# Patient Record
Sex: Female | Born: 1960 | Hispanic: No | Smoking: Current every day smoker
Health system: Southern US, Community
[De-identification: ages and names within clinical notes are randomized; demographics above are authoritative.]

## PROBLEM LIST (undated history)

## (undated) DIAGNOSIS — I10 Essential (primary) hypertension: Secondary | ICD-10-CM

## (undated) DIAGNOSIS — R1314 Dysphagia, pharyngoesophageal phase: Secondary | ICD-10-CM

## (undated) DIAGNOSIS — Z720 Tobacco use: Secondary | ICD-10-CM

## (undated) DIAGNOSIS — G459 Transient cerebral ischemic attack, unspecified: Secondary | ICD-10-CM

## (undated) DIAGNOSIS — E7800 Pure hypercholesterolemia, unspecified: Secondary | ICD-10-CM

## (undated) DIAGNOSIS — U071 COVID-19: Secondary | ICD-10-CM

## (undated) DIAGNOSIS — J449 Chronic obstructive pulmonary disease, unspecified: Secondary | ICD-10-CM

## (undated) DIAGNOSIS — H538 Other visual disturbances: Secondary | ICD-10-CM

## (undated) HISTORY — DX: COVID-19: U07.1

## (undated) HISTORY — PX: OTHER SURGICAL HISTORY: SHX169

## (undated) HISTORY — PX: ECTOPIC PREGNANCY SURGERY: SHX613

## (undated) HISTORY — DX: Chronic obstructive pulmonary disease, unspecified: J44.9

## (undated) HISTORY — DX: Dysphagia, pharyngoesophageal phase: R13.14

## (undated) HISTORY — DX: Other visual disturbances: H53.8

## (undated) HISTORY — PX: CHOLECYSTECTOMY: SHX55

## (undated) HISTORY — DX: Pure hypercholesterolemia, unspecified: E78.00

## (undated) HISTORY — PX: TUBAL LIGATION: SHX77

---

## 1997-07-20 ENCOUNTER — Emergency Department (HOSPITAL_COMMUNITY): Admission: EM | Admit: 1997-07-20 | Discharge: 1997-07-20 | Payer: Self-pay | Admitting: Emergency Medicine

## 1997-09-30 ENCOUNTER — Encounter: Admission: RE | Admit: 1997-09-30 | Discharge: 1997-09-30 | Payer: Self-pay | Admitting: Family Medicine

## 1997-10-09 ENCOUNTER — Encounter: Admission: RE | Admit: 1997-10-09 | Discharge: 1997-10-09 | Payer: Self-pay | Admitting: Family Medicine

## 1997-10-15 ENCOUNTER — Encounter: Admission: RE | Admit: 1997-10-15 | Discharge: 1997-10-15 | Payer: Self-pay | Admitting: Family Medicine

## 1998-06-16 ENCOUNTER — Encounter: Admission: RE | Admit: 1998-06-16 | Discharge: 1998-06-16 | Payer: Self-pay | Admitting: Family Medicine

## 1999-01-23 ENCOUNTER — Encounter: Admission: RE | Admit: 1999-01-23 | Discharge: 1999-01-23 | Payer: Self-pay | Admitting: Family Medicine

## 1999-02-02 ENCOUNTER — Encounter: Admission: RE | Admit: 1999-02-02 | Discharge: 1999-02-02 | Payer: Self-pay | Admitting: Family Medicine

## 1999-02-13 ENCOUNTER — Encounter: Admission: RE | Admit: 1999-02-13 | Discharge: 1999-02-13 | Payer: Self-pay | Admitting: Family Medicine

## 1999-10-16 ENCOUNTER — Encounter: Admission: RE | Admit: 1999-10-16 | Discharge: 1999-10-16 | Payer: Self-pay | Admitting: Family Medicine

## 2000-06-09 ENCOUNTER — Encounter: Admission: RE | Admit: 2000-06-09 | Discharge: 2000-06-09 | Payer: Self-pay | Admitting: Family Medicine

## 2000-06-29 ENCOUNTER — Encounter: Admission: RE | Admit: 2000-06-29 | Discharge: 2000-06-29 | Payer: Self-pay | Admitting: Family Medicine

## 2000-07-20 ENCOUNTER — Encounter: Admission: RE | Admit: 2000-07-20 | Discharge: 2000-07-20 | Payer: Self-pay | Admitting: Family Medicine

## 2000-07-26 ENCOUNTER — Encounter: Admission: RE | Admit: 2000-07-26 | Discharge: 2000-07-26 | Payer: Self-pay | Admitting: Sports Medicine

## 2000-08-10 ENCOUNTER — Encounter: Admission: RE | Admit: 2000-08-10 | Discharge: 2000-08-10 | Payer: Self-pay | Admitting: Family Medicine

## 2000-09-01 ENCOUNTER — Encounter: Admission: RE | Admit: 2000-09-01 | Discharge: 2000-09-01 | Payer: Self-pay | Admitting: Family Medicine

## 2001-01-25 ENCOUNTER — Encounter: Admission: RE | Admit: 2001-01-25 | Discharge: 2001-01-25 | Payer: Self-pay | Admitting: Family Medicine

## 2001-02-04 ENCOUNTER — Encounter (INDEPENDENT_AMBULATORY_CARE_PROVIDER_SITE_OTHER): Payer: Self-pay | Admitting: *Deleted

## 2001-02-04 LAB — CONVERTED CEMR LAB

## 2001-02-06 ENCOUNTER — Encounter: Admission: RE | Admit: 2001-02-06 | Discharge: 2001-02-06 | Payer: Self-pay | Admitting: Family Medicine

## 2001-02-24 ENCOUNTER — Encounter: Admission: RE | Admit: 2001-02-24 | Discharge: 2001-02-24 | Payer: Self-pay | Admitting: Family Medicine

## 2001-04-12 ENCOUNTER — Encounter: Admission: RE | Admit: 2001-04-12 | Discharge: 2001-04-12 | Payer: Self-pay | Admitting: Family Medicine

## 2001-08-29 ENCOUNTER — Encounter: Payer: Self-pay | Admitting: Sports Medicine

## 2001-08-29 ENCOUNTER — Encounter: Admission: RE | Admit: 2001-08-29 | Discharge: 2001-08-29 | Payer: Self-pay | Admitting: Sports Medicine

## 2001-10-29 ENCOUNTER — Emergency Department (HOSPITAL_COMMUNITY): Admission: EM | Admit: 2001-10-29 | Discharge: 2001-10-29 | Payer: Self-pay | Admitting: Emergency Medicine

## 2003-05-15 ENCOUNTER — Encounter: Admission: RE | Admit: 2003-05-15 | Discharge: 2003-05-15 | Payer: Self-pay | Admitting: Sports Medicine

## 2003-05-15 ENCOUNTER — Encounter: Admission: RE | Admit: 2003-05-15 | Discharge: 2003-05-15 | Payer: Self-pay | Admitting: Family Medicine

## 2003-05-22 ENCOUNTER — Encounter: Admission: RE | Admit: 2003-05-22 | Discharge: 2003-05-22 | Payer: Self-pay | Admitting: Sports Medicine

## 2004-04-03 ENCOUNTER — Emergency Department (HOSPITAL_COMMUNITY): Admission: EM | Admit: 2004-04-03 | Discharge: 2004-04-03 | Payer: Self-pay | Admitting: Emergency Medicine

## 2004-04-21 ENCOUNTER — Ambulatory Visit: Payer: Self-pay | Admitting: Internal Medicine

## 2006-03-04 ENCOUNTER — Encounter (INDEPENDENT_AMBULATORY_CARE_PROVIDER_SITE_OTHER): Payer: Self-pay | Admitting: *Deleted

## 2006-11-11 ENCOUNTER — Ambulatory Visit: Payer: Self-pay | Admitting: Family Medicine

## 2006-11-11 DIAGNOSIS — J209 Acute bronchitis, unspecified: Secondary | ICD-10-CM | POA: Insufficient documentation

## 2006-11-17 ENCOUNTER — Encounter (INDEPENDENT_AMBULATORY_CARE_PROVIDER_SITE_OTHER): Payer: Self-pay | Admitting: Family Medicine

## 2008-01-30 ENCOUNTER — Encounter: Payer: Self-pay | Admitting: Family Medicine

## 2008-01-30 ENCOUNTER — Ambulatory Visit: Payer: Self-pay | Admitting: Family Medicine

## 2008-01-30 DIAGNOSIS — N912 Amenorrhea, unspecified: Secondary | ICD-10-CM | POA: Insufficient documentation

## 2008-01-30 DIAGNOSIS — I10 Essential (primary) hypertension: Secondary | ICD-10-CM | POA: Insufficient documentation

## 2008-01-30 LAB — CONVERTED CEMR LAB: Beta hcg, urine, semiquantitative: NEGATIVE

## 2008-01-31 ENCOUNTER — Encounter: Payer: Self-pay | Admitting: Family Medicine

## 2008-01-31 LAB — CONVERTED CEMR LAB
BUN: 9 mg/dL (ref 6–23)
CO2: 22 meq/L (ref 19–32)
Calcium: 9.4 mg/dL (ref 8.4–10.5)
Chloride: 111 meq/L (ref 96–112)
Creatinine, Ser: 0.61 mg/dL (ref 0.40–1.20)
Glucose, Bld: 91 mg/dL (ref 70–99)
Potassium: 4.3 meq/L (ref 3.5–5.3)
Sodium: 144 meq/L (ref 135–145)

## 2008-02-05 ENCOUNTER — Encounter: Admission: RE | Admit: 2008-02-05 | Discharge: 2008-02-05 | Payer: Self-pay | Admitting: Family Medicine

## 2008-02-05 ENCOUNTER — Encounter: Payer: Self-pay | Admitting: Family Medicine

## 2008-02-06 ENCOUNTER — Telehealth: Payer: Self-pay | Admitting: Family Medicine

## 2008-02-08 ENCOUNTER — Encounter: Payer: Self-pay | Admitting: Family Medicine

## 2008-03-01 ENCOUNTER — Ambulatory Visit: Payer: Self-pay | Admitting: Family Medicine

## 2008-03-01 DIAGNOSIS — F17200 Nicotine dependence, unspecified, uncomplicated: Secondary | ICD-10-CM | POA: Insufficient documentation

## 2008-05-31 ENCOUNTER — Encounter (INDEPENDENT_AMBULATORY_CARE_PROVIDER_SITE_OTHER): Payer: Self-pay | Admitting: Neurology

## 2008-05-31 ENCOUNTER — Emergency Department (HOSPITAL_COMMUNITY): Admission: EM | Admit: 2008-05-31 | Discharge: 2008-05-31 | Payer: Self-pay | Admitting: Emergency Medicine

## 2008-05-31 ENCOUNTER — Ambulatory Visit: Payer: Self-pay | Admitting: Family Medicine

## 2008-05-31 ENCOUNTER — Telehealth: Payer: Self-pay | Admitting: Family Medicine

## 2008-05-31 ENCOUNTER — Ambulatory Visit: Payer: Self-pay | Admitting: Cardiology

## 2008-06-04 ENCOUNTER — Encounter: Payer: Self-pay | Admitting: *Deleted

## 2008-06-04 ENCOUNTER — Encounter: Payer: Self-pay | Admitting: Family Medicine

## 2008-06-04 ENCOUNTER — Ambulatory Visit: Payer: Self-pay | Admitting: Family Medicine

## 2008-06-04 ENCOUNTER — Telehealth: Payer: Self-pay | Admitting: Family Medicine

## 2008-06-04 DIAGNOSIS — G459 Transient cerebral ischemic attack, unspecified: Secondary | ICD-10-CM | POA: Insufficient documentation

## 2008-06-04 DIAGNOSIS — E669 Obesity, unspecified: Secondary | ICD-10-CM | POA: Insufficient documentation

## 2008-06-04 LAB — CONVERTED CEMR LAB
Cholesterol: 181 mg/dL (ref 0–200)
HDL: 43 mg/dL (ref >39)
Hgb A1c MFr Bld: 5.8 %
LDL Cholesterol: 110 mg/dL — ABNORMAL HIGH (ref 0–99)
Total CHOL/HDL Ratio: 4.2
Triglycerides: 142 mg/dL (ref <150)
VLDL: 28 mg/dL (ref 0–40)

## 2008-06-10 ENCOUNTER — Encounter: Payer: Self-pay | Admitting: Family Medicine

## 2008-07-16 ENCOUNTER — Ambulatory Visit: Payer: Self-pay | Admitting: Family Medicine

## 2008-07-16 DIAGNOSIS — M25519 Pain in unspecified shoulder: Secondary | ICD-10-CM | POA: Insufficient documentation

## 2008-12-30 ENCOUNTER — Ambulatory Visit: Payer: Self-pay | Admitting: Family Medicine

## 2008-12-30 ENCOUNTER — Encounter: Admission: RE | Admit: 2008-12-30 | Discharge: 2008-12-30 | Payer: Self-pay | Admitting: Family Medicine

## 2008-12-30 DIAGNOSIS — R059 Cough, unspecified: Secondary | ICD-10-CM | POA: Insufficient documentation

## 2008-12-30 DIAGNOSIS — R05 Cough: Secondary | ICD-10-CM

## 2009-01-13 ENCOUNTER — Encounter: Payer: Self-pay | Admitting: Family Medicine

## 2009-01-13 ENCOUNTER — Ambulatory Visit: Payer: Self-pay | Admitting: Family Medicine

## 2009-01-13 LAB — CONVERTED CEMR LAB: Direct LDL: 131 mg/dL — ABNORMAL HIGH

## 2009-01-19 ENCOUNTER — Encounter: Payer: Self-pay | Admitting: Family Medicine

## 2009-05-27 ENCOUNTER — Ambulatory Visit: Payer: Self-pay | Admitting: Family Medicine

## 2009-05-27 ENCOUNTER — Encounter: Payer: Self-pay | Admitting: Sports Medicine

## 2009-05-28 LAB — CONVERTED CEMR LAB
HCT: 43.6 % (ref 36.0–46.0)
Hemoglobin: 14.1 g/dL (ref 12.0–15.0)
INR: 1.02 (ref <1.50)
MCHC: 32.3 g/dL (ref 30.0–36.0)
MCV: 94 fL (ref 78.0–100.0)
Platelets: 319 10*3/uL (ref 150–400)
Prothrombin Time: 13.3 s (ref 11.6–15.2)
RBC: 4.64 M/uL (ref 3.87–5.11)
RDW: 14.9 % (ref 11.5–15.5)
TSH: 1.468 microintl units/mL (ref 0.350–4.500)
WBC: 7.4 10*3/uL (ref 4.0–10.5)
aPTT: 31 s

## 2009-06-03 ENCOUNTER — Encounter: Payer: Self-pay | Admitting: Family Medicine

## 2009-06-06 ENCOUNTER — Telehealth: Payer: Self-pay | Admitting: Family Medicine

## 2009-06-09 ENCOUNTER — Ambulatory Visit: Payer: Self-pay | Admitting: Family Medicine

## 2009-06-16 ENCOUNTER — Encounter: Payer: Self-pay | Admitting: Family Medicine

## 2009-07-02 ENCOUNTER — Ambulatory Visit: Payer: Self-pay | Admitting: Family Medicine

## 2009-07-02 DIAGNOSIS — N926 Irregular menstruation, unspecified: Secondary | ICD-10-CM | POA: Insufficient documentation

## 2010-01-25 ENCOUNTER — Encounter: Payer: Self-pay | Admitting: Family Medicine

## 2010-01-28 ENCOUNTER — Encounter: Payer: Self-pay | Admitting: Family Medicine

## 2010-01-28 ENCOUNTER — Ambulatory Visit: Admission: RE | Admit: 2010-01-28 | Discharge: 2010-01-28 | Payer: Self-pay | Source: Home / Self Care

## 2010-01-28 ENCOUNTER — Other Ambulatory Visit: Payer: Self-pay | Admitting: Family Medicine

## 2010-01-28 ENCOUNTER — Telehealth: Payer: Self-pay | Admitting: Family Medicine

## 2010-01-28 DIAGNOSIS — R5381 Other malaise: Secondary | ICD-10-CM | POA: Insufficient documentation

## 2010-01-28 DIAGNOSIS — R5383 Other fatigue: Secondary | ICD-10-CM

## 2010-01-28 DIAGNOSIS — G4730 Sleep apnea, unspecified: Secondary | ICD-10-CM | POA: Insufficient documentation

## 2010-01-28 LAB — CONVERTED CEMR LAB
HCT: 41.9 % (ref 36.0–46.0)
Hemoglobin: 14.4 g/dL (ref 12.0–15.0)
MCHC: 34.4 g/dL (ref 30.0–36.0)
MCV: 92.9 fL (ref 78.0–100.0)
Platelets: 319 10*3/uL (ref 150–400)
RBC: 4.51 M/uL (ref 3.87–5.11)
RDW: 14.5 % (ref 11.5–15.5)
WBC: 6.8 10*3/uL (ref 4.0–10.5)

## 2010-01-29 ENCOUNTER — Telehealth: Payer: Self-pay | Admitting: *Deleted

## 2010-01-30 ENCOUNTER — Telehealth: Payer: Self-pay | Admitting: *Deleted

## 2010-02-02 ENCOUNTER — Telehealth: Payer: Self-pay | Admitting: *Deleted

## 2010-02-03 NOTE — Assessment & Plan Note (Signed)
Summary: f/u last visit/eo   Vital Signs:  Patient profile:   50 year old female Height:      60.0 inches Weight:      162.6 pounds BMI:     31.87 Temp:     98.1 degrees F Pulse rate:   83 / minute BP sitting:   123 / 82  (left arm)  Vitals Entered By: Starleen Blue RN (July 16, 2008 2:26 PM) CC: htn, shoulder pain Is Patient Diabetic? No Pain Assessment Patient in pain? no        Primary Care Provider:  Jamie Brookes  CC:  htn and shoulder pain.  History of Present Illness: hypertension: Pt has had some elevated BP's and has recently had a TIA. She came in to discuss her blood pressure and cholesterol. She has been taking daily BP's and has ranged between SBP of 112-166. Most of her BP's are in the 120's. SHe says that she is not eating any red meat, takes a multi-vitamin, Vit D, Vit E, and fish oil. She uses apedometer occasionally and knows that she gets more than 10,000 steps a day when she works (which is anywhere from 3-7 days a week). Pt is very serious about not wanting to be on medications. She would like to try a natural way to reduce her cholesterol. I mentioned Red Yeast Rice to her and she will consider taking it.    Should pain: Pt mentions that she has been sleeping on her shoulder and it has been hurting a lot. Explained to patient that sleeping on her back is the best way to keep from putting too much pressure on any one body part. Encouraged pt to use ibuprofen when aching.   Smoking: Counseled pt to cut down smoking and eventualy stop completely. Pt has decreased her smoking some but is not interested in stopping at this time. Offered support.   Habits & Providers  Alcohol-Tobacco-Diet     Tobacco Status: current     Cigarette Packs/Day: 0.25  Current Medications (verified): 1)  Multivitamins  Caps (Multiple Vitamin) .[name].. 1 Daily 2)  Vitamin D 1000 Unit Tabs (Cholecalciferol) .... Take Once Daily 3)  Calcium 500 Mg Chew (Calcium Carbonate) .... Take  800mg  Daily  Allergies (verified): No Known Drug Allergies  Past History:  Past Medical History: Last updated: 06/04/2008 copd exacerbations, [id number], multiple clinic visits for bronchitis,  TIA 05-31-08, ECHO wnl, MRI wnl, carotid dopples ?  Social History: Last updated: 01/30/2008 works as a Financial risk analyst; 1-3 beer/wk; joint/day; 47 pack year; 1/2 ppd  Risk Factors: Smoking Status: current (07/16/2008) Packs/Day: 0.25 (07/16/2008)  Social History: Packs/Day:  0.25  Review of Systems       neg except as noted in HPI  Physical Exam  General:  Well-developed,well-nourished,in no acute distress; alert,appropriate and cooperative throughout examination Lungs:  Normal respiratory effort, chest expands symmetrically. Lungs are clear to auscultation, no crackles or wheezes. Heart:  Normal rate and regular rhythm. S1 and S2 normal without gallop, murmur, click, rub or other extra sounds. Psych:  Cognition and judgment appear intact. Alert and cooperative with normal attention span and concentration. No apparent delusions, illusions, hallucinations   Impression & Recommendations:  Problem # 1:  ELEVATED BP READING WITHOUT DX HYPERTENSION (ICD-796.2) Assessment Improved PT has had better blood pressures since our last visit. She wants to further decrease her risk of CVD by getting her cholesterol down to 70. SHe would like to do this naturally. Suggested REd Yeast Rice. Will plan  to recheck cholesterol in 2 months. If not better plan to start pt on statin. Cont taking BP's.  Orders: [facility]- Est  Level 4 ([address]109)  Problem # 2:  SHOULDER PAIN, RIGHT (ICD-719.41) Assessment: New Pt is having new shoulder pain. She has been sleeping on her arm. Suggested sleeping on back and taking motrin. PT still has range of motion but has pain when she lifts her arms above her head.   Orders: [facility]- Est  Level 4 ([address]454)  Complete Medication List: 1)  Multivitamins Caps (Multiple vitamin) .[name].. 1 daily 2)   Vitamin D 1000 Unit Tabs (Cholecalciferol) .... Take once daily 3)  Calcium 500 Mg Chew (Calcium carbonate) .... Take 800mg  daily  Patient Instructions: 1)  I will put in an order for a fasting lipid panal in 2 months. 2)  Please make a lab appointment for 2 months. 3)  Try Red Yeast Rice to lower cholesterol natrually since you are not interested in meds at this time. 4)  I will call with results.

## 2010-02-03 NOTE — Letter (Signed)
Summary: Generic Letter  [facility]  [address]   [place], [place] [address]   Phone: [phone]  Fax: [phone]    02/05/2008  LIND AUSLEY [address] [place], [place]  [address]  Dear Ms. Ohanian,  It was nice to meet you last week. All your lab results came back normal. Your pap smear showed no signs of cancer cells. I hope you have a wonderful week.    Sincerely,   AMBER STROTHER MD [facility]

## 2010-02-03 NOTE — Progress Notes (Signed)
Summary: triage  Phone Note Call from Patient Call back at Home Phone [phone] Call back at [phone] ext [phone]   Caller: Patient Summary of Call: Pt was given medication to stop bleeding it did work but now she is bleeding again. Initial call taken by: Clydell Hakim,  June 06, 2009 3:39 PM  Follow-up for Phone Call        the provera stopped it but she just started bleeding again 45 min ago. not heavy flow. does not want to go to UC as co pay is too high. will hold off until Monday at 8:30. has to be at work at 10. advised lots of fluids and red meat and rest. she is off this weekend. go [facility] if the bleeding is enough to saturate a pad every 45 minutes or she developes severe pain. she agreed with plan Follow-up by: Golden Circle RN,  June 06, 2009 3:52 PM  Additional Follow-up for Phone Call Additional follow up Details #1::        thanks for getting her seen today.  Additional Follow-up by: Jamie Brookes MD,  June 09, 2009 4:37 PM

## 2010-02-03 NOTE — Miscellaneous (Signed)
Summary: [facility] ultrasound  Clinical Lists Changes Diane from [facility] called to report that pt dnka her appt for an ultrasound...Marland KitchenMarland KitchenGolden Circle RN  Jun 03, 2009 4:37 PM  Spoke with patient. she completely forgot about the appointment. She has quit bleeding. I recommended that she still have the ultrasound. She gets her new schedule tomorrow. She will be able to know when she can go in after 10 am tomorrow. I will have our office call her in the am. Jamie Brookes MD  Jun 03, 2009 7:07 PM    Appended Document: [facility] ultrasound left vm for pt to call back

## 2010-02-03 NOTE — Assessment & Plan Note (Signed)
Summary: bronchitis wp   Vital Signs:  Patient Profile:   50 Years Old Female Height:     60 inches Weight:      164.2 pounds BMI:     32.18 Temp:     98 degrees F Pulse rate:   101 / minute BP sitting:   155 / 88  Pt. in pain?   no  Vitals Entered By: Ishmael Holter                  Chief Complaint:  cough x 2 weeks.  History of Present Illness: Bronchitis for 2 weeks, still smokiing 1 PPD with no plans on quiting or slowing down.  Wheezing, cannot afford meds, no health insurance.  Can go to [facility].  Deneis fever, + sputum production.        Risk Factors:  Tobacco use:  current    Counseled to quit/cut down tobacco use:  yes    Physical Exam  General:     Coughing non stop, hoarse quality to voice, pale Ears:     R ear normal and L ear normal.   Mouth:     poor dentition.   Lungs:     R wheezes and L wheezes.   Heart:     normal rate, regular rhythm, and no murmur.      Impression & Recommendations:  Problem # 1:  ACUTE BRONCHITIS (ICD-466.0) Likely chronic problem with tobacco use and viral infection causing acute flair.  Gave albuterol neb,  wrote for [facility] but she doubted she could afford to fill.  Prednisone 40 mg daily for 5 days, doxycycline 100 two times a day for 10 days. Orders: [facility]- Est Level  3 ([address]109)    Patient Instructions: 1)  Tobacco is very bad for your health and your loved ones! You Should stop smoking!. 2)  Stop Smoking Tips: Choose a Quit date. Cut down before the Quit date. decide what you will do as a substitute when you feel the urge to smoke(gum,toothpick,exercise).    ]

## 2010-02-03 NOTE — Assessment & Plan Note (Signed)
Summary: cough x 1 month   Vital Signs:  Patient profile:   50 year old female Weight:      160.5 pounds Temp:     97.6 degrees F oral Pulse rate:   79 / minute BP sitting:   125 / 79  (right arm) Cuff size:   regular  Vitals Entered By: Loralee Pacas CMA (December 30, 2008 1:31 PM) CC: coughing, stopped up, fatigued ears and throat scratchy x 2 months   Primary Care Provider:  Jamie Brookes  CC:  coughing, stopped up, and fatigued ears and throat scratchy x 2 months.  History of Present Illness: Pt has been having a cough, congestion and fatigue since just before Thanksgiving. Her husband was sick and she caught his cold and has not gotten rid of it since. She continues to smoke about 7-10 cigarettes per day and says she has had bronchitis inections before. She has also been on prednisone and inhalers because of COPD in the past. She says she has been having some chills and sweating but has not docmented a fever. She says her coughing is so forceful that it makes her urinate. She is sore and achy and has missed 3 days of work because of her fatigue. She says her mucus has been clear from her nose and from her cough.     Habits & Providers  Alcohol-Tobacco-Diet     Tobacco Status: current     Tobacco Counseling: to quit use of tobacco products     Cigarette Packs/Day: 7-10 cigs per day  Current Medications (verified): 1)  Multivitamins  Caps (Multiple Vitamin) .[name].. 1 Daily 2)  Vitamin D 1000 Unit Tabs (Cholecalciferol) .... Take Once Daily 3)  Calcium 500 Mg Chew (Calcium Carbonate) .... Take 800mg  Daily  Allergies (verified): No Known Drug Allergies  Social History: Packs/Day:  7-10 cigs per day  Review of Systems        vitals reviewed and pertinent negatives and positives seen in HPI   Physical Exam  General:  Well-developed,well-nourished,in no acute distress; alert,appropriate and cooperative throughout examination Head:  pt reports no tenderness or tooth  pain.  Mouth:  Oral mucosa and oropharynx without lesions or exudates.  Teeth in fair repair.  Neck:  No deformities, masses, or tenderness noted. Lungs:  diffuse exp wheezing- worse in post lung fields, no crackles, normal resp effort Heart:  Normal rate and regular rhythm. S1 and S2 normal without gallop, murmur, click, rub or other extra sounds.   Impression & Recommendations:  Problem # 1:  COUGH (ICD-786.2) Assessment New Pt has had a cough for a little over a month. Her CXR did not show any lesions. Discussed with patient and she would prefer to wait a few more days to see if she improves. If she starts to get worse she will call the office.   Orders: CXR- 2view (CXR) [facility]- Est Level  3 (16109)  Problem # 2:  TOBACCO ABUSE (ICD-305.1) Assessment: Improved Pt is now only smoking 7-10 cigarettes a day. She has cut back but continues to have difficulty quitting because she says her husband smokes around her. Knows she needs to someday but is not at the action stage yet.   Orders: [facility]- Est Level  3 (60454)  Complete Medication List: 1)  Multivitamins Caps (Multiple vitamin) .[name].. 1 daily 2)  Vitamin D 1000 Unit Tabs (Cholecalciferol) .... Take once daily 3)  Calcium 500 Mg Chew (Calcium carbonate) .... Take 800mg  daily  Patient Instructions: 1)  I could be that this is all a virus causing your problems in which case lots of water and rest will be the only things to impove it.  2)  I could also be an atypical pneumonia or COPD. Let's get an x-ray to find out.  3)  I will call you with results.

## 2010-02-03 NOTE — Assessment & Plan Note (Signed)
Summary: continued bleeding/[place]/strother   Vital Signs:  Patient profile:   50 year old female Weight:      165.6 pounds Pulse rate:   65 / minute BP sitting:   126 / 81  (right arm)  Vitals Entered By: Arlyss Repress CMA, (June 09, 2009 8:29 AM) CC: f/up irregular menses. Is Patient Diabetic? No Pain Assessment Patient in pain? no        Primary Care Provider:  Jamie Brookes  CC:  f/up irregular menses..  History of Present Illness: Patient was seen 5/24 for prolonged mensese (14 days).  Given 7 day course of provera, which she strted that day.  On day 3 her bleeding stopped.  Approx 3-4 days after finishing the course, she started to bleed again (on Friday).  Light flow.  Called Fri for appt but couldn't be seen then so made appt for today (Monday).  Bleeding stopped by Saturday morning and has had no further bleeding.  No complaints today.  States appt was too early in the morning for her to cancel or she wouuld have as she has no problems or complaints now.  Labs checked at the 5/24 visit were reviewed with the patient - all normal.   Habits & Providers  Alcohol-Tobacco-Diet     Tobacco Status: current     Tobacco Counseling: to quit use of tobacco products     Cigarette Packs/Day: 0.5  Allergies: No Known Drug Allergies  Physical Exam  General:  overweight, alert, NAD vitals reviewed Psych:  Oriented X3, memory intact for recent and remote, and normally interactive.     Impression & Recommendations:  Problem # 1:  DYSFUNCTIONAL UTERINE BLEEDING (ICD-626.8) Assessment Improved  Resolved. Slight bleeding likely from the cessation of the provera.  Discussed bleeding often becomes irregular around menopause.  Due for CPE and pap.  Will schedule at her earliest convenience with her PCP.   Orders: [facility]- Est Level  3 ([address])  Complete Medication List: 1)  Multivitamins Caps (Multiple vitamin) .[name].. 1 daily 2)  Vitamin D 1000 Unit Tabs (Cholecalciferol) .... Take  once daily 3)  Calcium 500 Mg Chew (Calcium carbonate) .... Take 800mg  daily 4)  Provera 10 Mg Tabs (Medroxyprogesterone acetate) .... One tab by mouth daily x 7 days

## 2010-02-03 NOTE — Assessment & Plan Note (Signed)
Summary: prolonged mentral,tcb   Vital Signs:  Patient profile:   50 year old female Weight:      160.4 pounds Temp:     98.1 degrees F oral Pulse rate:   83 / minute Pulse rhythm:   regular BP sitting:   117 / 82  (left arm) Cuff size:   regular  Vitals Entered By: Loralee Pacas CMA (May 27, 2009 1:32 PM) CC: still bleeding Is Patient Diabetic? No Comments pt states that she has been bleeding for 14 days   Primary Care Provider:  Jamie Brookes  CC:  still bleeding.  History of Present Illness: Bleeding for 14 days, usual is 7, on and off but some blood/clots passed at least daily, no pain, no dizziness/SOB/presyncope, no fevers/chills, no dysuria, s/p BTL.  1-2 pads a day.  No cramping.  Starting to get hot flashes, mood changes.  No aspirin/motrin use.  No hx bleeding or clotting problems.  Habits & Providers  Alcohol-Tobacco-Diet     Tobacco Status: current     Tobacco Counseling: to quit use of tobacco products     Cigarette Packs/Day: 0.5  Current Medications (verified): 1)  Multivitamins  Caps (Multiple Vitamin) .[name].. 1 Daily 2)  Vitamin D 1000 Unit Tabs (Cholecalciferol) .... Take Once Daily 3)  Calcium 500 Mg Chew (Calcium Carbonate) .... Take 800mg  Daily 4)  Provera 10 Mg Tabs (Medroxyprogesterone Acetate) .... One Tab By Mouth Daily X 7 Days  Allergies (verified): No Known Drug Allergies  Past History:  Past Medical History: Last updated: 06/04/2008 copd exacerbations, [id number], multiple clinic visits for bronchitis,  TIA 05-31-08, ECHO wnl, MRI wnl, carotid dopples ?  Social History: Packs/Day:  0.5  Review of Systems       See HPI  Physical Exam  General:  Well-developed,well-nourished,in no acute distress; alert,appropriate and cooperative throughout examination Lungs:  Normal respiratory effort, chest expands symmetrically. Lungs are clear to auscultation, no crackles or wheezes. Heart:  Normal rate and regular rhythm. S1 and S2 normal without  gallop, murmur, click, rub or other extra sounds. Abdomen:  Bowel sounds positive,abdomen soft and non-tender without masses, organomegaly or hernias noted. Genitalia:  Normal introitus for age, no external lesions, no vaginal discharge, mucosa pink and moist, no vaginal or cervical lesions, no vaginal atrophy, no friaility or hemorrhage, 20 WEEK UTERUS that is very firm, no adnexal masses or tenderness  Some blood in vault/clots, but very little. Extremities:  No clubbing, cyanosis, edema, or deformity noted with normal full range of motion of all joints.   Skin:  Intact without suspicious lesions or rashes Psych:  Cognition and judgment appear intact. Alert and cooperative with normal attention span and concentration. No apparent delusions, illusions, hallucinations   Impression & Recommendations:  Problem # 1:  DYSFUNCTIONAL UTERINE BLEEDING (ICD-626.8) Assessment New Checking the below labs, very large/firm uterus without hx fibroids.  Checking [place].  Provera 10mg  qd x 7d to stop bleeding.  Orders: CBC-[facility] ([address]147) TSH-[facility] ([address]2956-21308) PTT-[facility] ([id number]) INR/PT-[facility] (52841) [facility]- Est  Level 4 (32440) Ultrasound (Ultrasound)  Complete Medication List: 1)  Multivitamins Caps (Multiple vitamin) .[name].. 1 daily 2)  Vitamin D 1000 Unit Tabs (Cholecalciferol) .... Take once daily 3)  Calcium 500 Mg Chew (Calcium carbonate) .... Take 800mg  daily 4)  Provera 10 Mg Tabs (Medroxyprogesterone acetate) .... One tab by mouth daily x 7 days  Patient Instructions: 1)  Labs 2)  Ultrasound 3)  Provera daily for 7 days 4)  Come back if no improvement. 5)  -  Dr. Karie Schwalbe. Prescriptions: PROVERA 10 MG TABS (MEDROXYPROGESTERONE ACETATE) One tab by mouth daily x 7 days  #7 x 0   Entered and Authorized by:   Rodney Langton MD   Signed by:   Rodney Langton MD on 05/27/2009   Method used:   Electronically to        [facility] [phone]* (retail)       [address]       [place],  [place]  [address]       Ph: [phone]       Fax: [phone]   RxID:   [id number]

## 2010-02-03 NOTE — Letter (Signed)
Summary: Results Follow-up Letter  [facility]  [address]   [place], [place] [address]   Phone: [phone]  Fax: [phone]    06/10/2008  [address] [place], [place]  [address]  Dear Ms. Karel,   The following are the results of your recent test(s):  Test            Result      Cholesterol LDL(Bad cholesterol):    110     Your goal is less than: less than 70       HDL (Good cholesterol):   43     Your goal is more than: more than 40   Your cholesterol goal is less than most people because you have had that TIA (or mini-stroke). Eating a diet low is saturated fat (butter, meat, dairy, etc...) and high in vegetables combined with stopping to smoke will help you to decrease your cholesterol and overall vascular health. You don't have to start a medicine at this point but I would recommend starting a Fish Oil supplement for lowering your LDL cholesterol and decreasing inflamation. Call our office your make an appointment if you have any questions.    Sincerely,  Jamie Brookes MD [facility]           Appended Document: Results Follow-up Letter Letter sent via mail to pt ..................Marland KitchenDelores Pate-Gaddy, CMA (AAMA)

## 2010-02-03 NOTE — Letter (Signed)
Summary: Results Follow Up Letter  [facility]  [address]   [place], [place] [address]   Phone: [phone]  Fax: [phone]    02/08/2008 MRN: [id number]  [address] [place], [place]  [address]  Dear Ms. Alonzo,   The following are the results of your recent test(s):  Your recent mammogram was normal. The official reading was as follows:   IMPRESSION:     No specific mammographic evidence of malignancy.  Next screening      mammogram is recommended in one      year.  You may contact my office at [phone] if you have any questions.     _________________________________________________________ _________________________________________________________ _________________________________________________________  Sincerely,  Jamie Brookes MD [facility]

## 2010-02-03 NOTE — Assessment & Plan Note (Signed)
Summary: F/U BP/EO   Vital Signs:  Patient Profile:   50 Years Old Female Height:     60 inches Weight:      168.1 pounds BMI:     32.95 Temp:     98.0 degrees F oral Pulse rate:   75 / minute BP sitting:   133 / 76  (left arm)  Vitals Entered By: Alphia Kava (March 01, 2008 8:25 AM)             Is Patient Diabetic? No     PCP:  Magazine features editor, Retail banker Complaint:  f/u BP, Right hip pain, obesity, and tobacco dependence.  History of Present Illness: Blood Pressure: Pt has a BP of 133/76 today. She is feeling well. She does not want to be on any medications and says that she is exercising some at work.   Obesity: Pt is overweight with BMI of 32. She says that she exercises at work. I suggested to her to get a pedometer and try to get 10,000 steps a day. She likes the idea and says she will try. Pt advised on where to wear the pedometer and also given an exercise prescription with a goal of 30 min, 5 days a week. Pt has lost about 5 lbs since our last visit.   Right Hip Pain: Pt was having right hip pain on our prior visit but she says that she has not had any further pain since she started using her [employer] shoes.   Tobacco Dependence: Pt is still smoking. Pt couseled to cut down. Pt smelled heavily of smoke. She has cut down some but was encouraged to continue to cut down.       Risk Factors:     Counseled to quit/cut down tobacco use:  yes  Flu Vaccine Next Due:  Refused   Review of Systems       See HPI   Physical Exam  General:     overweight, in no acute distress; alert,appropriate and cooperative throughout examination Msk:     pt has normal gait. Does not appear to be in pain.     Impression & Recommendations:  Problem # 1:  OVERWEIGHT (ICD-278.02) Assessment: Improved Pt doing well. She has lost about 5 lbs. Pt given exercise prescription and couseled to add fiber to her diet.   Orders: [facility]- Est Level  3 ([address]213)   Problem # 2:  ELEVATED  BP READING WITHOUT DX HYPERTENSION (ICD-796.2) Assessment: Improved Pt had a good BP today (133/76). She has been exercising. Pt encouraged to keep up the good work. Will not need to start med at this time.  Orders: [facility]- Est Level  3 ([address]109)   Problem # 3:  TOBACCO ABUSE (ICD-305.1) Assessment: Unchanged Pt continues to smoke. Pt encouraged to cut down on the smoking. Pt has smoked since she was 50 y/o.   Orders: [facility]- Est Level  3 ([address])    Patient Instructions: 1)  Your BP is doing well today. IT is 133/76. 2)  Keep up the good work with the eating vegetables and decreasing the cheese in your diet. Please eat as much fiber as you can. Look for items with 5 grams of fiber per serving.  3)  Foods like beans have plenty of fiber in them.  4)  It was good to see you today.

## 2010-02-03 NOTE — Assessment & Plan Note (Signed)
Summary: ED consult- TIA  NAME:  Betty Chapman, STORR           ACCOUNT NO.:  [id number]      MEDICAL RECORD NO.:  [id number]          PATIENT TYPE:  EMS      LOCATION:  [facility]                         FACILITY:  [facility]      PHYSICIAN:  Leighton Roach McDiarmid, M.D.DATE OF BIRTH:  [birth year]      DATE OF CONSULTATION:  05/31/2008   DATE OF DISCHARGE:  05/31/2008                                    CONSULTATION      CHIEF COMPLAINT:  Right-sided numbness.      PRIMARY CARE PHYSICIAN:  Jamie Brookes, MD of [facility]      HISTORY OF PRESENT ILLNESS:  The patient is an otherwise  healthy 50   year old female with history of tobacco abuse who presents after   awakening on the morning of evaluation with right-sided numbness.  No   other neurologic problems.  No prior history of similar symptoms.  The   patient denies headache, chest pain, shortness of breath, palpitations.   The patient outside of window for tPA treatment.  The patient was   evaluated by Dr. Vickey Huger of Neurology and had workup completed stat in   anticipation of 3 day weekend including MRI, MRA, carotid Dopplers and   2D echocardiogram.      PAST MEDICAL HISTORY:  Borderline high blood pressure, ectopic   pregnancy.      ALLERGIES:  No known drug allergies.      MEDICATIONS:  Echinacea.      PAST SURGICAL HISTORY:  BTL and cholecystectomy.      SOCIAL HISTORY:  The patient is married.  Positive tobacco use.  The   patient denies alcohol and drugs.      REVIEW OF SYSTEMS:  As per HPI, otherwise  negative.      PHYSICAL EXAMINATION:  VITAL SIGNS:  Temperature 98, blood pressure   147/87, heart rate 78, respiratory rate 20, O2 saturation 99% on room   air.   GENERAL:  In no apparently distress, awake, alert and tearful.   HEENT:  Mucous membranes are moist.  Extraocular muscles intact.  Pupils   equal, round, reactive to light and accommodation.  No bruits.   RESPIRATORY:  Clear to auscultation  bilaterally.   CARDIOVASCULAR:  Regular rate and rhythm.  No murmurs, rubs or gallops.   ABDOMEN:  Obese, nontender, positive bowel sounds.   EXTREMITIES:  No edema, 2+ distal pulses.   NEUROLOGIC:  The patient with cranial nerves II through XII grossly   intact.  No facial droop noted.  Paresthesia of right upper and lower   extremities with intact strength 5 out of 5 in bilateral upper and lower   extremities.      LABORATORY DATA:  Magnesium 1.9, sodium 140, potassium 4.1, chloride   109, bicarb 23, glucose 80, BUN 9, creatinine 0.45, calcium 9, white   blood cell count 7.2, hemoglobin 14.5, hematocrit 42.8, platelets 272.   Preliminary read on carotid Dopplers without  ICA stenosis bilaterally.   MRI of the brain normal.  Head CT  with questionable hypodensity in the   medial left thalamus, otherwise  normal.      ASSESSMENT AND PLAN:   1. Transient ischemic attack:  Discussed case with Dr.  Vickey Huger,  who       given MRI findings, feels like the patient's symptoms consistent       with transient ischemic attack.  The patient completed majority of       workup already including MRI, carotid Dopplers, 2D echocardiogram.       MRI was negative for  any sign of infarct.  Preliminary Dopplers       did not show stenosis.  Therefore Dr. Vickey Huger was okay with the       patient going home being that she does not have any weakness, was       able to ambulate, able to swallow and the patient expressed       hesitancy to stay overnight for observation given some tension       between she and her husband.  She was  agreeable to discharge home       with close followup.  She is to followup at [facility] on Tuesday, June 1 at 8:30 for fasting lipid panel and to       discuss test results.  The patient is to take a daily aspirin in       the interim and was counseled to stop smoking.  This was discussed       with Dr.  McDiarmid, who was agreeable to this as well.  The        patient was given extensive counseling on warning signs to look for       repeat TIA or stroke including slurred speech, weakness, paralysis,       increased paresthesias, etc.   2. Tobacco abuse.  The patient has been counseled to stop smoking.   3. Borderline hypertension.  Will defer to the patient's primary care       physician for management.  Would not decrease acutely given TIA.      DISPOSITION:  The patient was discharged home.      [facility].m[facility]   for labs and appointment.      DISCHARGE CONDITION:  Stable.      DISCHARGE MEDICATIONS:  Aspirin 81 mg p.o. daily.               Lauro Franklin, MD   Electronically Signed               Leighton Roach McDiarmid, M.D.            TCB/MEDQ  D:  06/01/2008  T:  06/01/2008  Job:  [id number]

## 2010-02-03 NOTE — Progress Notes (Signed)
  Pt was called on her cell phone at number [phone]. I left a message letting her know that her mammogram results were normal. If she has any questions she will call back.  Visual merchandiser

## 2010-02-03 NOTE — Letter (Signed)
Summary: Handout Printed  Printed Handout:  - Uterine Bleeding, Dysfunctional

## 2010-02-03 NOTE — Assessment & Plan Note (Signed)
Summary: irregular menses, Rt. shoulder pain, tobacco abuse   Vital Signs:  Patient profile:   50 year old female Weight:      162.6 pounds Temp:     98.2 degrees F oral Pulse rate:   84 / minute Pulse rhythm:   regular BP sitting:   112 / 72  (left arm) Cuff size:   large  Vitals Entered By: Loralee Pacas CMA (July 02, 2009 9:29 AM) CC: irregular menses, Rt shoulder pain, Smoking Comments pt did not have ultrasound done. states thats she is having right shoulder pain   Primary Care Provider:  Jamie Brookes  CC:  irregular menses, Rt shoulder pain, and Smoking.  History of Present Illness: Irregular menses: Pt has been doing well except she is having irregular menstration. She was suppose to go get an US done after her last visit but she forgot about the appointment and never went. She says the bleding cleared after taking Provera for 7 days. She stopped bleeding after day 3 of the Provera and then started bleeding about 3 days after stopping the Provera.   Rt shoulder pain: Pt has been having some Rt shoulder pain especially when she is chopping veggies at work. She says that it ususally calms down while she is off for the weekend but it has not calmed down yet.    Smoking: Pt continues to smoke. She is smoking about 1/2 ppd. She says sometimes it is less when she is at work and can't smoke as much. She has a lot of reasons she needs to quit but says for now she enjoys it and does not want to quit. Pt occasionally needs inhalers.   Habits & Providers  Alcohol-Tobacco-Diet     Tobacco Status: current     Tobacco Counseling: to quit use of tobacco products     Cigarette Packs/Day: 0.5  Current Medications (verified): 1)  Multivitamins  Caps (Multiple Vitamin) .[name].. 1 Daily 2)  Vitamin D 1000 Unit Tabs (Cholecalciferol) .... Take Once Daily 3)  Calcium 500 Mg Chew (Calcium Carbonate) .... Take 800mg  Daily 4)  Echinacea 400 Mg Caps (Echinacea) .... Take 800 Mg A Day 5)   Ventolin Hfa 108 (90 Base) Mcg/act Aers (Albuterol Sulfate) .... Take 2 Puffs Inhaled Up To Every 4-6 Hours.  Allergies (verified): No Known Drug Allergies  Review of Systems        vitals reviewed and pertinent negatives and positives seen in HPI   Physical Exam  General:  Well-developed,well-nourished,in no acute distress; alert,appropriate and cooperative throughout examination Lungs:  Normal respiratory effort, chest expands symmetrically. Lungs are clear to auscultation, no crackles or wheezes. Heart:  Normal rate and regular rhythm. S1 and S2 normal without gallop, murmur, click, rub or other extra sounds. Msk:  left shoulder tenderness. Pt has excellent strength. ROM only slightly decreased because of pain.    Impression & Recommendations:  Problem # 1:  IRREGULAR MENSES (ICD-626.4) Assessment Deteriorated Pt is still having irregular menses. Suspect that she is going through menopause. She has finished the provera. She will let us know if she has problems with bleeding again. Would reconsider doing [place] referral if irregular bleeding starts up again.   The following medications were removed from the medication list:    Provera 10 Mg Tabs (Medroxyprogesterone acetate) ..... One tab by mouth daily x 7 days  Orders: [facility]- Est Level  3 ([address])  Problem # 2:  SHOULDER PAIN, RIGHT (ICD-719.41) Assessment: New Pt having some Rt shoulder  pain with repeated movement. Advised ice, motrin, tumeric (for antiiflammation) as pt does not like to take meds. RTC in 2 weeks if still having pain.   Orders: [facility]- Est Level  3 (16109)  Problem # 3:  TOBACCO ABUSE (ICD-305.1) Assessment: Unchanged Pt continues to smoke 0.5 ppd. Not interested in stopping at this time. Given handout for smoking cessation class.   Orders: [facility]- Est Level  3 (60454)  Complete Medication List: 1)  Multivitamins Caps (Multiple vitamin) .[name].. 1 daily 2)  Vitamin D 1000 Unit Tabs (Cholecalciferol) .... Take once  daily 3)  Calcium 500 Mg Chew (Calcium carbonate) .... Take 800mg  daily 4)  Echinacea 400 Mg Caps (Echinacea) .... Take 800 mg a day 5)  Ventolin Hfa 108 (90 Base) Mcg/act Aers (Albuterol sulfate) .... Take 2 puffs inhaled up to every 4-6 hours.  Patient Instructions: 1)  consider the smoking class  2)  Pick up the inhaler in about 1 hour 3)  Take Tumeric daily for anti-inflammation.  4)  For the next 7 days: take 600 mg of Motrin every 6 hours.  5)  Come back in 2 weeks if you are still having shoulder pain.  Prescriptions: VENTOLIN HFA 108 (90 BASE) MCG/ACT AERS (ALBUTEROL SULFATE) take 2 puffs inhaled up to every 4-6 hours.  #1 x 5   Entered and Authorized by:   Jamie Brookes MD   Signed by:   Jamie Brookes MD on 07/02/2009   Method used:   Electronically to        [facility] [address] [phone]* (retail)       [address]       [place], [place]  [address]       Ph: [phone]       Fax: [phone]   RxID:   [id number]

## 2010-02-03 NOTE — Assessment & Plan Note (Signed)
Summary: ED f/u/AC   Vital Signs:  Patient profile:   50 year old female Height:      60 inches Weight:      162.1 pounds BMI:     31.77 Temp:     98.6 degrees F Pulse rate:   69 / minute BP sitting:   139 / 85  (right arm)  Vitals Entered By: Jacki Cones RN (June 04, 2008 9:09 AM) CC: f/u ED for TIA, smoking cessation, nutrition Pain Assessment Patient in pain? no        Primary Care Provider:  Jamie Brookes  CC:  f/u ED for TIA, smoking cessation, and nutrition.  History of Present Illness: Pt was at the ED 4 days ago with a TIA. She insisted on going home even though she was told she had a TIA and needed to stay for observation. Please see ED D/C summary for futher details. Pt comes in today to have a FLP and HgA1c drawn as these tests were not done in the ED. She did have a CBC and BMET which were normal, CT scan which showed medial left thalamus hypodensity possibly indicating a small vessel infarct and a MRI which was normal. She was seen by Neurologist Dr. Vickey Huger who was able to expidite an ECHO and carotid dopplers. The ECHO was negative and the carotid dopplers are yet to be read but [facility] is to fax the final results when it is read.  Pt is still having some minor sensation differences in on the right side of her body. She says that she has felt off balance once but otherwise is walking well. I spent > 30 minutes talking with the patient about stroke, cholesterol and smoking cessation. Pt understands that she needs to stop smoking but is worried because she thinks she can't stop unless her husband does too. Pt also concerned about weight gain. Discussed healthy eating options with patient and mentioned need to exercise. Would like to see pt come to both Smoking Cessation clinic with Dr. Raymondo Band and nutrition clinic with Dr. Gerilyn Pilgrim. She says that she does not like to take medications and prefers to try to quit cold [place]. She says that after this pack of ciagarettes are  done she is not going to smoke anymore.   Habits & Providers  Alcohol-Tobacco-Diet     Tobacco Status: current     Tobacco Counseling: to quit use of tobacco products     Cigarette Packs/Day: 0.5  Past History:  Past Medical History: copd exacerbations, [id number], multiple clinic visits for bronchitis,  TIA 05-31-08, ECHO wnl, MRI wnl, carotid dopples ?  Family History: Reviewed history from 01/30/2008 and no changes required. family hx of CAD; 3 brothers, one with MI at age 67, oldest brother with colon caner;, father deceased - gsw;, mother deceased - brain aneurysm  Social History: Reviewed history from 01/30/2008 and no changes required. 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 Lungs are clear to auscultation, no crackles or wheezes. Heart:  Normal rate and regular rhythm. S1 and S2 normal without gallop, murmur, click, rub or other extra sounds. Neurologic:  PT has decreased sensation on Rt side of body. Strength is equal bilaterally. Pt has normal gait.  alert & oriented X3 and DTRs symmetrical and normal.      Impression & Recommendations:  Problem # 1:  TRANSIENT ISCHEMIC ATTACK (ICD-435.9) Assessment New Pt was recenty seen at ED and dx with a TIA. Discussed with pt need for FLP and her likely need to decrease cholesterol in her diet. Also established correlation between diabetes and stroke so will check A1c today. Pt understands smoking and obesity put her at higher risk. She is motivated to stop smoking. Pt to follow up in 2-3 weeks.   Orders: [facility] ([address]) [facility]- Est Level  3 (60454)  Problem # 2:  TOBACCO ABUSE (ICD-305.1) Assessment: Unchanged Pt strongly advised to stop smoking. Pt says she  does not want to take pills. She wants to quit cold [place]. She also is willing to visit smoking cessation clinic so pt advised to make appointment to see Dr. Raymondo Band. Pt identifies barriers as her husband who also smokes around her and doesn't want to quit and fear of further weight gain with smoking cessation. Discussed healthy food alternatives for hand-to-mouth habit associated with smoking.   Orders: [facility]- Est Level  3 (09811)  Problem # 3:  OVERWEIGHT (ICD-278.02) Assessment: Unchanged Pt counseled about healthy choices like unbuttered popcorn and carrots to much on once she stoppes smoking. She is afraid of gaining weight but wants to quit smoking due to her recent TIA scare. She was given information about calling Dr. Gerilyn Pilgrim for an appointment.   Orders: [facility]- Est Level  3 (91478)  Complete Medication List: 1)  Multivitamins Caps (Multiple vitamin) .[name].. 1 daily 2)  Vitamin D 1000 Unit Tabs (Cholecalciferol) .... Take once daily 3)  Calcium 500 Mg Chew (Calcium carbonate) .... Take 800mg  daily  Patient Instructions: 1)  Please call our office for an appointment for nutrition (Dr. Gerilyn Pilgrim) 2)  Please make appoiment today to see Dr. Raymondo Band (smoking cessation) 3)  You are going to keep record of your BP's for the next 2-4 weeks--write it down and bring it when you see me next. 4)  I want to see you in 2-3 weeks. Make appointment today. 5)  Take a daily multivitamin 6)  Consider taking Vit D at least 1000 International Units daily. 7)  Calcium 800 mg daily.  8)  NO MORE SMOKING! Prescriptions: CALCIUM 500 MG CHEW (CALCIUM CARBONATE) take 800mg  daily  #45 x 0   Entered and Authorized by:   Jamie Brookes MD   Signed by:   Bonnie [place] on 06/04/2008   Method used:   Historical   RxID:   [id number] VITAMIN D 1000 UNIT TABS (CHOLECALCIFEROL) take once daily  #30 x 0   Entered and Authorized by:   Jamie Brookes MD   Signed by:   Bonnie [place] on 06/04/2008   Method used:    Historical   RxID:   [id number] MULTIVITAMINS  CAPS (MULTIPLE VITAMIN) 1 daily  #30 x 0   Entered and Authorized by:   Jamie Brookes MD   Signed by:   Bonnie [place] on 06/04/2008   Method used:   Historical   RxID:   [id number]   Laboratory Results   Blood Tests   Date/Time Received: June 04, 2008 10:49 AM  Date/Time Reported: June 04, 2008 3:28 PM   HGBA1C: 5.8%   (Normal Range: Non-Diabetic - 3-6%   Control Diabetic - 6-8%)  Comments: ...............test performed by......Marland KitchenBonnie A. [place], [place] (ASCP)

## 2010-02-03 NOTE — Assessment & Plan Note (Signed)
Summary: CPE/pap,df   Vital Signs:  Patient Profile:   50 Years Old Female Height:     60 inches Weight:      172.7 pounds Temp:     97.8 degrees F oral Pulse rate:   76 / minute BP sitting:   136 / 86  (left arm)  Vitals Entered By: Alphia Kava (January 30, 2008 1:38 PM)             Is Patient Diabetic? No     Chief Complaint:  cpe.  History of Present Illness: cc: complete PE, pap, Rt hip pain.  Complete PE:  Pt has not had insurance for several years and needs a complete Physical Exam as well as pap smear.   Pregnancy?: Pt was concerned that she was pregnant but she has tested negative on the Upreg. She is likely experiencing the cessation of her menstration due to menopause.   Smoking Cessation: She has a history of smoking since she was 50 years old (37 years) and has recently cut down from 1 ppd to 1/2 ppd. She does not like to use medications so she has done it herself. She does not want to use any medications to help her quite. She says that when she determines to she will just quit. Pt encouraged to decrease usage even more. She smells heavily of smoke.   Obesity:  Pt is obese at 172.7 lbs. She says that she eats mostly vegetables and does not eat much meat. She does, however, eat a lot of cheese.   Rt hip pain: Pt has been having some Right back/hip pain for the last 3 months. 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 She often has  swelling that comes a goes. This may represent some arthritis in the hip.   Prevention: Pt does need a fasting lipid panal, basic metabolic panal and mammogram as well as vaccines. pt has not hd sign or symptoms of diabetes. She has had SOB but says this is a chronic issue because she has chronic bronchitis.     Past Medical History:    Reviewed history from 03/03/2006 and no changes required:       copd exacerbations, [id number], multiple clinic  visits for bronchitis  Past Surgical History:    Reviewed history and no changes required:       gallbladder       left tubal ligation (s/p tubal ectopic pregnacy)       Rt tube "burt, clipped and tied" ligation   Family History:    Reviewed history from 03/03/2006 and no changes required:       family hx of CAD; 3 brothers, one with MI at age 49, oldest brother with colon caner;, father deceased - gsw;, mother deceased - brain aneurysm  Social History:    Reviewed history from 03/03/2006 and no changes required:       works as a Financial risk analyst; 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    Physical Exam  General:     Well-developed,well-nourished,in no acute distress; alert,appropriate and cooperative throughout examination Head:     Normocephalic and atraumatic without obvious abnormalities. No apparent alopecia or balding. Eyes:     No corneal or conjunctival inflammation noted. EOMI. Perrla. Funduscopic exam benign, without hemorrhages, exudates or papilledema. Vision grossly normal. Ears:     External ear exam shows no significant lesions or deformities.  Otoscopic examination reveals clear canals, tympanic membranes are intact bilaterally without bulging, retraction, inflammation or discharge. Hearing is grossly normal bilaterally. Nose:     External nasal examination shows no deformity or inflammation. Nasal mucosa are pink and moist without lesions or exudates. Mouth:     Oral mucosa and oropharynx without lesions or exudates.  Teeth in poor repair. Breasts:     No mass, nodules, thickening, tenderness, bulging, retraction, inflamation, nipple  discharge or skin changes noted.   Lungs:     Normal respiratory effort, chest expands symmetrically. Lungs are clear to auscultation, no crackles or wheezes. mildly distant lung sounds due to body habitus and likely some COPD Heart:     Normal rate and regular rhythm. S1 and S2 normal without gallop, murmur, click, rub or other extra sounds. Abdomen:     Bowel sounds positive,abdomen soft and non-tender without masses, organomegaly or hernias noted.large RUQ scar from cholecystectomy. multiple stria on lower abdomen. Genitalia:     Normal introitus for age, no external lesions, no vaginal discharge, mucosa pink and moist, no vaginal or cervical lesions, no vaginal atrophy, no friaility or hemorrhage, normal uterus size and position, no adnexal masses or tenderness Msk:     Pt has tenderness along the iliac crest but not along the SI joint on the Rt side of her pelvis.  Pulses:     R and L radial, dorsalis pedis and posterior tibial pulses are full and equal bilaterally Extremities:     No clubbing, cyanosis, edema, or deformity noted with normal full range of motion of all joints.   Skin:     Intact without suspicious lesions or rashes Psych:     Cognition and judgment appear intact. Alert and cooperative with normal attention span and concentration. No apparent delusions, illusions, hallucinations    Impression & Recommendations:  Problem # 1:  WELL WOMAN (ICD-V70.0) Assessment: New Pt came in for a full PE. Has been several years since she has had a PE. Plan to get some baseline labs. BMP and FLP on this pateint due to obesity and increasing age. Want to check kidney function and basic CV dz risk.    Orders: Mammogram (Screening) (Mammo)   Problem # 2:  ELEVATED BP READING WITHOUT DX HYPERTENSION (ICD-796.2) Assessment: New  Pt has elevated BP today. Plan to reassess on next visit. May need to start BP med at that time.   Orders: Basic Met-[facility] (25366-44034) [facility]- Est  Level  4 (74259)   Problem # 3:  ABSENCE OF MENSTRUATION (ICD-626.0) Assessment: New Pt not pregnant. Lack of menstration likely demonstrates menopause.   Orders: U Preg-[facility] (81025) [facility]- Est  Level 4 (99214)   Problem # 4:  SCREENING FOR MALIGNANT NEOPLASM OF THE CERVIX (ICD-V76.2) Assessment: New Pt had pap smear today. No abnormalities noted. Will advise pt if abnormalities seen  Orders: Pap Smear-[facility] (56387-56433) [facility]- Est  Level 4 (29518)   Problem # 5:  ? of OSTEOARTHROS UNSPEC  GEN/LOC PELV REGION&THIGH (ICD-715.95) Assessment: New  Pt advised to use NSAID's for the hip pain. This may be due to some osteoarthritis. Will reassess at next visit.  Orders: [facility]- Est  Level 4 ([address])    Patient Instructions: 1)  Thank you for coming in today.  2)  We are going to draw some blood to look at your kidney function and also look at your random glucose level (blood sugar).  3)  We will set up a mammogram for you.  4)  Consider cutting back even further on the cigarettes. You are doing a good job with it so far. Don't give up! 5)  I will call you or send you a letter if there are any abnormal labs.  6)  I suggest taking ibuprofen 600 mg ever 6 hours for the pain in your hip for 2 weeks and see if it improves. Also keep in mind your stance at work and try to stand with your weight evenly distributed.  7)  Plan to come back in 1 month to get some vaccine updates and recheck your blood pressure. It was mildy high today and needs to be rechecked.  8)  Have a great day!    Laboratory Results   Urine Tests  Date/Time Received: January 30, 2008 2:35 PM  Date/Time Reported: January 30, 2008 2:42 PM     Urine HCG: negative Comments: ..........test performed by.....Marland KitchenMarland KitchenDeseree Blount (SMA)                 confirmed by...Marland KitchenMarland KitchenMarland Kitchen Bonnie A. [place], [place] (ASCP)

## 2010-02-03 NOTE — Miscellaneous (Signed)
Summary: PAP Update  Clinical Lists Changes  Observations: Added new observation of PAP DUE: 02/2004 (11/17/2006 17:28)       Preventive Care Screening  Pap Smear:    Next Due:  02/2004

## 2010-02-03 NOTE — Miscellaneous (Signed)
Summary: dnka [place]  Clinical Lists Changes she did not keep appt for her ultrasound...Marland KitchenMarland KitchenGolden Circle RN  June 16, 2009 3:59 PM     I call the patient to see if she is interested in having this rescheduled. I also wanted to know if she is still bleeding. I think it should be rescheduled but only if the patient is going to go. Will have White Team try to contact the patient as i only got a generic VM and don't want to leave a message for the wrong person.  Jamie Brookes MD  July 01, 2009 1:19 PM   Left message to return call.Gladstone Pih  July 01, 2009 1:47 PM   Apt with you tomorrow, does not want to do [place], stopped bleeding then restarted for 10 deays and stopped, asumed it was her menes, will discuss with you at apt tomorrow, to PCP.Marland KitchenGladstone Pih  July 01, 2009 2:00 PM

## 2010-02-03 NOTE — Letter (Signed)
Summary: Results Follow-up Letter  [facility]  [address]   [place], [place] [address]   Phone: [phone]  Fax: [phone]    01/19/2009  [address] [place], [place]  [address]  Dear Ms. Monterroso,   The following are the results of your recent test(s):  Test     Result      _________________________________________________________ Cholesterol LDL(Bad cholesterol):   131       Your goal is less than: 70        It is time to start a medication to decrease your cholesterol or you are at risk of having another TIA or a stroke/heart attack. Please call my office and leave a message with the name and phone number of your pharmacy so we can send in a prescription.  _________________________________________________________ _________________________________________________________  Sincerely,  Jamie Brookes MD [facility]

## 2010-02-03 NOTE — Miscellaneous (Signed)
Summary: ED Report  Clinical Lists Changes   Complaint: Numbness       Primary Diagnosis: TIA   Arrival Time: 05/31/2008 11:01      Discharge Time: 05/31/2008 17:07   All  Providers:   Dr. Kennon Rounds - MD; Dr. Lequita Asal -   Res   --------------------------------------------------------------------------------------         PROVIDER: Dr. Kennon Rounds - MD        HPI:       The patient is a 50 year old female who presents with a chief complaint of numb-       ness.  The history  was provided by the patient. Patient awoke this morning with       Right facial, RUE, RLE paresthesia's and numbness. She denies weakness,  incoor-       dination, visual changes, headache, auditory changes.  She went to work, told the       nurse on site of her symptoms and was sent to the ER for evaluation.  The patient       denies  any  past medical history. The numbness started 5  hour(s) ago. The onset       was acute. The Pattern is persistent. The Course is unchanged.  The  numbness  is       located  in  the  right  face,  right foot, right hand, right lower extremity and       right upper extremity. The symptoms are described as mild to moderate. There  has       been  no  associated ataxia, back pain, difficulty closing eye, difficulty speak-       ing, difficulty swallowing, difficulty walking, dizziness, headache, nausea, neck       pain,  urinary  incontinence, urinary retention, vertigo or vomiting. The patient       has a significant history of cigarette smoking.     12:01 05/31/2008 by Kennon Rounds - MD, Dr.           Lin[facility] Orn:       Statement: all systems negative except as marked or noted in the HPI     15:51 05/31/2008 by Kennon Rounds - MD, Dr.           [facility]:       Documentation: physician reviewed/amended       Historian: patient          +--------------------------------------------------------------------------------+       +         Patient's Current Physicians         +       +--------------------------------------------------------------------------------+       +   Patient's Current Physicians (please list       +       +   PCP first)           +       +--------------------------------------------------------------------------------+       +   [facility],          +       +--------------------------------------------------------------------------------+          Past medical history: bronchitis       Surgical History: none       Social History: current smoker w/i last 12 mos.          +--------------------------------------------------------------------------------+       +     Allergies         +       +---------------------+----------------------------+-----------------------------+       +  Drug    +     Reaction        +   Allergy Note      +       +---------------------+----------------------------+-----------------------------+       +    NKDA    +          +        +       +---------------------+----------------------------+-----------------------------+     15:51 05/31/2008 by Kennon Rounds - MD, Dr.           Joseph Pierini Medications:       Documentation: physician reviewed/amended          +--------------------------------------------------------------------------------+       +           Medications         +       +------------------+----------------------+-------------------+------------------+       + Medication     +     [Medication]     +     Frequency   + Last Dose    +       +         +     Dosage       +     +       +       +------------------+----------------------+-------------------+------------------+       + None        +        +     +       +       +------------------+----------------------+-------------------+------------------+     15:51 05/31/2008 by Kennon Rounds - MD, Dr.           Physical examination:       Vital signs and O2 SAT: reviewed, hypertensive       Constitutional: well developed, well  nourished, well hydrated, in no  acute  dis-       tress       Head and Face: normocephalic, atraumatic       Eyes: normal appearance, EOMI, PERRL       ENMT: ears, nose and throat normal       Cardiovascular: regular rate and rhythm, no murmur, rub, or gallop       Respiratory:  normal, breath sounds clear  T  equal bilaterally, no rales, rhonchi,       wheezes, or rub       Chest: nontender, no deformity       Abdomen: soft, nontender, nondistended, no masses, normal bowel sounds, no guard-       ing, no rebound tenderness       Extremities: normal, no deformity, neurovascularly intact, pulses normal, no ten-       derness       Neuro: AA T Ox3, Cranial Nerves II-XII intact, motor intact in  all  extremities,       normal reflexes, normal coordination, normal speech, no facial droop, no pronator       drift, right-sided numbness       Skin: color normal, no petechiae       Psychiatric: AA T Ox4, no abnormalities of mood or affect     15:52 05/31/2008 by Kennon Rounds - MD, Dr.           Reviewed result:       Result Type: Cleda Daub: [id number]  Step Type: LAB       Procedure Name: CBC WITH DIFF        Procedure: CBC WITH DIFF        Result:        WBC COUNT    7.2       K/uL  [4.0-10.5]        RBC COUNT    4.58       MIL/uL  [3.87-5.11]        HEMOGLOBIN    14.5       g/dL  [57.8-46.9]        HEMATOCRIT    42.8       %   [36.0-46.0]        MCV    93.3       fL  [78.0-100.0]        MCHC    34.0       g/dL  [62.9-52.8]        RDW    14.2       %   [11.5-15.5]        PLATELET COUNT   272       K/uL  [150-400]        NEUTROPHIL    58       %   [43-77]        ABS GRANULOCYTE   4.2       K/uL  [1.7-7.7]        LYMPHOCYTE    32       %   [12-46]        ABS LYMPH    2.3       K/uL  [0.7-4.0]        MONOCYTE    7       %   [3-12]        ABS MONOCYTE   0.5       K/uL  [0.1-1.0]        EOSINOPHIL    1       %   [0-5]        ABS EOS    0.1       K/uL  [0.0-0.7]          BASOPHIL    1       %   [0-1]        ABS BASO    0.1       K/uL  [0.0-0.1]                Note/Interpretation: no significant anemia or leukocytosis     12:59 05/31/2008 by Kennon Rounds - MD, Dr.        Reviewed result:       Result Type: Cleda Daub: [id number]       Step Type: XRAY       Procedure Name: CT HEAD W/O CM        Procedure: CT HEAD W/O CM        Result:              Clinical Data: 50 year old female who awoke with right arm        numbness. Possible stroke.           CT HEAD WITHOUT CONTRAST           Technique:  Contiguous axial images were obtained from the base of        the  skull through the vertex without contrast.           Comparison: None.           Findings: Visualized orbits and scalp soft tissues are within        normal limits.  Visualized paranasal sinuses and mastoids are        clear.  No acute osseous abnormality identified.           There is asymmetric hypode nsity suggested in the medial inferior        left thalamus (series 2 image 8). Otherwise, Gray-white matter        differentiation is within normal limits throughout the brain.  No        acute intracranial hemorrhage identified. No midline shift, mass        effect, or evidence of mass lesion.  Ventricular size and        configuration are within normal limits.  Cerebral volume is within        normal limits for age.  No suspicious intracranial vascular        hyperdensity.           IMPRESSION:           1.  Questionable hypodensity in the medial left thalamus; acute        small vessel infarct cannot be excluded. Clinical correlation        recommended, and brain MRI would be confirmatory as clinically        indicated.        2.  Otherwise, normal noncontrast appearance of the brain for age.                Note/Interpretation: this finding is concerning for possible acute/subacute  cap-       sular infarct producing noted symptoms.     13:00  05/31/2008 by Kennon Rounds - MD, Dr.        Reviewed result:       Result Type: Cleda Daub: [id number]       Step Type: LAB       Procedure Name: BASIC METABOLIC PANEL        Procedure: BASIC METABOLIC PANEL        Procedure Notes: GFR, Est Afr Am - The eGFR has been calculated using the MDRD       equation. This calculation has not been validated  in  all clinical  situations.       eGFR's persistently <60 mL/min signify possible Chronic Kidney Disease.        Result:        SODIUM    140       mEq/L  [135-145]        POTASSIUM    4.1       mEq/L  [3.5-5.1]        CHLORIDE    109       mEq/L  [96-112]        CARBON DIOXIDE   23       mEq/L  [19-32]        GLUCOSE    80       mg/dL  [08-65]        BUN    9       mg/dL  [7-84]        CREATININE    0.45       mg/dL  [6.9-6.2]  CALCIUM    9.0       mg/dL  [9.1-47.8]        GFR, Est Non Af Am   >60       mL/min  [>60]        GFR, Est Afr Am   >60       mL/min  [>60]           13:00 05/31/2008 by Kennon Rounds - MD, Dr.        Reviewed result:       Result Type: Cleda Daub: [id number]       Step Type: LAB       Procedure Name: MAGNESIUM        Procedure: MAGNESIUM        Result:        MAGNESIUM    1.9       mg/dL  [8.6-5.7]           84:69 05/31/2008 by Kennon Rounds - MD, Dr.        Reviewed result:       Result Type: Cleda Daub: [id number]       Step Type: XRAY       Procedure Name: MR AIN W/O CM        Procedure: MR AIN W/O CM        Result:              Clinical Data: Right-sided facial numbness.           MRI HEAD WITHOUT CONTRAST           Technique:  Multiplanar, multiecho pulse sequences of the brain and        surrounding structures were obtained according to standard protocol        without intravenous contrast.           Comparison: Head CT same day           Findings: Diffusion imaging is negative for acute or subacute        infarction.  The  brainstem and cerebellum are normal.  The cerebral        hemispheres are normal.  The finding at CT related to partial        volume artifact.  No sign of mass lesion, hemorrhage, hydrocephalus        or extra-axial collection.  Sinuses, middle ears and mastoids are        clear.  No skull or skull base lesion.           IMPRESSION:        Normal examination.                Note/Interpretation: I have reviewed the study.  I have contacted family medicine       for admission pending Neurologist evaluation of patient and images to decide dis-       position.     15:16 05/31/2008 by Kennon Rounds - MD, Dr.           Patient disposition:       Patient disposition: Admit -  Inpatient Bed       Primary Diagnosis: stroke     13:29 05/31/2008 by Kennon Rounds - MD, Dr.           MDM:          +--------------------------------------------------------------------------------+       +  MDM          +       +-----------------------------------+------------------------+-------------------+       +   Differential Diagno-  +  Support  +   Tests      +       +   sis    +    +       +       +-----------------------------------+------------------------+-------------------+       +   brain tumor, hypo-  +    +       +       +   glycemia, neuropathy,  +    +       +       +   TIA    +    +       +       +-----------------------------------+------------------------+-------------------+     15:56 05/31/2008 by Kennon Rounds - MD, Dr.           Cardiac:          +--------------------------------------------------------------------------------+       +        EKG          +       +------------+-----------+-----------------+-----------+--------------+----------+       + EKG Time  +   Rate    +    Rhythm      +   Axis    +  PQRS  T    +  ST  T  T  +       +   +      +         +    +  Intervals   +      +        +------------+-----------+-----------------+-----------+--------------+----------+       +   +  71 BPM   + normal sinus   +  normal   +  normal   +  normal  +       +------------+-----------+-----------------+-----------+--------------+----------+     15:57 05/31/2008 by Kennon Rounds - MD, Dr.           Imaging:          +--------------------------------------------------------------------------------+       +     Radiology         +       +----------------+----------------------+------------------------+---------------+       +  Study      +  Interpretation     +      Comments      +  Timestamp   +       +----------------+----------------------+------------------------+---------------+       +  CT scan(s)    + per radiologist,    + left parathalamic    +       +       +       + reviewed by     + hypodensity,      +       +       +       + myself      + questionably sub-    +       +       +       +       + acute stroke      +       +       +----------------+----------------------+------------------------+---------------+  15:58 05/31/2008 by Kennon Rounds - MD, Dr.           Milinda Pointer electronically signed by ER Physician     15:58 05/31/2008 by Kennon Rounds - MD, Dr.        Milinda Pointer electronically signed by ER Physician     07:58 06/04/2008 by Kennon Rounds - MD, Dr.            PROVIDER: Dr. Lequita Asal - Res        Home Medications:       Documentation: physician reviewed/amended          +--------------------------------------------------------------------------------+       +          Medications         +       +--------------------+---------------------+-------------------+-----------------+       + Medication  +    [Medication]     +    Frequency    + Last Dose    +       +    +    Dosage        +     +       +       +--------------------+---------------------+-------------------+-----------------+       +  Aspirin Oral  +    81 mg        +    once a day    +        +       +--------------------+---------------------+-------------------+-----------------+     16:47 05/31/2008 by Lequita Asal - Res, Dr.           Patient disposition:       Patient disposition: Disch - Home       Primary Diagnosis: TIA       Additional diagnoses: tobacco abuse            Counseling: advised of diagnosis, advised of treatment plan,  advised  of  xray         and  lab findings,  advised  of n eed  for close follow-up, advised of need to         return for worsening or changing symptoms, advised of  specific  symptoms  that         should  prompt  their  return,  advised  to quit smoking, advised to return for         signs or symptoms of neurologic injury, patient voices understanding     16:45 05/31/2008 by Lequita Asal - Res, Dr.           Milinda Pointer electronically signed     16:48 05/31/2008 by Lequita Asal - Res, Dr.           ED Course:       Comments: Discussed patient's case with Dr. Vickey Huger  of  neurology.  She  states       that  since  MRI  negative,  pt  has not had completed stroke but rather TIA. She       ordered all studies STAT and therefore, pt has already had 2D  Echo  and  carotid       dopplers.  Prelim  on  dopplers  negative  for carotid stenosis. Dr. Vickey Huger was       agreeable to patient going home, as she has had full work up, doesn't have weak-  ness,  is  able  to  ambulate  and swallow  okay, and  patient hesitant to stay       overnight for observation. This was discussed with patient, and patient to follow       up at [facility] on Tuesday June 04, 2008 at 830. Will have fast-       ing lipid panel drawn at that time. Patient is  to take  daily  aspirin  in  the       interim  and  was  counseled  to stop smoking. Case discussed with Dr. Roosvelt Maser-       armid, FPTS Attending, who was also agreeable to plan.     Originally published: 16:43 05/31/2008 by Lequita Asal - Res, Dr.     [facility]: 16:48  05/31/2008 by Lequita Asal - Res, Dr.           Discharge:       Discharge Instructions:         smoking dangers, smoking dangers - smoking around children, tia             +--------------------------------------------------------------------------------+       +      Referral/Appointment         +       +----------------------+--------------------+-------------------+----------------+       +  Refer Patient To:   +   Phone Number: +   Follow-up in    +  Appointment   +       +      +   +      +  Details:      +       +----------------------+--------------------+-------------------+----------------+       +  Family Practice    +   [phone]  +   4 days     +  06/04/2008    +       +  Center,     +   +      +       +       +----------------------+--------------------+-------------------+----------------+        NOTE - 830     16:47 05/31/2008 by Lequita Asal - Res, Dr.           Medication disposition:          +--------------------------------------------------------------------------------+       +           Medications         +       +-------------+--------------+------------+-----------+------------+-------------+       + Medication  + [Medication] + Frequency  +Last Dose  +Medication  + PCP contact +       +    + Dosage  +       +    +disposition +      +       +-------------+--------------+------------+-----------+------------+-------------+       +Aspirin Oral + 81 mg  +once a day  +    +start       +      +       +-------------+--------------+------------+-----------+------------+-------------+          Physician(s) Contacted: [facility],     16:47 05/31/2008 by Lequita Asal - Res, Dr.

## 2010-02-03 NOTE — Progress Notes (Signed)
Summary: ED results, LAb appt.  Phone Note Other Incoming   Caller: DR Lanier Prude Summary of Call: This patient was a d/c from ED on Friday 5/28. appt made with me for wednesday, but patient can only come on Tuesday 6/1. Needs to come in a.m. for fasting labs. Please place on work-in schedule or with available team provider. I will touch base with whoever that a.m. to discuss patient. Thanks  Initial call taken by: Alphia Kava,  June 04, 2008 8:51 AM  New Problems: OVERWEIGHT (ICD-278.02)   New Problems: OVERWEIGHT (ICD-278.02)

## 2010-02-03 NOTE — Progress Notes (Signed)
Summary: triage  Phone Note Call from Patient Call back at [phone]   Caller: Patient Summary of Call: rt side of body is acting "weird" according to the office nurse her BP - 150/90 heart rate is beating fast and skipping a beat  Initial call taken by: De Nurse,  May 31, 2008 10:23 AM  Follow-up for Phone Call        R side feels "like it is going to sleep" then when it wakes up it hurts. from toes to neck on R side. vision blurred, walking wiith a limp since R leg is "weird". this all started this am & has never happened before. sent to ED. pt agrees. asked her to have someone else drive her  Follow-up by: Golden Circle RN,  May 31, 2008 10:35 AM

## 2010-02-05 NOTE — Progress Notes (Signed)
Summary: results  ---- Converted from flag ---- ---- 01/30/2010 2:19 PM, Betty Brookes MD wrote: Please let this pt know that her pap smear was normal. ------------------------------  pt called and informed of nl pap

## 2010-02-05 NOTE — Assessment & Plan Note (Signed)
Summary: CPE w pap/ fatigue, smoking   Vital Signs:  Patient profile:   50 year old female Height:      60.0 inches Weight:      164.2 pounds BMI:     32.18 Temp:     98.5 degrees F oral Pulse rate:   74 / minute BP sitting:   155 / 91  (right arm) Cuff size:   regular  Vitals Entered By: Jimmy Footman, CMA (January 28, 2010 9:59 AM) CC: cpe w/pap, not sleeping well/fatigue Is Patient Diabetic? No Pain Assessment Patient in pain? no        Primary Care Provider:  Jamie Brookes  CC:  cpe w/pap and not sleeping well/fatigue.  History of Present Illness: CPE with pap: Pt is doing well. She is having some hot flashes. She is still having menstration about every other month.   Smoking cessation: Pt and her husband continue to smoke. Discussed smoking cessation again today. She is precontemplative. Says she will not be able to stop unless her husband stops with her.   Fatigue: Pt has fatigue most days of the week. She wakes up feeling very tired. She is worried about her thryoid level. She says she goes to bed at 8:00 pm and has to be at work at either 5:00 am or 10:00 am. She works until 7:00 pm. She does not do a lot once she gets home because she is very tired. She sleeps well at night but says that her husband says she snores and sometimes quits breathing at night while she is snoring.     Habits & Providers  Alcohol-Tobacco-Diet     Tobacco Status: current  Current Medications (verified): 1)  Multivitamins  Caps (Multiple Vitamin) .[name].. 1 Daily 2)  Vitamin D 1000 Unit Tabs (Cholecalciferol) .... Take Once Daily 3)  Calcium 500 Mg Chew (Calcium Carbonate) .... Take 800mg  Daily 4)  Echinacea 400 Mg Caps (Echinacea) .... Take 800 Mg A Day 5)  Ventolin Hfa 108 (90 Base) Mcg/act Aers (Albuterol Sulfate) .... Take 2 Puffs Inhaled Up To Every 4-6 Hours.  Allergies (verified): No Known Drug Allergies  Review of Systems        vitals reviewed and pertinent negatives and  positives seen in HPI   Physical Exam  General:  Well-developed,well-nourished,in no acute distress; alert,appropriate and cooperative throughout examination Head:  Normocephalic and atraumatic without obvious abnormalities. No apparent alopecia or balding. Eyes:  No corneal or conjunctival inflammation noted. EOMI. Perrla. Vision grossly normal. Ears:  External ear exam shows no significant lesions or deformities.  Otoscopic examination reveals clear canals, tympanic membranes are intact bilaterally without bulging, retraction, inflammation or discharge. Hearing is grossly normal bilaterally. Nose:  External nasal examination shows no deformity or inflammation. Nasal mucosa are pink and moist without lesions or exudates. Lungs:  Normal respiratory effort, chest expands symmetrically. Lungs are clear to auscultation, no crackles or wheezes. slight wheeze on the right but it cleared with deep breathing.  Heart:  Normal rate and regular rhythm. S1 and S2 normal without gallop, murmur, click, rub or other extra sounds. Abdomen:  Bowel sounds positive,abdomen soft and non-tender without masses, or  organomegaly Genitalia:  Normal introitus for age, no external lesions, no vaginal discharge, mucosa pink and moist, no vaginal or cervical lesions, no vaginal atrophy, no friaility or hemorrhage, normal uterus size and position, no adnexal masses or tenderness Msk:  No deformity or scoliosis noted of thoracic or lumbar spine.   Extremities:  No  clubbing, cyanosis, edema, or deformity noted with normal full range of motion of all joints.   Skin:  Intact without suspicious lesions or rashes Psych:  Cognition and judgment appear intact. Alert and cooperative with normal attention span and concentration. No apparent delusions, illusions, hallucinations   Impression & Recommendations:  Problem # 1:  WELL WOMAN (ICD-V70.0) Assessment Unchanged Pt is doing well other than smoking and feeling fatigued. See  below.   Orders: [facility] - Est  40-64 yrs (81191)  Problem # 2:  SCREENING FOR MALIGNANT NEOPLASM OF THE CERVIX (ICD-V76.2) Assessment: Unchanged Pap smear done today. Pt will not need to have another one for the next 3 years if this one is normal.   Orders: Pap Smear-[facility] ([id number]) [facility] - Est  40-64 yrs ([id number])  Problem # 3:  FATIGUE (ICD-780.79) Assessment: New Pt is tired when she wakes up, has periods of what sounds like apnea and snores. I plan to send her for a sleep study.   Orders: CBC-[facility] (84696) [facility] - Est  40-64 yrs (29528)  Problem # 4:  TOBACCO ABUSE (ICD-305.1) Assessment: Comment Only discussed stopping or decreasing smoking. Pt is precontemplative.   Complete Medication List: 1)  Multivitamins Caps (Multiple vitamin) .[name].. 1 daily 2)  Vitamin D 1000 Unit Tabs (Cholecalciferol) .... Take once daily 3)  Calcium 500 Mg Chew (Calcium carbonate) .... Take 800mg  daily 4)  Echinacea 400 Mg Caps (Echinacea) .... Take 800 mg a day 5)  Ventolin Hfa 108 (90 Base) Mcg/act Aers (Albuterol sulfate) .... Take 2 puffs inhaled up to every 4-6 hours.  Patient Instructions: 1)  Ask your husband if you ever stop breathing/snoring at night for a few seconds and then start up again or if your snoring is just continous and regular.  2)  It was good to see you again.  3)  Your BP was high today. It was 155/91. It is unusua for you to be that high but keep an eye on it (check it at [facility] or your pharmacy) and if it continues to be high (over 130/80) come back in to be seen.  4)  We are checking a blood level on you today. I'll call you with results.    Orders Added: 1)  Pap Smear-[facility] [[id number]] 2)  CBC-[facility] [[id number]] 3)  [facility] - Est  40-64 yrs [[id number]]

## 2010-02-05 NOTE — Progress Notes (Signed)
  Phone Note Outgoing Call Call back at [facility] Phone [phone]   Call placed by: Jimmy Footman, CMA,  January 29, 2010 8:59 AM Call placed to: Patient Summary of Call: Spoke with pt and informed her that we are going to do a sleep study referral on her

## 2010-02-05 NOTE — Progress Notes (Signed)
Summary: sleep study referral done  Phone Note Call from Patient   Caller: Patient Call For: [phone] Summary of Call: Pt calling back to say that husband says that she does snore.  Husband says that she stops breathing while sleeping.  Wanted to let you know.  Follow-up for Phone Call        Please let her know that I am going to have her set up for a sleep study because of sleep apnea. She may need some extra oxygen at night to help her sleep better and to feel more rejuvinated when she wakes up.  Follow-up by: Jamie Brookes MD,  January 28, 2010 8:40 PM  New Problems: SLEEP APNEA (ICD-780.57)   New Problems: SLEEP APNEA (ICD-780.57)

## 2010-02-11 NOTE — Progress Notes (Signed)
Summary: Phn Msg  Phone Note From Other Clinic Call back at [phone]   Caller: [facility] Summary of Call: pt has [employer] ins, needs to know if this requires precert? Initial call taken by: Knox Royalty,  February 02, 2010 9:37 AM  Follow-up for Phone Call        called pt to verify insurance company so that we can do pre cert for sleep study Follow-up by: Loralee Pacas CMA,  February 03, 2010 10:28 AM  Additional Follow-up for Phone Call Additional follow up Details #1::        returning call Additional Follow-up by: Knox Royalty,  February 03, 2010 10:56 AM    Additional Follow-up for Phone Call Additional follow up Details #2::    got ins info from pt ID [id number] [phone] Follow-up by: Loralee Pacas CMA,  February 03, 2010 11:44 AM

## 2010-03-09 ENCOUNTER — Ambulatory Visit (HOSPITAL_BASED_OUTPATIENT_CLINIC_OR_DEPARTMENT_OTHER): Payer: PRIVATE HEALTH INSURANCE | Attending: Family Medicine

## 2010-03-09 DIAGNOSIS — G4733 Obstructive sleep apnea (adult) (pediatric): Secondary | ICD-10-CM | POA: Insufficient documentation

## 2010-03-14 DIAGNOSIS — G4733 Obstructive sleep apnea (adult) (pediatric): Secondary | ICD-10-CM

## 2010-03-16 ENCOUNTER — Inpatient Hospital Stay (INDEPENDENT_AMBULATORY_CARE_PROVIDER_SITE_OTHER)
Admission: RE | Admit: 2010-03-16 | Discharge: 2010-03-16 | Disposition: A | Discharge status: Home / Self Care | Payer: PRIVATE HEALTH INSURANCE | Source: Ambulatory Visit | Attending: Family Medicine | Admitting: Family Medicine

## 2010-03-16 DIAGNOSIS — R05 Cough: Secondary | ICD-10-CM

## 2010-03-16 DIAGNOSIS — R059 Cough, unspecified: Secondary | ICD-10-CM

## 2010-03-16 DIAGNOSIS — R112 Nausea with vomiting, unspecified: Secondary | ICD-10-CM

## 2010-04-14 LAB — DIFFERENTIAL
Basophils Absolute: 0.1 10*3/uL (ref 0.0–0.1)
Basophils Relative: 1 % (ref 0–1)
Eosinophils Absolute: 0.1 10*3/uL (ref 0.0–0.7)
Eosinophils Relative: 1 % (ref 0–5)
Lymphocytes Relative: 32 % (ref 12–46)
Lymphs Abs: 2.3 10*3/uL (ref 0.7–4.0)
Monocytes Absolute: 0.5 10*3/uL (ref 0.1–1.0)
Monocytes Relative: 7 % (ref 3–12)
Neutro Abs: 4.2 10*3/uL (ref 1.7–7.7)
Neutrophils Relative %: 58 % (ref 43–77)

## 2010-04-14 LAB — CBC
HCT: 42.8 % (ref 36.0–46.0)
Hemoglobin: 14.5 g/dL (ref 12.0–15.0)
MCHC: 34 g/dL (ref 30.0–36.0)
MCV: 93.3 fL (ref 78.0–100.0)
Platelets: 272 10*3/uL (ref 150–400)
RBC: 4.58 MIL/uL (ref 3.87–5.11)
RDW: 14.2 % (ref 11.5–15.5)
WBC: 7.2 10*3/uL (ref 4.0–10.5)

## 2010-04-14 LAB — BASIC METABOLIC PANEL
BUN: 9 mg/dL (ref 6–23)
CO2: 23 mEq/L (ref 19–32)
Calcium: 9 mg/dL (ref 8.4–10.5)
Chloride: 109 mEq/L (ref 96–112)
Creatinine, Ser: 0.45 mg/dL (ref 0.4–1.2)
GFR calc Af Amer: >60 mL/min (ref >60)
GFR calc non Af Amer: >60 mL/min (ref >60)
Glucose, Bld: 80 mg/dL (ref 70–99)
Potassium: 4.1 mEq/L (ref 3.5–5.1)
Sodium: 140 mEq/L (ref 135–145)

## 2010-04-14 LAB — MAGNESIUM: Magnesium: 1.9 mg/dL (ref 1.5–2.5)

## 2010-04-23 ENCOUNTER — Encounter: Payer: Self-pay | Admitting: Family Medicine

## 2010-05-07 ENCOUNTER — Encounter: Payer: Self-pay | Admitting: Family Medicine

## 2010-05-19 NOTE — Consult Note (Signed)
Betty Chapman, Betty Chapman           ACCOUNT NO.:  [id number]   MEDICAL RECORD NO.:  [id number]          PATIENT TYPE:  EMS   LOCATION:  [facility]                         FACILITY:  [facility]   PHYSICIAN:  Leighton Roach McDiarmid, M.D.DATE OF BIRTH:  07-14-1960   DATE OF CONSULTATION:  05/31/2008  DATE OF DISCHARGE:  05/31/2008                                 CONSULTATION   CHIEF COMPLAINT:  Right-sided numbness.   PRIMARY CARE PHYSICIAN:  Jamie Brookes, MD of [facility]   HISTORY OF PRESENT ILLNESS:  The patient is an otherwise  healthy 50  year old female with history of tobacco abuse who presents after  awakening on the morning of evaluation with right-sided numbness.  No  other neurologic problems.  No prior history of similar symptoms.  The  patient denies headache, chest pain, shortness of breath, palpitations.  The patient outside of window for tPA treatment.  The patient was  evaluated by Dr. Vickey Huger of Neurology and had workup completed stat in  anticipation of 3 day weekend including MRI, MRA, carotid Dopplers and  2D echocardiogram.   PAST MEDICAL HISTORY:  Borderline high blood pressure, ectopic  pregnancy.   ALLERGIES:  No known drug allergies.   MEDICATIONS:  Echinacea.   PAST SURGICAL HISTORY:  BTL and cholecystectomy.   SOCIAL HISTORY:  The patient is married.  Positive tobacco use.  The  patient denies alcohol and drugs.   REVIEW OF SYSTEMS:  As per HPI, otherwise  negative.   PHYSICAL EXAMINATION:  VITAL SIGNS:  Temperature 98, blood pressure  147/87, heart rate 78, respiratory rate 20, O2 saturation 99% on room  air.  GENERAL:  In no apparently distress, awake, alert and tearful.  HEENT:  Mucous membranes are moist.  Extraocular muscles intact.  Pupils  equal, round, reactive to light and accommodation.  No bruits.  RESPIRATORY:  Clear to auscultation bilaterally.  CARDIOVASCULAR:  Regular rate and rhythm.  No murmurs, rubs or gallops.  ABDOMEN:   Obese, nontender, positive bowel sounds.  EXTREMITIES:  No edema, 2+ distal pulses.  NEUROLOGIC:  The patient with cranial nerves II through XII grossly  intact.  No facial droop noted.  Paresthesia of right upper and lower  extremities with intact strength 5 out of 5 in bilateral upper and lower  extremities.   LABORATORY DATA:  Magnesium 1.9, sodium 140, potassium 4.1, chloride  109, bicarb 23, glucose 80, BUN 9, creatinine 0.45, calcium 9, white  blood cell count 7.2, hemoglobin 14.5, hematocrit 42.8, platelets 272.  Preliminary read on carotid Dopplers without  ICA stenosis bilaterally.  MRI of the brain normal.  Head CT with questionable hypodensity in the  medial left thalamus, otherwise  normal.   ASSESSMENT AND PLAN:  1. Transient ischemic attack:  Discussed case with Dr.  Vickey Huger,  who      given MRI findings, feels like the patient's symptoms consistent      with transient ischemic attack.  The patient completed majority of      workup already including MRI, carotid Dopplers, 2D echocardiogram.      MRI was negative for  any sign of infarct.  Preliminary Dopplers      did not show stenosis.  Therefore Dr. Vickey Huger was okay with the      patient going home being that she does not have any weakness, was      able to ambulate, able to swallow and the patient expressed      hesitancy to stay overnight for observation given some tension      between she and her husband.  She was  agreeable to discharge home      with close followup.  She is to followup at [facility] on Tuesday, June 1 at 8:30 for fasting lipid panel and to      discuss test results.  The patient is to take a daily aspirin in      the interim and was counseled to stop smoking.  This was discussed      with Dr.  McDiarmid, who was agreeable to this as well.  The      patient was given extensive counseling on warning signs to look for      repeat TIA or stroke including slurred speech, weakness,  paralysis,      increased paresthesias, etc.  2. Tobacco abuse.  The patient has been counseled to stop smoking.  3. Borderline hypertension.  Will defer to the patient's primary care      physician for management.  Would not decrease acutely given TIA.   DISPOSITION:  The patient was discharged home.   [facility].m[facility]  for labs and appointment.   DISCHARGE CONDITION:  Stable.   DISCHARGE MEDICATIONS:  Aspirin 81 mg p.o. daily.      Lauro Franklin, MD  Electronically Signed      Leighton Roach McDiarmid, M.D.  Electronically Signed    TCB/MEDQ  D:  06/01/2008  T:  06/01/2008  Job:  [id number]

## 2010-05-19 NOTE — Consult Note (Signed)
NAME:  Betty, Chapman NO.:  [id number]   MEDICAL RECORD NO.:  [id number]          PATIENT TYPE:  EMS   LOCATION:  [facility]                         FACILITY:  [facility]   PHYSICIAN:  Melvyn Novas, M.D.  DATE OF BIRTH:  [birth year]   DATE OF CONSULTATION:  05/31/2008  DATE OF DISCHARGE:  05/31/2008                                 CONSULTATION   REFERRING PHYSICIAN:  Requested by the ER at [facility].   Betty Chapman is a 50 year old Caucasian right-handed female  with a history of obesity and continued nicotine abuse who presented  today with right-sided dysesthesias.  She states that when she awoke  from sleep this morning she noticed her right face to feel different as  well as the right arm and leg.  She still went to work, but at work a  coworker made her aware of her right face being droopy and suggested  that she will seek medical attention.  The patient arrived at the [facility] ER at 11 hours in the morning and Neurology was consulted by Dr.  Excell Seltzer, non urgently.  He had already shown the CT scan to me.  The CT  scan was obtained at about 13 hours and showed a left prior thalamic  hypodensity, not well defined, but it would actually fit the symptoms  that the patient presented with.   When we examined Ms. Betty Chapman, she was sitting in bed, in no acute  distress, but was eager to leave the ER to go out and smoke.  She stated  that she is on no medications, but has been followed as outpatient by  the [facility] at the hospital.  She admits to continues to  smoke and to have a very sedentary lifestyle.  She states she just  recently remarried and her husband was with her at the bedside.  She has  no previous history of stroke or seizures and denies any history of  hypertension, obstructive sleep apnea, atrial fibrillation, diabetes,  but she is certainly morbidly obese.   In her review of systems, she only endorsed dysesthesias on  the right  side and a feeling of heaviness, tingling, numbness in the right face.  Her right hand was clumsy, she states.  She denies any fever, nausea,  vomiting, diarrhea, vertigo, gait difficulties or balance difficulties,  bruising, clubbing, cyanosis, spasms, swallowing problems, or  palpitations.  She also states that she has no allergies to any  medicines that she knows of.   FAMILY HISTORY:  Positive for hypertension.   SOCIAL HISTORY:  In addition to the patient stating that she has no  other medical history, we found her blood pressure today elevated at  155/73 mmHg.  The patient states she has not been on blood pressure  medicine and that this is probably a result of her being nervous.  She  denies any alcohol or illicit drug use.  She is gainfully employed,  married, lives with her husband at a private home.  She endorsed no  surgical history on the intake sheet.   Her lungs have wheezing in  both central lung fields.  She has a regular  rate and rhythm of the heart at 68 beats per minute.  Abdomen is soft,  nontender, nondistended, obese.  Neck shows no carotid bruit.  Skin  shows normal skin turgor.  She does neither appear to be depressed nor  elated.  She has no peripheral bruising, clubbing, or cyanosis, but she  became quite agitated when she learned that she is not expected to leave  the ER to smoke outside and then to return at her convenience.   Her lab tests include a basic metabolic panel, which returned normal.  PT/INR and PTT were pending.  Her EKG shows normal sinus rhythm.  Her CT  of the brain showed the possible left prior thalamic hypodensity that I  cannot state to be acute or to be preexisting.   We asked Dr. Excell Seltzer to obtain an MRI of the brain basically to rule in  or rule out a stroke bydiffusion-weighted image.  It would not be  necessary to give gadolinium.  The patient needs to start an aspirin a  day, needs urgently to lose weight, and to stop  smoking.  The [facility] will admit this patient for 23-hour observation.  We  suggested to obtain a fasting lipid panel, HbA1c, and a tox screen.  I  would also place her on a multivitamin, perhaps even a prenatal vitamins  due to the included vitamin B complex.  The patient may benefit from  Neurontin for the treatment of paresthesias if a thalamic stroke is  verified.  A dose of 100 mg b.i.d. or 200 mg at night can in some  patients significantly improve the symptoms of paresthesias.  I would  like to add that during our exam.  Her cranial nerve exam showed a  facial droop on the right.  Her right-sided grip strength was weaker.  Her right finger-nose-finger test was slower and slightly dysmetric, and  she has a very subtle pronator drift.  She has, however, bilateral  downgoing toes, equal reflexes, and her sensory to her primary  modalities is most effected at the face and antebrachium on the right  side and she likens this to a tingling dysesthesia, not burning, not  painful.  The patient upon learning that she likely had suffered a  stroke perhaps a TIA was still not willing to stay in hospital.  Her  husband discussed with her after we left the ER and tried to convince  her to stay for a workup at least for an observational stay of 23 hours.  I am not optimistic if he succeeded.  It would also be beneficial to  have a 2-D echo and a carotid Doppler study ordered before 3 p.m. today  so that these studies can be performed prior to the upcoming prolonged  holiday weekend.      Melvyn Novas, M.D.  Electronically Signed     CD/MEDQ  D:  05/31/2008  T:  06/01/2008  Job:  [id number]   cc:   [facility] Service  Pramod P. Pearlean Brownie, MD

## 2010-06-04 ENCOUNTER — Inpatient Hospital Stay (INDEPENDENT_AMBULATORY_CARE_PROVIDER_SITE_OTHER)
Admission: RE | Admit: 2010-06-04 | Discharge: 2010-06-04 | Disposition: A | Discharge status: Home / Self Care | Payer: PRIVATE HEALTH INSURANCE | Source: Ambulatory Visit | Attending: Family Medicine | Admitting: Family Medicine

## 2010-06-04 ENCOUNTER — Ambulatory Visit (INDEPENDENT_AMBULATORY_CARE_PROVIDER_SITE_OTHER): Payer: PRIVATE HEALTH INSURANCE

## 2010-06-04 DIAGNOSIS — M549 Dorsalgia, unspecified: Secondary | ICD-10-CM

## 2010-07-22 ENCOUNTER — Encounter: Payer: Self-pay | Admitting: Family Medicine

## 2010-07-22 ENCOUNTER — Ambulatory Visit (INDEPENDENT_AMBULATORY_CARE_PROVIDER_SITE_OTHER): Payer: PRIVATE HEALTH INSURANCE | Admitting: Family Medicine

## 2010-07-22 VITALS — BP 152/87 | HR 65 | Temp 98.1°F | Ht 60.0 in | Wt 157.0 lb

## 2010-07-22 DIAGNOSIS — M549 Dorsalgia, unspecified: Secondary | ICD-10-CM

## 2010-07-22 NOTE — Progress Notes (Signed)
  Subjective:    Patient ID: [name], female    DOB: 1960-03-08, 50 y.o.   MRN: [id number]  HPI Right back pain: Describes as burning sensation, wraps around right hip and down into right groin.  The pain then radiates down back of right leg.  Has had these symptoms off and on x 3-4 years.  Soaking in warm bath water helps.  Lifting and moving things makes pain worse.  No bowel or bladder problems. No fever.  No flank pain.  No weakness.      Review of Systems As per above.     Objective:   Physical Exam  Constitutional: She is oriented to person, place, and time. She appears well-developed.  Cardiovascular: Normal rate.   No murmur heard. Pulmonary/Chest: Effort normal. No respiratory distress.  Musculoskeletal: Normal range of motion. She exhibits no edema.       Minimal right low back paraspinal muscle tenderness with palpation.  No tenderness on palpation of spine.  No flank pain.  No CV tenderness.  Normal rom of right hip and knee.  No joint redness. No joint swelling.  Strength in lower ext 5/5 and equal bilateral.  Sensation grossly intact.  Reflexes in lower ext present and equal bilateral.   Neurological: She is alert and oriented to person, place, and time.  Skin: No rash noted.  Psychiatric: She has a normal mood and affect. Her behavior is normal. Judgment and thought content normal.          Assessment & Plan:

## 2010-07-22 NOTE — Patient Instructions (Signed)
I will place referral for physical therapy.  The staff will call you with an appointment.

## 2010-07-27 DIAGNOSIS — G8929 Other chronic pain: Secondary | ICD-10-CM | POA: Insufficient documentation

## 2010-07-27 DIAGNOSIS — M549 Dorsalgia, unspecified: Secondary | ICD-10-CM | POA: Insufficient documentation

## 2010-07-27 NOTE — Assessment & Plan Note (Signed)
Pain/numbness off and on x 3 years in right lower back that wraps around right hip and right groin area then radiates down back of right leg.  PE wnl.  No red flags in hpi.  Offered neurotin for treatment of pain, most likely caused by nerve irritation from chronic back injury.  Pt refused stating that she doesn't want to take medications for this.  Agrees to physical therapy.  Will have staff arrange her appointment.

## 2011-02-09 ENCOUNTER — Encounter (HOSPITAL_COMMUNITY): Payer: Self-pay | Admitting: *Deleted

## 2011-02-09 ENCOUNTER — Emergency Department (INDEPENDENT_AMBULATORY_CARE_PROVIDER_SITE_OTHER)
Admission: EM | Admit: 2011-02-09 | Discharge: 2011-02-09 | Disposition: A | Discharge status: Home / Self Care | Payer: PRIVATE HEALTH INSURANCE | Source: Home / Self Care | Attending: Family Medicine | Admitting: Family Medicine

## 2011-02-09 DIAGNOSIS — B3789 Other sites of candidiasis: Secondary | ICD-10-CM

## 2011-02-09 DIAGNOSIS — B3749 Other urogenital candidiasis: Secondary | ICD-10-CM

## 2011-02-09 MED ORDER — KETOCONAZOLE 2 % EX GEL: CUTANEOUS | Status: DC

## 2011-02-09 NOTE — ED Notes (Signed)
Pt  Reports  She     Noticed  A  Rash  Under the  r   Breast today  -  She  Reports  It  Itches  And  Has  An  Odor      She  denys  Any other  Symptoms

## 2011-02-11 NOTE — ED Provider Notes (Signed)
History     CSN: [id number]  Arrival date & time 02/09/11  1348   First MD Initiated Contact with Patient 02/09/11 1610      Chief Complaint  Patient presents with  . Rash    (Consider location/radiation/quality/duration/timing/severity/associated sxs/prior treatment) HPI Comments: 51 y/o female smoker here c/o rash below both breasts. Noticed today. Itches, bad smell. No h/o diabetes.    History reviewed. No pertinent past medical history.  Past Surgical History  Procedure Date  . Btl   . Cholecystectomy     History reviewed. No pertinent family history.  History  Substance Use Topics  . Smoking status: Current Everyday Smoker -- 0.5 packs/day    Types: Cigarettes  . Smokeless tobacco: Not on file  . Alcohol Use: Yes     occasonally    OB History    Grav Para Term Preterm Abortions TAB SAB Ect Mult Living                  Review of Systems  Constitutional: Negative for fever, appetite change, fatigue and unexpected weight change.  HENT: Negative for neck pain.   Skin: Positive for rash.  Hematological: Negative for adenopathy.  All other systems reviewed and are negative.    Allergies  Review of patient's allergies indicates no known allergies.  Home Medications   Current Outpatient Rx  Name Route Sig Dispense Refill  . ALBUTEROL SULFATE HFA 108 (90 BASE) MCG/ACT IN AERS Inhalation Inhale 2 puffs into the lungs. Up to every 4-6 hours.     [name] CALCIUM 500 MG PO CHEW Oral Chew 800 mg by mouth daily.      [name] VITAMIN D 1000 UNITS PO TABS Oral Take 1,000 Units by mouth daily.      [name] ECHINACEA 400 MG PO CAPS Oral Take 800 mg by mouth daily.      [name] KETOCONAZOLE 2 % EX GEL  Apply TID in affected skin up to 1 week after rash is resolved. 15 g 1  . MULTIVITAMINS PO CAPS Oral Take 1 capsule by mouth daily. Pt advised to get OTC multivitamin and take daily       BP 130/72  Pulse 78  Temp(Src) 98 F (36.7 C) (Oral)  Resp 20  SpO2 96%  Physical Exam  Nursing  note and vitals reviewed. Constitutional: She is oriented to person, place, and time. She appears well-developed and well-nourished. No distress.  Cardiovascular: Regular rhythm.   Pulmonary/Chest: Breath sounds normal.  Lymphadenopathy:    She has no cervical adenopathy.  Neurological: She is alert and oriented to person, place, and time.  Skin:       Pruriginous erythema with bright weeping skin and satellite lesions c/ with candida infection located in fold below both breasts. No suppurative no skin breaks or signs of cellulitis or over infection.     ED Course  Procedures (including critical care time)  Labs Reviewed - No data to display No results found.   1. Candidiasis of breast       MDM  Treated with ketoconazole gel and preventive measures discussed. reccommended to have diabetes screening is recurrent.         Sharin Grave, MD 02/11/11 1208

## 2011-06-01 ENCOUNTER — Encounter: Payer: Self-pay | Admitting: Family Medicine

## 2011-06-01 ENCOUNTER — Ambulatory Visit (INDEPENDENT_AMBULATORY_CARE_PROVIDER_SITE_OTHER): Payer: PRIVATE HEALTH INSURANCE | Admitting: Family Medicine

## 2011-06-01 ENCOUNTER — Ambulatory Visit
Admission: RE | Admit: 2011-06-01 | Discharge: 2011-06-01 | Disposition: A | Discharge status: Home / Self Care | Payer: PRIVATE HEALTH INSURANCE | Source: Ambulatory Visit | Attending: Family Medicine | Admitting: Family Medicine

## 2011-06-01 VITALS — BP 152/84 | HR 88 | Temp 98.1°F | Ht 60.0 in | Wt 161.0 lb

## 2011-06-01 DIAGNOSIS — R05 Cough: Secondary | ICD-10-CM

## 2011-06-01 DIAGNOSIS — R059 Cough, unspecified: Secondary | ICD-10-CM

## 2011-06-01 DIAGNOSIS — F17200 Nicotine dependence, unspecified, uncomplicated: Secondary | ICD-10-CM

## 2011-06-01 MED ORDER — METHYLPREDNISOLONE SODIUM SUCC 125 MG IJ SOLR
125.0000 mg | Freq: Once | INTRAMUSCULAR | Status: AC
  Administered 2011-06-01: 125 mg via INTRAMUSCULAR

## 2011-06-01 MED ORDER — ALBUTEROL SULFATE HFA 108 (90 BASE) MCG/ACT IN AERS: 2.0000 | INHALATION_SPRAY | Freq: Four times a day (QID) | RESPIRATORY_TRACT | Status: DC | PRN

## 2011-06-01 MED ORDER — PREDNISONE 50 MG PO TABS: ORAL_TABLET | ORAL | Status: AC

## 2011-06-01 MED ORDER — DOXYCYCLINE HYCLATE 100 MG PO TABS: 100.0000 mg | ORAL_TABLET | Freq: Two times a day (BID) | ORAL | Status: AC

## 2011-06-01 NOTE — Assessment & Plan Note (Addendum)
Likely viral induced COPD exacerbation. Steroids and doxy. CXR to eval for significant infiltrate (last documented CXR 10 years ago). Follow up in 7-10 days. Handout given. Infectious and resp red flags discussed.  Would benefit from outpt PFTs in 6-8 weeks pending resolution of sxs.

## 2011-06-01 NOTE — Assessment & Plan Note (Signed)
Cessation discussed. Not ready to quit.

## 2011-06-01 NOTE — Progress Notes (Signed)
Addended by: Jennette Bill on: 06/01/2011 11:06 AM   Modules accepted: Orders

## 2011-06-01 NOTE — Progress Notes (Signed)
  Subjective:    Patient ID: Betty Chapman, female    DOB: 1960-12-11, 51 y.o.   MRN: [id number]  HPI URI Symptoms Onset: 2-3 days  Description: nasal congestion, post nasal drip, cough, mild dyspnea  Modifying factors:  40 pack year smoking history  Symptoms Nasal discharge: yes Fever: no Sore throat: no Cough: yes Wheezing: mild  Ear pain: no GI symptoms: no Sick contacts: yes  Red Flags  Stiff neck: no Dyspnea: yes Rash: no Swallowing difficulty: no  Sinusitis Risk Factors Headache/face pain: no Double sickening: no tooth pain: no  Allergy Risk Factors Sneezing: no Itchy scratchy throat: no Seasonal symptoms: no  Flu Risk Factors Headache: no muscle aches: no severe fatigue: no     Review of Systems See HPI, otherwise ROS negative     Objective:   Physical Exam Gen: up in chair, NAD HEENT: NCAT, EOMI, TMs clear bilaterally, +nasal erythema, rhinorrhea bilaterally, + post oropharyngeal erythema  CV: RRR, no murmurs auscultated PULM: fair air movement, faint wheezes in apices bilaterally  ABD: S/NT/+ bowel sounds  EXT: 2+ peripheral pulses    Assessment & Plan:

## 2011-06-01 NOTE — Patient Instructions (Signed)

## 2011-12-21 ENCOUNTER — Emergency Department (HOSPITAL_COMMUNITY)
Admission: EM | Admit: 2011-12-21 | Discharge: 2011-12-21 | Disposition: A | Discharge status: Home / Self Care | Payer: PRIVATE HEALTH INSURANCE | Source: Home / Self Care | Attending: Family Medicine | Admitting: Family Medicine

## 2011-12-21 ENCOUNTER — Encounter (HOSPITAL_COMMUNITY): Payer: Self-pay | Admitting: *Deleted

## 2011-12-21 DIAGNOSIS — R197 Diarrhea, unspecified: Secondary | ICD-10-CM

## 2011-12-21 MED ORDER — LOPERAMIDE HCL 2 MG PO CAPS: 2.0000 mg | ORAL_CAPSULE | Freq: Four times a day (QID) | ORAL | Status: DC | PRN

## 2011-12-21 NOTE — ED Notes (Signed)
Pt  Reports  Symptoms  Of  Diarrhea  X  2  Days      7  Movements  Yesterday  5  Movements  Today          Pt   denys  Any  Pain   Vomiting  Or  Any other  Symptoms  Other  Than the  Diarrhea    She  Reports    Even clear  Liquids   Got  Right  Threw  Her        She  Ambulated  To  Room  With a  Steady  Fluid  Gait         She  Is in  No     Severe  Distress

## 2011-12-21 NOTE — ED Provider Notes (Signed)
History     CSN: [id number]  Arrival date & time 12/21/11  1306   First MD Initiated Contact with Patient 12/21/11 1424      Chief Complaint  Patient presents with  . Diarrhea    (Consider location/radiation/quality/duration/timing/severity/associated sxs/prior treatment) Patient is a 51 y.o. female presenting with diarrhea. The history is provided by the patient.  Diarrhea The primary symptoms include diarrhea. Primary symptoms do not include fever, abdominal pain, nausea, vomiting, hematemesis, hematochezia or dysuria. The illness began 2 days ago. The onset was sudden. The problem has not changed since onset. The illness does not include chills or back pain.    History reviewed. No pertinent past medical history.  Past Surgical History  Procedure Date  . Btl   . Cholecystectomy     No family history on file.  History  Substance Use Topics  . Smoking status: Current Every Day Smoker -- 0.5 packs/day    Types: Cigarettes  . Smokeless tobacco: Not on file  . Alcohol Use: Yes     Comment: occasonally    OB History    Grav Para Term Preterm Abortions TAB SAB Ect Mult Living                  Review of Systems  Constitutional: Negative.  Negative for fever and chills.  Gastrointestinal: Positive for diarrhea. Negative for nausea, vomiting, abdominal pain, blood in stool, hematochezia, anal bleeding, rectal pain and hematemesis.  Genitourinary: Negative for dysuria.  Musculoskeletal: Negative for back pain.    Allergies  Review of patient's allergies indicates no known allergies.  Home Medications   Current Outpatient Rx  Name  Route  Sig  Dispense  Refill  . ALBUTEROL SULFATE HFA 108 (90 BASE) MCG/ACT IN AERS   Inhalation   Inhale 2 puffs into the lungs every 6 (six) hours as needed for wheezing. Up to every 4-6 hours.   18 g   1   . CALCIUM 500 MG PO CHEW   Oral   Chew 800 mg by mouth daily.           [name] VITAMIN D 1000 UNITS PO TABS   Oral   Take  1,000 Units by mouth daily.           [name] ECHINACEA 400 MG PO CAPS   Oral   Take 800 mg by mouth daily.           [name] KETOCONAZOLE 2 % EX GEL      Apply TID in affected skin up to 1 week after rash is resolved.   15 g   1   . LOPERAMIDE HCL 2 MG PO CAPS   Oral   Take 1 capsule (2 mg total) by mouth 4 (four) times daily as needed for diarrhea or loose stools.   30 capsule   0   . MULTIVITAMINS PO CAPS   Oral   Take 1 capsule by mouth daily. Pt advised to get OTC multivitamin and take daily            There were no vitals taken for this visit.  Physical Exam  Nursing note and vitals reviewed. Constitutional: She is oriented to person, place, and time. She appears well-developed and well-nourished.  HENT:  Head: Normocephalic.  Eyes: Conjunctivae normal are normal. Pupils are equal, round, and reactive to light.  Abdominal: Soft. Bowel sounds are normal. She exhibits no distension and no mass. There is no tenderness. There is no rebound and  no guarding.  Neurological: She is alert and oriented to person, place, and time.  Skin: Skin is warm and dry.  Psychiatric: She has a normal mood and affect.    ED Course  Procedures (including critical care time)  Labs Reviewed - No data to display No results found.   1. Diarrhea in adult patient       MDM          Linna Hoff, MD 12/21/11 [phone]

## 2011-12-31 ENCOUNTER — Ambulatory Visit (INDEPENDENT_AMBULATORY_CARE_PROVIDER_SITE_OTHER): Payer: PRIVATE HEALTH INSURANCE | Admitting: Family Medicine

## 2011-12-31 ENCOUNTER — Encounter: Payer: Self-pay | Admitting: Family Medicine

## 2011-12-31 VITALS — BP 171/86 | HR 88 | Temp 98.9°F | Ht 60.0 in | Wt 161.2 lb

## 2011-12-31 DIAGNOSIS — J209 Acute bronchitis, unspecified: Secondary | ICD-10-CM

## 2011-12-31 MED ORDER — DOXYCYCLINE HYCLATE 100 MG PO TABS: 100.0000 mg | ORAL_TABLET | Freq: Two times a day (BID) | ORAL | Status: DC

## 2011-12-31 MED ORDER — PREDNISONE 50 MG PO TABS: 50.0000 mg | ORAL_TABLET | Freq: Every day | ORAL | Status: DC

## 2011-12-31 NOTE — Assessment & Plan Note (Addendum)
Likely undiagnosed COPD.  Will treat with doxy + prednisone and see back for PFTs. Given the lack of sputum production and lack of fevers feel that pneumonia is relatively unlikely. As the patient is not short of breath and is not tachycardic feel that PE is very unlikely.

## 2011-12-31 NOTE — Patient Instructions (Signed)
I want you to take both prednisone and an antibiotic for the next little bit (prednisone for 5 days, antibiotic for 10 days).  Come back to [place] in about 2 weeks to recheck your blood pressure and to see Dr. Raymondo Band (our pharmacist) for lung tests.

## 2011-12-31 NOTE — Progress Notes (Signed)
Patient ID: Betty Chapman, female   DOB: Feb 25, 1960, 51 y.o.   MRN: [id number] Subjective: The patient is a 51 y.o. year old female who presents today for cough.  The patient reports that she has had a five-day history of cough along with upper respiratory tract symptoms. She reports the cough is relatively dry and has become bad enough that she has been unable to work for the last 2 days. She denies any fevers, chills, pain with deep breaths, lower extremity swelling, or significant productive cough. She's been taking over-the-counter cough syrup to little effect. The patient reports that she has 2-3 episodes of similar symptoms per year and has been diagnosed with bronchitis in the past. The patient has a 41 year smoking history. She is not previously been diagnosed with asthma or COPD.  Patient's past medical, social, and family history were reviewed and updated as appropriate. History  Substance Use Topics  . Smoking status: Current Every Day Smoker -- 0.5 packs/day    Types: Cigarettes  . Smokeless tobacco: Not on file  . Alcohol Use: Yes     Comment: occasonally   Objective:  Filed Vitals:   12/31/11 0849  BP: 171/86  Pulse: 88  Temp: 98.9 F (37.2 C)   Gen: Patient is coughing copiously with minimal sputum production. She is not tachypneic. CV: Regular rate and rhythm, no murmurs appreciated Resp: Diffuse wheezes throughout. No rales or rhonchi. Ext: No lower extremity swelling  Assessment/Plan:  Please also see individual problems in problem list for problem-specific plans.

## 2012-01-01 ENCOUNTER — Encounter (HOSPITAL_COMMUNITY): Payer: Self-pay

## 2012-01-01 ENCOUNTER — Observation Stay (HOSPITAL_COMMUNITY)
Admission: EM | Admit: 2012-01-01 | Discharge: 2012-01-02 | Disposition: A | Discharge status: Home / Self Care | Payer: PRIVATE HEALTH INSURANCE | Attending: Family Medicine | Admitting: Family Medicine

## 2012-01-01 ENCOUNTER — Emergency Department (HOSPITAL_COMMUNITY): Payer: PRIVATE HEALTH INSURANCE

## 2012-01-01 ENCOUNTER — Other Ambulatory Visit: Payer: Self-pay

## 2012-01-01 DIAGNOSIS — F17200 Nicotine dependence, unspecified, uncomplicated: Secondary | ICD-10-CM

## 2012-01-01 DIAGNOSIS — J40 Bronchitis, not specified as acute or chronic: Principal | ICD-10-CM | POA: Insufficient documentation

## 2012-01-01 DIAGNOSIS — R0602 Shortness of breath: Secondary | ICD-10-CM | POA: Insufficient documentation

## 2012-01-01 DIAGNOSIS — R030 Elevated blood-pressure reading, without diagnosis of hypertension: Secondary | ICD-10-CM

## 2012-01-01 DIAGNOSIS — R059 Cough, unspecified: Secondary | ICD-10-CM | POA: Diagnosis present

## 2012-01-01 DIAGNOSIS — I10 Essential (primary) hypertension: Secondary | ICD-10-CM

## 2012-01-01 DIAGNOSIS — R05 Cough: Secondary | ICD-10-CM | POA: Insufficient documentation

## 2012-01-01 DIAGNOSIS — J441 Chronic obstructive pulmonary disease with (acute) exacerbation: Secondary | ICD-10-CM

## 2012-01-01 DIAGNOSIS — R7309 Other abnormal glucose: Secondary | ICD-10-CM | POA: Insufficient documentation

## 2012-01-01 HISTORY — DX: Tobacco use: Z72.0

## 2012-01-01 HISTORY — DX: Transient cerebral ischemic attack, unspecified: G45.9

## 2012-01-01 LAB — CBC
HCT: 41.1 % (ref 36.0–46.0)
Hemoglobin: 12.9 g/dL (ref 12.0–15.0)
MCH: 30.1 pg (ref 26.0–34.0)
MCHC: 31.4 g/dL (ref 30.0–36.0)
MCV: 96 fL (ref 78.0–100.0)
Platelets: 250 10*3/uL (ref 150–400)
RBC: 4.28 MIL/uL (ref 3.87–5.11)
RDW: 14.4 % (ref 11.5–15.5)
WBC: 6.3 10*3/uL (ref 4.0–10.5)

## 2012-01-01 LAB — COMPREHENSIVE METABOLIC PANEL
ALT: 14 U/L (ref 0–35)
AST: 16 U/L (ref 0–37)
Albumin: 3.5 g/dL (ref 3.5–5.2)
Alkaline Phosphatase: 111 U/L (ref 39–117)
BUN: 13 mg/dL (ref 6–23)
CO2: 22 mEq/L (ref 19–32)
Calcium: 8.5 mg/dL (ref 8.4–10.5)
Chloride: 105 mEq/L (ref 96–112)
Creatinine, Ser: 0.63 mg/dL (ref 0.50–1.10)
GFR calc Af Amer: >90 mL/min (ref >90)
GFR calc non Af Amer: >90 mL/min (ref >90)
Glucose, Bld: 282 mg/dL — ABNORMAL HIGH (ref 70–99)
Potassium: 4.1 mEq/L (ref 3.5–5.1)
Sodium: 140 mEq/L (ref 135–145)
Total Bilirubin: 0.1 mg/dL — ABNORMAL LOW (ref 0.3–1.2)
Total Protein: 6.7 g/dL (ref 6.0–8.3)

## 2012-01-01 LAB — POCT I-STAT TROPONIN I: Troponin i, poc: 0 ng/mL (ref 0.00–0.08)

## 2012-01-01 MED ORDER — IPRATROPIUM BROMIDE 0.02 % IN SOLN
0.5000 mg | RESPIRATORY_TRACT | Status: DC
  Administered 2012-01-01 (×3): 0.5 mg via RESPIRATORY_TRACT
  Filled 2012-01-01 (×3): qty 2.5

## 2012-01-01 MED ORDER — ALBUTEROL (5 MG/ML) CONTINUOUS INHALATION SOLN
5.0000 mg/h | INHALATION_SOLUTION | Freq: Once | RESPIRATORY_TRACT | Status: DC
  Filled 2012-01-01: qty 20

## 2012-01-01 MED ORDER — ALBUTEROL (5 MG/ML) CONTINUOUS INHALATION SOLN
10.0000 mg/h | INHALATION_SOLUTION | RESPIRATORY_TRACT | Status: DC
  Administered 2012-01-01: 10 mg/h via RESPIRATORY_TRACT

## 2012-01-01 MED ORDER — ALBUTEROL SULFATE (5 MG/ML) 0.5% IN NEBU: 2.5000 mg | INHALATION_SOLUTION | RESPIRATORY_TRACT | Status: DC | PRN

## 2012-01-01 MED ORDER — GUAIFENESIN ER 600 MG PO TB12
600.0000 mg | ORAL_TABLET | Freq: Two times a day (BID) | ORAL | Status: DC
  Administered 2012-01-01 – 2012-01-02 (×2): 600 mg via ORAL
  Filled 2012-01-01 (×3): qty 1

## 2012-01-01 MED ORDER — IPRATROPIUM BROMIDE 0.02 % IN SOLN
0.5000 mg | RESPIRATORY_TRACT | Status: DC
  Administered 2012-01-01 – 2012-01-02 (×5): 0.5 mg via RESPIRATORY_TRACT
  Filled 2012-01-01 (×5): qty 2.5

## 2012-01-01 MED ORDER — ENOXAPARIN SODIUM 40 MG/0.4ML ~~LOC~~ SOLN
40.0000 mg | SUBCUTANEOUS | Status: DC
  Filled 2012-01-01 (×2): qty 0.4

## 2012-01-01 MED ORDER — ALBUTEROL SULFATE (5 MG/ML) 0.5% IN NEBU
2.5000 mg | INHALATION_SOLUTION | RESPIRATORY_TRACT | Status: DC
  Administered 2012-01-01 – 2012-01-02 (×5): 2.5 mg via RESPIRATORY_TRACT
  Filled 2012-01-01 (×5): qty 0.5

## 2012-01-01 MED ORDER — NICOTINE 14 MG/24HR TD PT24
14.0000 mg | MEDICATED_PATCH | Freq: Every day | TRANSDERMAL | Status: DC
  Filled 2012-01-01 (×2): qty 1

## 2012-01-01 MED ORDER — SODIUM CHLORIDE 0.9 % IV BOLUS (SEPSIS)
1000.0000 mL | Freq: Once | INTRAVENOUS | Status: AC
  Administered 2012-01-01: 1000 mL via INTRAVENOUS

## 2012-01-01 MED ORDER — PREDNISONE 50 MG PO TABS
50.0000 mg | ORAL_TABLET | Freq: Every day | ORAL | Status: DC
  Administered 2012-01-01 – 2012-01-02 (×2): 50 mg via ORAL
  Filled 2012-01-01 (×2): qty 1

## 2012-01-01 MED ORDER — DOXYCYCLINE HYCLATE 100 MG PO TABS
100.0000 mg | ORAL_TABLET | Freq: Two times a day (BID) | ORAL | Status: DC
  Administered 2012-01-01 – 2012-01-02 (×2): 100 mg via ORAL
  Filled 2012-01-01 (×3): qty 1

## 2012-01-01 MED ORDER — DEXTROMETHORPHAN POLISTIREX 30 MG/5ML PO LQCR
10.0000 mL | Freq: Two times a day (BID) | ORAL | Status: DC | PRN
  Administered 2012-01-01 – 2012-01-02 (×2): 60 mg via ORAL
  Filled 2012-01-01 (×3): qty 10

## 2012-01-01 MED ORDER — ALBUTEROL SULFATE (5 MG/ML) 0.5% IN NEBU
2.5000 mg | INHALATION_SOLUTION | RESPIRATORY_TRACT | Status: DC
  Administered 2012-01-01 (×3): 2.5 mg via RESPIRATORY_TRACT
  Filled 2012-01-01 (×3): qty 0.5

## 2012-01-01 MED ORDER — METHYLPREDNISOLONE SODIUM SUCC 125 MG IJ SOLR
125.0000 mg | Freq: Once | INTRAMUSCULAR | Status: AC
  Administered 2012-01-01: 125 mg via INTRAVENOUS
  Filled 2012-01-01: qty 2

## 2012-01-01 NOTE — ED Notes (Signed)
Respiratory at bedside.

## 2012-01-01 NOTE — ED Notes (Signed)
Betty Chapman at bedside, pt continues to cough

## 2012-01-01 NOTE — ED Provider Notes (Signed)
Pt is having sob and coughing.  She is a chronic smoker and likely has COPD.  She was given duo-neb along with 1 hour neb.  Plan to repeat at noon.  Given solumedrol due to continued expiratory wheezes and diffuse rhonchi on lung exam despite nebulizers.  Pt took her own doxycycline without our knowing.  Advised pt that we would prefer she only take medication that we prescribed while she is in the ED.  Pt voiced understanding but was disgruntled by that would cost her more money.  If pt continues to have sob, then further discuss with Dr. Blinda Leatherwood.  Lindley Magnus [place], [place] 01/01/12 1155  Consulted [facility].  They will come evaluate pt.  Lindley Magnus [place], [place] 01/01/12 1351

## 2012-01-01 NOTE — ED Provider Notes (Signed)
History     CSN: [id number]  Arrival date & time 01/01/12  4098   First MD Initiated Contact with Patient 01/01/12 [phone]      Chief Complaint  Patient presents with  . Cough  . Shortness of Breath    (Consider location/radiation/quality/duration/timing/severity/associated sxs/prior treatment) HPI 51 year old female with past medical history significant for bronchitis and long-term smoking history presents today with chief complaint of coughing, shortness of breath.  She was seen yesterday at an urgent care.  She was noted to have wheezing and rhonchi with cough.  Her cough has been nonproductive.  Provider felt she had underlying undiagnosed COPD but did not get a chest x-ray.  She was started on doxycycline and prednisone.  Patient has been using her albuterol inhaler frequently. She has had several episodes of post tussive vomiting and gagging.   No past medical history on file.  Past Surgical History  Procedure Date  . Btl   . Cholecystectomy     No family history on file.  History  Substance Use Topics  . Smoking status: Current Every Day Smoker -- 0.5 packs/day    Types: Cigarettes  . Smokeless tobacco: Not on file  . Alcohol Use: Yes     Comment: occasonally    OB History    Grav Para Term Preterm Abortions TAB SAB Ect Mult Living                  Review of Systems Ten systems reviewed and are negative for acute change, except as noted in the HPI.   Allergies  Review of patient's allergies indicates no known allergies.  Home Medications   Current Outpatient Rx  Name  Route  Sig  Dispense  Refill  . ALBUTEROL SULFATE HFA 108 (90 BASE) MCG/ACT IN AERS   Inhalation   Inhale 2 puffs into the lungs every 6 (six) hours as needed for wheezing. Up to every 4-6 hours.   18 g   1   . DOXYCYCLINE HYCLATE 100 MG PO CPEP   Oral   Take 100 mg by mouth 2 (two) times daily. For 10 days Started 12/27         . OVER THE COUNTER MEDICATION   Oral   Take by mouth  2 (two) times daily as needed. Old indian bark syrup  For cough         . PREDNISONE 50 MG PO TABS   Oral   Take 50 mg by mouth daily. For 5 days Started 12/27           BP 149/95  Pulse 125  Temp 98.2 F (36.8 C) (Oral)  Resp 18  SpO2 99%  LMP 12/26/2011  Physical Exam  Nursing note and vitals reviewed. Constitutional: She is oriented to person, place, and time. She appears well-developed and well-nourished. No distress.       51 year old female in no acute distress.  Patient appears older than stated age.  HENT:  Head: Normocephalic and atraumatic.  Eyes: Conjunctivae normal are normal. No scleral icterus.  Neck: Normal range of motion.  Cardiovascular: Normal rate, regular rhythm and normal heart sounds.  Exam reveals no gallop and no friction rub.   No murmur heard. Pulmonary/Chest:       Patient with hacking cough.  She has coarse rhonchi.  Diffuse wheezes throughout all lung fields both inspiratory and expiratory.  She is not using accessory muscles to breathe.  Abdominal: Soft. Bowel sounds are normal. She exhibits no  distension and no mass. There is no tenderness. There is no guarding.  Neurological: She is alert and oriented to person, place, and time.  Skin: Skin is warm and dry. She is not diaphoretic.    ED Course  Procedures (including critical care time)  Labs Reviewed - No data to display No results found.   Date: 01/01/2012  Rate: 112  Rhythm: sinus tachycardia  QRS Axis: normal  Intervals: normal  ST/T Wave abnormalities: early repolarization  Conduction Disutrbances:none  Narrative Interpretation:   Old EKG Reviewed: changes noted    No diagnosis found.    MDM  9:58 AM Patient has received duo neb.  She appears to be breathing plus effort.  Her cough is more productive.  She still has diffuse wheezing throughout her lung fields.  Initiated and hour-long continuous nebulizer with albuterol. will recheck the patient.   11:09  AM Patient has moved to CDU. I believe she will be able to go home with Hycodan. Sh already has doxy and prednisone. I have given report to PA Barth Kirks who has agreed to assume care of the patient     Arthor Captain, PA-C 01/01/12 1110

## 2012-01-01 NOTE — ED Notes (Signed)
Pt here for sob and cough since Monday and seen for same yesterday started on abx and steroid yesterday for bronchiitis, no relief today, dry cough noted today, sts she does smoke.

## 2012-01-01 NOTE — H&P (Signed)
Betty Chapman is an 51 y.o. female.   Assessment/Plan 51 yo female with long history of smoking here with increasing shortness of breath and wheezing.  Pulm #Shortness of breath: No diagnosis of COPD but given long history of smoking and frequent diagnosis of bronchitis she most likely has underlying COPD.  She is planning on having PFT's at our clinic in the next few weeks. Has been on doxycycline and prednisone x1 day but feels like symptoms worsening.  As this has been worsening over the past week, she has other URI symptoms  and she has no O2 requirement I think PE is less likely.  Her CXR does not indicate PNA either. -Admit for observation and monitor on continuous pulse oximetry -Continue doxycycline and prednisone -Duonebs Q4 hours scheduled with Q2 hour Albuterol PRN -O2 as needed -Mucinex and dextromethorphan for cough -Smoking cessation counseling  #Tobacco Abuse -Nicotine patch -Smoking cessation counseling.   CV #Elevated BP:  Systolic BP elevated today.  Has been elevated in clinic before as well.   -Continue to monitor pressures -If remains elevated plan to start HCTZ.  FEN/GI -Regular diet -SLIV  PPx -Lovenox  Dispo Pending further improvement of respiratory status.     Chief Complaint: Shortness of breath/Cough/Wheezing  HPI: 51 year old female with 41 year history of smoking here with complaint shortness of breath, cough and wheezing.  Symptoms began on 12/23 and feels like she has gradually been worsening over the past week.  She has needed to use her albuterol inhaler more and just does not feel that well. She was last seen in our clinic yesterday and started on doxycyline and prednisone, however she does not feel like her symptoms have improved at all.  She does endorse some congestion with sputum production and mild chest pain only with coughing over the past day.  She denies fever, chills, leg swelling or pain, headache, nausea, vomiting, diarrhea.     ED Course:  Received Duoneb x2 with no improvement.  Then sent to CDU and started on 1 hour continuous albterol nebulizer and given Solu-medrol.  Still with little improvement in wheezing and shortness of breath, so we were asked to admit.   Past Medical History  Diagnosis Date  . Tobacco abuse   . TIA (transient ischemic attack)      Past Surgical History  Procedure Date  . Btl   . Cholecystectomy     No family history on file. Social History:  reports that she has been smoking Cigarettes.  She has been smoking about .5 packs per day. She does not have any smokeless tobacco history on file. She reports that she drinks alcohol. Her drug history not on file.  Allergies: No Known Allergies   (Not in a hospital admission)  Results for orders placed during the hospital encounter of 01/01/12 (from the past 48 hour(s))  POCT I-STAT TROPONIN I     Status: Normal   Collection Time   01/01/12  9:38 AM      Component Value Range Comment   Troponin i, poc 0.00  0.00 - 0.08 ng/mL    Comment 3             Dg Chest 2 View  01/01/2012  *RADIOLOGY REPORT*  Clinical Data: Cough and shortness of breath for 1 week.  Chest pain.  Smoker.  CHEST - 2 VIEW  Comparison: 06/01/2011  Findings: Cholecystectomy clips. Midline trachea.  Normal heart size and mediastinal contours. No pleural effusion or pneumothorax.  Diffuse peribronchial thickening.  No lobar consolidation.  IMPRESSION: 1.  No acute cardiopulmonary disease. 2.  Mild peribronchial thickening which may relate to chronic bronchitis or smoking.   Original Report Authenticated By: Jeronimo Greaves, M.D.     ROS Per HPI, otherwise negative   Blood pressure 152/66, pulse 110, temperature 98.2 F (36.8 C), temperature source Oral, resp. rate 23, last menstrual period 12/26/2011, SpO2 100.00%. Physical Exam  Constitutional: She appears well-nourished. No distress.       Appears older than listed age   HENT:  Head: Normocephalic and  atraumatic.  Mouth/Throat: Oropharynx is clear and moist. No oropharyngeal exudate.       Some fluid behind TM R>L, however no erythema  Eyes: Conjunctivae normal are normal. Pupils are equal, round, and reactive to light. No scleral icterus.  Neck: Neck supple. No tracheal deviation present.  Cardiovascular:       Tachycardic.  No murmurs heard   Respiratory:       Mild increased WOB.  She has diffuse wheezing throughout lung fields.  Frequently coughing.   GI: Soft. Bowel sounds are normal. She exhibits no distension. There is no tenderness. There is no rebound and no guarding.  Musculoskeletal: She exhibits no edema.       No calf size discrepancy or tenderness.   Lymphadenopathy:    She has no cervical adenopathy.  Neurological: She is alert.       Betty Chapman 01/01/2012, 2:26 PM

## 2012-01-01 NOTE — ED Notes (Signed)
Pt here for cough and sob, onset Monday and worse now seen at [facility] yesterday for same and dx with bronchitis, pt was started on abx and prednisonse for same.

## 2012-01-02 LAB — CBC
HCT: 41.8 % (ref 36.0–46.0)
Hemoglobin: 13.2 g/dL (ref 12.0–15.0)
MCH: 30 pg (ref 26.0–34.0)
MCHC: 31.6 g/dL (ref 30.0–36.0)
MCV: 95 fL (ref 78.0–100.0)
Platelets: 288 10*3/uL (ref 150–400)
RBC: 4.4 MIL/uL (ref 3.87–5.11)
RDW: 14.3 % (ref 11.5–15.5)
WBC: 10.1 10*3/uL (ref 4.0–10.5)

## 2012-01-02 MED ORDER — BECLOMETHASONE DIPROPIONATE 80 MCG/ACT IN AERS: 2.0000 | INHALATION_SPRAY | RESPIRATORY_TRACT | Status: DC | PRN

## 2012-01-02 MED ORDER — GUAIFENESIN ER 600 MG PO TB12: 600.0000 mg | ORAL_TABLET | Freq: Two times a day (BID) | ORAL | Status: DC

## 2012-01-02 MED ORDER — DEXTROMETHORPHAN POLISTIREX 30 MG/5ML PO LQCR: 60.0000 mg | Freq: Two times a day (BID) | ORAL | Status: DC | PRN

## 2012-01-02 MED ORDER — HYDROCHLOROTHIAZIDE 25 MG PO TABS: 25.0000 mg | ORAL_TABLET | Freq: Every day | ORAL | Status: DC

## 2012-01-02 MED ORDER — HYDROCHLOROTHIAZIDE 12.5 MG PO TABS: 12.5000 mg | ORAL_TABLET | Freq: Every day | ORAL | Status: DC

## 2012-01-02 NOTE — Progress Notes (Signed)
NURSING PROGRESS NOTE  RANIYA GOLEMBESKI [id number] Discharge Data: 01/02/2012 3:15 PM Attending Provider: Carney Living, MD XTG:GYIRSWN, Selena Batten, MD     [name] to be D/C'd Home per MD order.  Discussed with the patient the After Visit Summary and all questions fully answered. All IV's discontinued with no bleeding noted. All belongings returned to patient for patient to take home.   Last Vital Signs:  Blood pressure 150/74, pulse 106, temperature 98.6 F (37 C), temperature source Oral, resp. rate 19, height 5' (1.524 m), weight 73.6 kg (162 lb 4.1 oz), last menstrual period 12/27/2011, SpO2 96.00%.  Discharge Medication List   Medication List     As of 01/02/2012  3:15 PM    STOP taking these medications         OVER THE COUNTER MEDICATION      TAKE these medications         albuterol 108 (90 BASE) MCG/ACT inhaler   Commonly known as: PROVENTIL HFA;VENTOLIN HFA   Inhale 2 puffs into the lungs every 6 (six) hours as needed for wheezing. Up to every 4-6 hours.      beclomethasone 80 MCG/ACT inhaler   Commonly known as: QVAR   Inhale 2 puffs into the lungs as needed.      dextromethorphan 30 MG/5ML liquid   Commonly known as: DELSYM   Take 10 mLs (60 mg total) by mouth 2 (two) times daily as needed for cough.      doxycycline 100 MG DR capsule   Commonly known as: DORYX   Take 100 mg by mouth 2 (two) times daily. For 10 days  Started 12/27      guaiFENesin 600 MG 12 hr tablet   Commonly known as: MUCINEX   Take 1 tablet (600 mg total) by mouth 2 (two) times daily.      hydrochlorothiazide 25 MG tablet   Commonly known as: HYDRODIURIL   Take 1 tablet (25 mg total) by mouth daily.      predniSONE 50 MG tablet   Commonly known as: DELTASONE   Take 50 mg by mouth daily. For 5 days  Started 12/27        Mackensi Mahadeo, Elmarie Mainland, RN

## 2012-01-02 NOTE — H&P (Signed)
Family Medicine Teaching Service Attending Note  I interviewed and examined patient Betty Chapman and reviewed their tests and x-rays.  I discussed with Dr. Ashley Royalty and reviewed their note for today.  I agree with their assessment and plan.     Additionally  Feeling better except for cough No shortness of breath with ambulation Ok to discharge on COPD exacerbation therapy with [facility] followup

## 2012-01-02 NOTE — Progress Notes (Signed)
Family Medicine Teaching Service Attending Note  I discussed patient Betty Chapman  with Dr. Aviva Signs and reviewed their note for today.  I agree with their assessment and plan.

## 2012-01-02 NOTE — Progress Notes (Signed)
Daily Progress Note Family Medicine Teaching Service PGY-2   Patient name: Betty Chapman  Medical record [id number] Date of birth:[birth year] Age: 51 y.o. Gender: female  LOS: 1 day   Subjective: feeling better. Wants to go home.  Objective: Vitals: Filed Vitals:   01/02/12 1406  BP: 150/74  Pulse: 106  Temp: 98.6 F (37 C)  Resp: 19    Intake/Output Summary (Last 24 hours) at 01/02/12 1428 Last data filed at 01/02/12 1407  Gross per 24 hour  Intake    240 ml  Output      0 ml  Net    240 ml   Physical Exam: Gen:  NAD HEENT: Moist mucous membranes CV: Regular rate and rhythm, no murmurs rubs or gallops PULM: Clear to auscultation bilaterally. No wheezes ABD: Soft, non tender, non distended, normal bowel sounds EXT: No edema Neuro: Alert and oriented x3. No focalization   Labs and imaging:  CBC  Lab 01/02/12 0800 01/01/12 1748  WBC 10.1 6.3  HGB 13.2 12.9  HCT 41.8 41.1  PLT 288 250   BMET  Lab 01/01/12 1748  NA 140  K 4.1  CL 105  CO2 22  BUN 13  CREATININE 0.63  LABGLOM --  GLUCOSE 282*  CALCIUM 8.5   Medications: Scheduled Meds:   . albuterol  2.5 mg Nebulization Q4H  . doxycycline  100 mg Oral Q12H  . enoxaparin (LOVENOX) injection  40 mg Subcutaneous Q24H  . guaiFENesin  600 mg Oral BID  . ipratropium  0.5 mg Nebulization Q4H  . nicotine  14 mg Transdermal Daily  . predniSONE  50 mg Oral Daily   Continuous Infusions:  PRN Meds:.albuterol, dextromethorphan  Assessment and Plan: 51 yo female with long history of smoking admitted with increasing shortness of breath and wheezing.   1. Shortness of breath: No diagnosis of COPD but given long history of smoking and frequent diagnosis of bronchitis she most likely has underlying COPD. She is planning on having PFT's at our clinic in the next few weeks. Has been on doxycycline and prednisone x1 day. No O2 requirement. Notable improved with duonebs, prednisone and continuing Doxy.    2. Elevated BP: Systolic BP elevated for more than 3 occasions. - Start HCTZ 25 mg today.   - Continue to monitor pressures.  3. Hyperglycemia: pt on prednisone, this can be the cause. Asymptomatic. - will recommend f/u as outpatient when steroid treatment has stopped.  FEN/GI: Regular diet. PPx : Lovenox  Dispo: possible d/c home today with close f/u  D. [place] Rolene Arbour, MD PGY2, Family Medicine Teaching Service  01/02/2012

## 2012-01-02 NOTE — Progress Notes (Signed)
Utilization review completed.  

## 2012-01-02 NOTE — Discharge Summary (Signed)
Physician Discharge Summary  Patient ID: Betty Chapman [id number] 09-12-1960 51 y.o.  Admit date: 01/01/2012 Discharge date: 01/02/2012  PCP: Delbert Harness, MD   Discharge Diagnosis: 1. Bronchitis most likely COPD.  2. Hypertension   Discharge Medications  Betty Chapman, Betty Chapman  Home Medication Instructions [id number]   Printed on:01/02/12 1439  Medication Information                    albuterol (VENTOLIN HFA) 108 (90 BASE) MCG/ACT inhaler Inhale 2 puffs into the lungs every 6 (six) hours as needed for wheezing. Up to every 4-6 hours.           predniSONE (DELTASONE) 50 MG tablet Take 50 mg by mouth daily. For 5 days Started 12/27           doxycycline (DORYX) 100 MG DR capsule Take 100 mg by mouth 2 (two) times daily. For 10 days Started 12/27           OVER THE COUNTER MEDICATION Take by mouth 2 (two) times daily as needed. Old indian bark syrup  For cough              Consults: None  Labs: CBC  Lab 01/02/12 0800 01/01/12 1748  WBC 10.1 6.3  HGB 13.2 12.9  HCT 41.8 41.1  PLT 288 250   BMET  Lab 01/01/12 1748  NA 140  K 4.1  CL 105  CO2 22  BUN 13  CREATININE 0.63  CALCIUM 8.5  PROT 6.7  BILITOT 0.1*  ALKPHOS 111  ALT 14  AST 16  GLUCOSE 282*   Results for orders placed during the hospital encounter of 01/01/12 (from the past 72 hour(s))  POCT I-STAT TROPONIN I     Status: Normal   Collection Time   01/01/12  9:38 AM      Component Value Range Comment   Troponin i, poc 0.00  0.00 - 0.08 ng/mL    Comment 3            CBC     Status: Normal   Collection Time   01/01/12  5:48 PM      Component Value Range Comment   WBC 6.3  4.0 - 10.5 K/uL    RBC 4.28  3.87 - 5.11 MIL/uL    Hemoglobin 12.9  12.0 - 15.0 g/dL    HCT 95.6  21.3 - 08.6 %    MCV 96.0  78.0 - 100.0 fL    MCH 30.1  26.0 - 34.0 pg    MCHC 31.4  30.0 - 36.0 g/dL    RDW 57.8  46.9 - 62.9 %    Platelets 250  150 - 400 K/uL   COMPREHENSIVE METABOLIC PANEL     Status:  Abnormal   Collection Time   01/01/12  5:48 PM      Component Value Range Comment   Sodium 140  135 - 145 mEq/L    Potassium 4.1  3.5 - 5.1 mEq/L    Chloride 105  96 - 112 mEq/L    CO2 22  19 - 32 mEq/L    Glucose, Bld 282 (*) 70 - 99 mg/dL    BUN 13  6 - 23 mg/dL    Creatinine, Ser 5.28  0.50 - 1.10 mg/dL    Calcium 8.5  8.4 - 41.3 mg/dL    Total Protein 6.7  6.0 - 8.3 g/dL    Albumin 3.5  3.5 - 5.2 g/dL  AST 16  0 - 37 U/L    ALT 14  0 - 35 U/L    Alkaline Phosphatase 111  39 - 117 U/L    Total Bilirubin 0.1 (*) 0.3 - 1.2 mg/dL    GFR calc non Af Amer >90  >90 mL/min    GFR calc Af Amer >90  >90 mL/min   CBC     Status: Normal   Collection Time   01/02/12  8:00 AM      Component Value Range Comment   WBC 10.1  4.0 - 10.5 K/uL    RBC 4.40  3.87 - 5.11 MIL/uL    Hemoglobin 13.2  12.0 - 15.0 g/dL    HCT 16.1  09.6 - 04.5 %    MCV 95.0  78.0 - 100.0 fL    MCH 30.0  26.0 - 34.0 pg    MCHC 31.6  30.0 - 36.0 g/dL    RDW 40.9  81.1 - 91.4 %    Platelets 288  150 - 400 K/uL     Procedures/Imaging:  Dg Chest 2 View  01/01/2012  *RADIOLOGY REPORT*  Clinical Data: Cough and shortness of breath for 1 week.  Chest pain.  Smoker.  CHEST - 2 VIEW  Comparison: 06/01/2011  Findings: Cholecystectomy clips. Midline trachea.  Normal heart size and mediastinal contours. No pleural effusion or pneumothorax. Diffuse peribronchial thickening.  No lobar consolidation.  IMPRESSION: 1.  No acute cardiopulmonary disease. 2.  Mild peribronchial thickening which may relate to chronic bronchitis or smoking.   Original Report Authenticated By: Jeronimo Greaves, M.D.      Brief Hospital Course: 51 year old female with 41 year history of heavy smoking admitted for shortness of breath, cough and wheezing. Symptoms began on 12/23 and gradually worsen over the week.  She was last seen in our clinic on 12/27, started on doxycyline and prednisone, with no improvement of her symptoms. ED Course: Received Duoneb x 2  with no improvement. She was sent to CDU and started on 1 hour continuous albterol nebulizer and given Solu-medrol with little improvement. She was admitted and treated with Duoneb Q4 and continued on prednisone and Doxy. Pt never had an O2 requirement, she rapidly improved during hospital stay and was discharge home with resolution of her  symptoms. Pt was found to have elevated BP. She has had elevated BP readings on office visits. Started on HCTZ 25 mg and recommend to f/u as outpatient. Hyperglycemia: Most likely secondary to steroid use. Recommended to f/u as outpatient   Patient condition at time of discharge/disposition:  Patient is discharge home on stable medical condition.   Follow up issues: 1. Bronchitis possible COPD. Pt states that she already has appointment for PFT's  2. Hypertension. 3. Hyperglycemia.   Discharge follow up:  Discharge Orders    Future Appointments: Provider: Department: Dept Phone: Center:   01/25/2012 8:30 AM Macy Mis, MD [facility] [phone] [facility]      D. Piloto Rolene Arbour, MD  Patrcia Dolly [facility] 01/02/2012

## 2012-01-03 ENCOUNTER — Ambulatory Visit (INDEPENDENT_AMBULATORY_CARE_PROVIDER_SITE_OTHER): Payer: PRIVATE HEALTH INSURANCE | Admitting: Family Medicine

## 2012-01-03 ENCOUNTER — Encounter: Payer: Self-pay | Admitting: Family Medicine

## 2012-01-03 VITALS — BP 150/86 | HR 79 | Temp 97.8°F | Ht 60.0 in | Wt 160.8 lb

## 2012-01-03 DIAGNOSIS — G459 Transient cerebral ischemic attack, unspecified: Secondary | ICD-10-CM

## 2012-01-03 DIAGNOSIS — F17200 Nicotine dependence, unspecified, uncomplicated: Secondary | ICD-10-CM

## 2012-01-03 DIAGNOSIS — R0602 Shortness of breath: Secondary | ICD-10-CM | POA: Insufficient documentation

## 2012-01-03 DIAGNOSIS — R030 Elevated blood-pressure reading, without diagnosis of hypertension: Secondary | ICD-10-CM

## 2012-01-03 MED ORDER — IPRATROPIUM BROMIDE 0.02 % IN SOLN
0.5000 mg | Freq: Once | RESPIRATORY_TRACT | Status: AC
  Administered 2012-01-03: 0.5 mg via RESPIRATORY_TRACT

## 2012-01-03 MED ORDER — ALBUTEROL SULFATE (2.5 MG/3ML) 0.083% IN NEBU: 2.5000 mg | INHALATION_SOLUTION | Freq: Four times a day (QID) | RESPIRATORY_TRACT | Status: DC | PRN

## 2012-01-03 MED ORDER — ALBUTEROL SULFATE (2.5 MG/3ML) 0.083% IN NEBU
2.5000 mg | INHALATION_SOLUTION | Freq: Once | RESPIRATORY_TRACT | Status: AC
  Administered 2012-01-03: 2.5 mg via RESPIRATORY_TRACT

## 2012-01-03 NOTE — Discharge Summary (Signed)
I have reviewed this discharge summary and agree.    

## 2012-01-03 NOTE — Patient Instructions (Addendum)
If you need the nebulizer I'll give you a prescription for this.    Use the Qvar (new puffer) twice a day.  Do not use this one as needed.  Use the Albuterol when you need it. This is your rescue inhaler.    Come back on Thursday so we can make sure you're doing better.    Keep taking the Prednisone and antibiotic until they're all gone.

## 2012-01-03 NOTE — Progress Notes (Signed)
Patient ID: Betty Chapman, female   DOB: 1960-12-26, 51 y.o.   MRN: [id number] Betty Chapman is a 51 y.o. female who presents to [facility] today for hospital admission for COPD exacerbation:  1.  COPD Exacerbation:  Symptoms started a week ago last Monday, described as increasing cough and shortness of breath.  Progressed throughout the week. Seen in clinic last week and prescribed doxycycline as well as prednisone. I do not see that she was treated with a breathing treatment here in clinic. Presented to the emergency department the next day and was admitted. She did receive CAT treatment in the emergency room. She improved after several breathing treatments in the hospital and was discharged home the next day which was yesterday.  Last night she had trouble sleeping due to cough. She does continue to take her prednisone and doxycycline. She was started on Qvar while in house. However she does not have a clear understanding of when to take her medications. She has been taking her Qvar when "my breathing is really bad." She is used this 2 or 3 times and she left the hospital. She states that she cannot use her albuterol more than every 6 hours and did not know that she take it more than this. Last use to 4 AM this morning. Up several times throughout the night due to cough. No fevers or chills. No nausea or vomiting. No chest pain.   The following portions of the patient's history were reviewed and updated as appropriate: allergies, current medications, past medical history, family and social history, and problem list.  Patient is a nonsmoker.  Past Medical History  Diagnosis Date  . Tobacco abuse   . TIA (transient ischemic attack)     ROS as above otherwise neg. No Chest pain, palpitations, SOB, Fever, Chills, Abd pain, N/V/D.  Medications reviewed. Current Outpatient Prescriptions  Medication Sig Dispense Refill  . albuterol (VENTOLIN HFA) 108 (90 BASE) MCG/ACT inhaler Inhale 2 puffs into  the lungs every 6 (six) hours as needed for wheezing. Up to every 4-6 hours.  18 g  1  . beclomethasone (QVAR) 80 MCG/ACT inhaler Inhale 2 puffs into the lungs as needed.  1 Inhaler  12  . dextromethorphan (DELSYM) 30 MG/5ML liquid Take 10 mLs (60 mg total) by mouth 2 (two) times daily as needed for cough.  89 mL  0  . doxycycline (DORYX) 100 MG DR capsule Take 100 mg by mouth 2 (two) times daily. For 10 days Started 12/27      . guaiFENesin (MUCINEX) 600 MG 12 hr tablet Take 1 tablet (600 mg total) by mouth 2 (two) times daily.  30 tablet  0  . hydrochlorothiazide (HYDRODIURIL) 25 MG tablet Take 1 tablet (25 mg total) by mouth daily.  1 tablet  0  . predniSONE (DELTASONE) 50 MG tablet Take 50 mg by mouth daily. For 5 days Started 12/27        Exam:  BP 175/100  Pulse 79  Temp 97.8 F (36.6 C) (Oral)  Ht 5' (1.524 m)  Wt 160 lb 12.8 oz (72.938 kg)  BMI 31.40 kg/m2  LMP 12/27/2011 Gen: Well NAD HEENT: EOMI,  MMM Lungs: Diffuse wheezing with strong cough in all lungs fields.  No accessory muscle usage.  No crackles noted.  No increased work of breathing. Heart: RRR no MRG Abd: NABS, NT, ND Exts: Non edematous BL

## 2012-01-03 NOTE — Assessment & Plan Note (Signed)
Patient did NOT take her HCTZ today.  States she has had high BP's in past but when monitored at home they are normal. She might benefit from ambulatory BP monitoring.   If she does have high blood pressure, recommend ACE-I rather than thiazide as she has history of TIA.

## 2012-01-03 NOTE — Assessment & Plan Note (Signed)
Hx/o TIA noted.  See BP below.

## 2012-01-03 NOTE — Assessment & Plan Note (Signed)
Counseled to quit fully.   Explained how this would help with her shortness of breath.   She has PFTs upcoming with Pharmacy clinic.

## 2012-01-03 NOTE — Assessment & Plan Note (Addendum)
Provided Albuterol/Atrovent treatment here in clinic. Much improved s/p treatment - pulm exam s/p treatment revealed scattered wheezing in all lung fields but not nearly as diffuse.   See instructions - to continue with treatment, educated her on proper use of Qvar, FU later this week.  Provided written prescription for nebulizer machine along with vials sent in electronically.

## 2012-01-03 NOTE — Addendum Note (Signed)
Addended by: Jimmy Footman K on: 01/03/2012 12:19 PM   Modules accepted: Orders

## 2012-01-06 ENCOUNTER — Ambulatory Visit (INDEPENDENT_AMBULATORY_CARE_PROVIDER_SITE_OTHER): Payer: PRIVATE HEALTH INSURANCE | Admitting: Family Medicine

## 2012-01-06 ENCOUNTER — Encounter: Payer: Self-pay | Admitting: Family Medicine

## 2012-01-06 VITALS — BP 163/99 | HR 93 | Temp 97.9°F | Ht 60.0 in | Wt 158.0 lb

## 2012-01-06 DIAGNOSIS — R059 Cough, unspecified: Secondary | ICD-10-CM

## 2012-01-06 DIAGNOSIS — R062 Wheezing: Secondary | ICD-10-CM

## 2012-01-06 DIAGNOSIS — R05 Cough: Secondary | ICD-10-CM

## 2012-01-06 MED ORDER — SODIUM CHLORIDE 0.9 % IV SOLN: 125.0000 mg | Freq: Once | INTRAVENOUS | Status: DC

## 2012-01-06 MED ORDER — PREDNISONE 50 MG PO TABS: ORAL_TABLET | ORAL | Status: DC

## 2012-01-06 MED ORDER — ALBUTEROL SULFATE (2.5 MG/3ML) 0.083% IN NEBU
2.5000 mg | INHALATION_SOLUTION | Freq: Once | RESPIRATORY_TRACT | Status: AC
  Administered 2012-01-06: 2.5 mg via RESPIRATORY_TRACT

## 2012-01-06 MED ORDER — IPRATROPIUM BROMIDE 0.02 % IN SOLN
0.5000 mg | Freq: Once | RESPIRATORY_TRACT | Status: AC
  Administered 2012-01-06: 0.5 mg via RESPIRATORY_TRACT

## 2012-01-06 MED ORDER — MOXIFLOXACIN HCL 400 MG PO TABS: 400.0000 mg | ORAL_TABLET | Freq: Every day | ORAL | Status: DC

## 2012-01-06 MED ORDER — METHYLPREDNISOLONE SODIUM SUCC 125 MG IJ SOLR
125.0000 mg | Freq: Once | INTRAMUSCULAR | Status: AC
  Administered 2012-01-06: 125 mg via INTRAMUSCULAR

## 2012-01-06 NOTE — Patient Instructions (Addendum)
Take the Levaquin 1 tab daily for the next 7 days.  Take the Prednisone for the next 3 days after you finish your last pill from the old pack.  Prednisone shot today.  Come back next week so we can make sure you're doing okay.

## 2012-01-07 ENCOUNTER — Encounter: Payer: Self-pay | Admitting: Family Medicine

## 2012-01-07 NOTE — Progress Notes (Signed)
  Subjective:    Patient ID: [name], female    DOB: 1960-08-29, 52 y.o.   MRN: [id number]  HPI  1.  Cough:  Persists.  Not any real improvement since being seen on Monday.  Has been using nebulizer treatments 3-4 times a day at home with some relief but only lasts for a few hours.  Taking antibiotic and steroids.  Cough remains worse at night, dry hacking and non productive, awakens her from sleep.  No fevers or chills.    Review of Systems See HPI above for review of systems.       Objective:   Physical Exam  Gen:  Alert, cooperative patient who appears stated age in no acute distress.  Vital signs reviewed. HEENT:  EOMI, MMM Neck:  Supple, no LAD Heart:  RRR Lungs:  Diffuse wheezing in all lung fields Ext:  No edema.       Assessment & Plan:

## 2012-01-07 NOTE — Assessment & Plan Note (Signed)
Still treating like COPD exacerbation. Another breathing treatment here in clinic - pulm exam somewhat improved afterwards but still with wheezing.   Broadened Antibiotics to avelox. Provided solumedrol here in clinic plus 3 more days at home for total of 8 days. FU next week to ensure improvement.   Counseled to quit smoking fully

## 2012-01-11 ENCOUNTER — Encounter: Payer: PRIVATE HEALTH INSURANCE | Admitting: Family Medicine

## 2012-01-11 ENCOUNTER — Encounter: Payer: Self-pay | Admitting: Family Medicine

## 2012-01-11 ENCOUNTER — Ambulatory Visit (INDEPENDENT_AMBULATORY_CARE_PROVIDER_SITE_OTHER): Payer: PRIVATE HEALTH INSURANCE | Admitting: Family Medicine

## 2012-01-11 VITALS — BP 147/77 | HR 84 | Temp 98.3°F | Wt 164.6 lb

## 2012-01-11 DIAGNOSIS — R059 Cough, unspecified: Secondary | ICD-10-CM

## 2012-01-11 DIAGNOSIS — R05 Cough: Secondary | ICD-10-CM

## 2012-01-11 DIAGNOSIS — F17200 Nicotine dependence, unspecified, uncomplicated: Secondary | ICD-10-CM

## 2012-01-11 NOTE — Progress Notes (Signed)
  Subjective:    Patient ID: Betty Chapman, female    DOB: 30-Mar-1960, 52 y.o.   MRN: [id number]  HPI  52 yo here to follow-up on copd exacerbation  Was seen 5 days ago for continued cough/sputum during treatment for COPD exacerbation, antibiotic was changed to avelox and was given IM steroids.  Patient reports continued to finish antibiotics and prednisone.  She feels well with improvement of cough and resolution of sputum. She was using albuterol very frequently, now a few times per day.  Has continued to cut back smoking.  Reports decreasing from a high of 2 packs per day now down to 4-5 cigs per day.   Review of Systems See HPI    Objective:   Physical Exam GEN: Alert & Oriented, No acute distress CV:  Regular Rate & Rhythm, no murmur Respiratory:  Normal work of breathing, CTAB Abd:  + BS, soft, no tenderness to palpation Ext: no pre-tibial edema        Assessment & Plan:

## 2012-01-11 NOTE — Patient Instructions (Addendum)
Albuterol/Ventolin/Proventil is your emergency inhaler- just use as needed.  Continue to use your Qvar beclomethasone  Congratulations on cutting back smoking!

## 2012-01-12 NOTE — Assessment & Plan Note (Signed)
Is motivated, is aware of additional counseling available, would prefer to continue to work on her own.

## 2012-01-12 NOTE — Assessment & Plan Note (Signed)
COPD exacerbation improved.  Encouraged continued use of qbar and smoking cessation.  WIll schedule for PFT.

## 2012-01-25 ENCOUNTER — Encounter: Payer: PRIVATE HEALTH INSURANCE | Admitting: Family Medicine

## 2012-01-25 ENCOUNTER — Encounter: Payer: Self-pay | Admitting: Family Medicine

## 2012-01-25 ENCOUNTER — Ambulatory Visit (INDEPENDENT_AMBULATORY_CARE_PROVIDER_SITE_OTHER): Payer: PRIVATE HEALTH INSURANCE | Admitting: Family Medicine

## 2012-01-25 VITALS — BP 146/82 | HR 82 | Temp 98.0°F | Wt 162.0 lb

## 2012-01-25 DIAGNOSIS — I10 Essential (primary) hypertension: Secondary | ICD-10-CM

## 2012-01-25 DIAGNOSIS — R05 Cough: Secondary | ICD-10-CM

## 2012-01-25 DIAGNOSIS — R059 Cough, unspecified: Secondary | ICD-10-CM

## 2012-01-25 DIAGNOSIS — B354 Tinea corporis: Secondary | ICD-10-CM

## 2012-01-25 DIAGNOSIS — G459 Transient cerebral ischemic attack, unspecified: Secondary | ICD-10-CM

## 2012-01-25 MED ORDER — HYDROCHLOROTHIAZIDE 25 MG PO TABS: 25.0000 mg | ORAL_TABLET | Freq: Every day | ORAL | Status: DC

## 2012-01-25 NOTE — Progress Notes (Signed)
  Subjective:    Patient ID: Betty Chapman, female    DOB: 1960/01/12, 52 y.o.   MRN: [id number]  HPI Here for follow-up of cough  Cough: improved after course of avelox and prednisone.  Feels is at baseline.  Reports history of 1-2 episodes of bronchitis every year,.  This year had a hospitalization where she was started on qvar,.  Reports taking qvar as directed.  Albuterol not needed right now.  Rash;  In past two weeks notices itchy rash on abdomen.  noc hange in soaps.  Started after prednisone completed. No fever    Review of Systemssee HPI     Objective:   Physical Exam GEN: Alert & Oriented, No acute distress CV:  Regular Rate & Rhythm, no murmur Respiratory:  Normal work of breathing, CTAB Skin:  Left abdomen, faint rash, slightly scaly, circular pattern       Assessment & Plan:  Counseled patient on preventive healthcare,  She declines screening of vaccinations today

## 2012-01-25 NOTE — Patient Instructions (Addendum)
Your blood pressure is high today.  I would recommend restarting HCTZ given your history of possible stroke and past very high blood pressures.  We discussed recommended screening and vaccinations: flu, tetanus and petussis shot, colonoscopy and mammogram.  Our office would be happy to help you schedule these tests  Schedule appointment for fasting bloodwork- will check your cholesterol and fasting sugar for diabetes  Schedule appointment for spirometry with Dr. Raymondo Band to evaluate for COPD  I would take a baby aspiring each day- 81 mg  I will call you with results of your labwork, or send you a letter if all is normal  Try OTC cream such as lotrimin or tinactin for rash

## 2012-01-25 NOTE — Assessment & Plan Note (Signed)
Advised tinactin or Lamisil cream OTC

## 2012-01-25 NOTE — Assessment & Plan Note (Signed)
Patient reports not taking HCTZ.  Will start today given that BO remains high, off prednisone, and has been much higher at previous office visits and risk factor of history of TIA

## 2012-01-25 NOTE — Assessment & Plan Note (Signed)
Acute cough from hospitalization in dec 2013 resolved.  Reports history of bronchitis 1-2 times per year- ? Chronic cough.  Hospital team started QVAR.  Will refer for spirometry to better characterize possible copd.    Hx of tobacco use

## 2012-01-25 NOTE — Assessment & Plan Note (Signed)
I recommended daily aspirin and patient will schedule fasting bloodwork.

## 2012-02-08 ENCOUNTER — Encounter: Payer: Self-pay | Admitting: Pharmacist

## 2012-02-08 ENCOUNTER — Ambulatory Visit (INDEPENDENT_AMBULATORY_CARE_PROVIDER_SITE_OTHER): Payer: PRIVATE HEALTH INSURANCE | Admitting: Pharmacist

## 2012-02-08 VITALS — BP 129/84 | HR 80 | Ht 61.5 in | Wt 163.6 lb

## 2012-02-08 DIAGNOSIS — J441 Chronic obstructive pulmonary disease with (acute) exacerbation: Secondary | ICD-10-CM | POA: Insufficient documentation

## 2012-02-08 DIAGNOSIS — R059 Cough, unspecified: Secondary | ICD-10-CM

## 2012-02-08 DIAGNOSIS — F17200 Nicotine dependence, unspecified, uncomplicated: Secondary | ICD-10-CM

## 2012-02-08 DIAGNOSIS — J449 Chronic obstructive pulmonary disease, unspecified: Secondary | ICD-10-CM | POA: Insufficient documentation

## 2012-02-08 DIAGNOSIS — R05 Cough: Secondary | ICD-10-CM

## 2012-02-08 NOTE — Progress Notes (Signed)
Patient ID: Betty Chapman, female   DOB: 31-Jan-1960, 52 y.o.   MRN: [id number] Reviewed: Agree with Dr. Macky Lower documentation and management.

## 2012-02-08 NOTE — Assessment & Plan Note (Signed)
severe Nicotine Dependence of 40 years duration in a patient who is fair candidate for success b/c of current level of motivation following spirometry and successful tapering. She has been able to self taper from 2ppd to 5-7 cigs per day. Patient counseled on purpose, proper use, and potential adverse effects, including. Provided information on 1 800-QUIT NOW support program. Patient seen with Drue Stager.

## 2012-02-08 NOTE — Assessment & Plan Note (Addendum)
Spirometry evaluation reveals severe/very severe obstructive lung disease corresponding to GOLD Classification 4 based on spirometry and multiple exacerbations in the past year   Patient has been taking QVAR for 2 months with PRN albuterol. no change to treatment plan at this time.  Reviewed results of pulmonary function tests.  Pt verbalized understanding of results and education.  Written pt instructions provided.  F/U Clinic visit with PCP.   Total time in face to face counseling 30 minutes.   severe Nicotine Dependence of 40 years duration in a patient who is fair candidate for success b/c of current level of motivation following spirometry and successful tapering. She has been able to self taper from 2ppd to 5-7 cigs per day. Patient counseled on purpose, proper use, and potential adverse effects, including. Provided information on 1 800-QUIT NOW support program. Patient seen with Drue Stager.

## 2012-02-08 NOTE — Patient Instructions (Addendum)
1. Continue the good work with decreasing your smoking and stop completely on April 1st 2. Call the 1-800-quit-now for professional counseling to help quit smoking  3. Continue the QVAR twice daily, and if needed use the albuterol

## 2012-02-08 NOTE — Progress Notes (Signed)
  Subjective:    Patient ID: Betty Chapman, female    DOB: [birth year], 52 y.o.   MRN: [id number]  HPI    Review of Systems     Objective:   Physical Exam        Assessment & Plan:  Spirometry evaluation reveals severe/very severe obstructive lung disease corresponding to GOLD Classification 4 based on spirometry and multiple exacerbations in the past year   Patient has been taking QVAR for 2 months with PRN albuterol. no change to treatment plan at this time.  Reviewed results of pulmonary function tests.  Pt verbalized understanding of results and education.  Written pt instructions provided.  F/U Clinic visit with PCP.   Total time in face to face counseling 30 minutes.   severe Nicotine Dependence of 40 years duration in a patient who is fair candidate for success b/c of current level of motivation following spirometry and successful tapering. She has been able to self taper from 2ppd to 5-7 cigs per day. Patient counseled on purpose, proper use, and potential adverse effects, including. Provided information on 1 800-QUIT NOW support program. Patient seen with Drue Stager.

## 2012-07-05 ENCOUNTER — Ambulatory Visit (INDEPENDENT_AMBULATORY_CARE_PROVIDER_SITE_OTHER): Payer: PRIVATE HEALTH INSURANCE | Admitting: Family Medicine

## 2012-07-05 VITALS — BP 118/59 | HR 73 | Temp 99.0°F | Ht 60.0 in | Wt 150.0 lb

## 2012-07-05 DIAGNOSIS — R198 Other specified symptoms and signs involving the digestive system and abdomen: Secondary | ICD-10-CM

## 2012-07-05 DIAGNOSIS — K3189 Other diseases of stomach and duodenum: Secondary | ICD-10-CM

## 2012-07-05 DIAGNOSIS — R1013 Epigastric pain: Secondary | ICD-10-CM

## 2012-07-05 LAB — CBC WITH DIFFERENTIAL/PLATELET
Basophils Absolute: 0 10*3/uL (ref 0.0–0.1)
Basophils Relative: 0 % (ref 0–1)
Eosinophils Absolute: 0.2 10*3/uL (ref 0.0–0.7)
Eosinophils Relative: 3 % (ref 0–5)
HCT: 45.1 % (ref 36.0–46.0)
Hemoglobin: 15.5 g/dL — ABNORMAL HIGH (ref 12.0–15.0)
Lymphocytes Relative: 40 % (ref 12–46)
Lymphs Abs: 3 10*3/uL (ref 0.7–4.0)
MCH: 30.9 pg (ref 26.0–34.0)
MCHC: 34.4 g/dL (ref 30.0–36.0)
MCV: 89.8 fL (ref 78.0–100.0)
Monocytes Absolute: 0.6 10*3/uL (ref 0.1–1.0)
Monocytes Relative: 7 % (ref 3–12)
Neutro Abs: 3.7 10*3/uL (ref 1.7–7.7)
Neutrophils Relative %: 50 % (ref 43–77)
Platelets: 294 10*3/uL (ref 150–400)
RBC: 5.02 MIL/uL (ref 3.87–5.11)
RDW: 14.7 % (ref 11.5–15.5)
WBC: 7.5 10*3/uL (ref 4.0–10.5)

## 2012-07-05 MED ORDER — SUCRALFATE 1 G PO TABS: 1.0000 g | ORAL_TABLET | Freq: Four times a day (QID) | ORAL | Status: DC

## 2012-07-05 MED ORDER — PANTOPRAZOLE SODIUM 40 MG PO TBEC: 80.0000 mg | DELAYED_RELEASE_TABLET | Freq: Every day | ORAL | Status: DC

## 2012-07-05 MED ORDER — PANTOPRAZOLE SODIUM 40 MG PO TBEC: 40.0000 mg | DELAYED_RELEASE_TABLET | Freq: Every day | ORAL | Status: DC

## 2012-07-05 NOTE — Progress Notes (Signed)
  Subjective:    Patient ID: Betty Chapman, female    DOB: Dec 05, 1960, 52 y.o.   MRN: [id number]  HPI Pt here complaining of dull epigastric pain for about 4 weeks. She has had constant tenesmus but still having BMs per her regular routine of 3-4 daily. For the last 3 days she has had abd distention off and on, emesis after she eats inconsistently, and continued tenesmus. She also has pain with pushing ion her belly, especially when it is "swollen".   She describes her emesis as brown liquid without solids at all, she denies hematochezia, melena and bloody emesis. She denies weight loss.   She has smokes for 42 years, up to 1-2 ppd at one time and is now down to 1/3 ppd She has a Hx of PUD in her 85s when she had a Cscope and EGD, which only showed ulcers. Zantac helped well at that time.   Review of Systems Per HPI    Objective:   Physical Exam  Gen: NAD, alert, cooperative with exam CV: RRR, good S1/S2, no murmur Resp: CTABL, no wheezes, non-labored Abd: Soft, mild tenderness to palpation of epigastric area, no scars or lesions visible.  Ext: No edema, warm Neuro: Alert and oriented, No gross deficits, normal gait' Rectal- no visible hemorrhoids, no mass palpated with digital exam, no obvious blood, occult stool card negative.      Assessment & Plan:

## 2012-07-05 NOTE — Assessment & Plan Note (Signed)
See dyspepsia, with other symptoms will refer to GI

## 2012-07-05 NOTE — Assessment & Plan Note (Addendum)
With tenesmus, smoking, tenderness to palpation, and slight weight loss in 5 months i'm worried enough about malignancy for a referral.  Weight loss of 163 to 150 lb without effort, lowest weight in last 1 year 157 With Hx of PUD and Sx will treat for PUD with PPI and Carafate Referral to GI for screening C scope and possible EGD per their discretion.  With report of brown emesis will do CBC   40 mg protonix daily, 1 g carafate QID Follow up 4 weeks, red flags reviewed

## 2012-07-05 NOTE — Patient Instructions (Signed)
I have made a GI referral for colonoscopy and consideration of an EGD (the scope from above)  I have also started a new medicine, protonix, which will decrease the acid in your stomach so an ulcer can heal I have given an Rx for carafate which may help soothe your stomach  Peptic Ulcer A peptic ulcer is a sore in the lining of in your esophagus (esophageal ulcer), stomach (gastric ulcer), or in the first part of your small intestine (duodenal ulcer). The ulcer causes erosion into the deeper tissue. CAUSES  Normally, the lining of the stomach and the small intestine protects itself from the acid that digests food. The protective lining can be damaged by:  An infection caused by a bacterium called Helicobacter pylori (H. pylori).  Regular use of nonsteroidal anti-inflammatory drugs (NSAIDs), such as ibuprofen or aspirin.  Smoking tobacco. Other risk factors include being older than 50, drinking alcohol excessively, and having a family history of ulcer disease.  SYMPTOMS   Burning pain or gnawing in the area between the chest and the belly button.  Heartburn.  Nausea and vomiting.  Bloating. The pain can be worse on an empty stomach and at night. If the ulcer results in bleeding, it can cause:  Black, tarry stools.  Vomiting of bright red blood.  Vomiting of coffee ground looking materials. DIAGNOSIS  A diagnosis is usually made based upon your history and an exam. Other tests and procedures may be performed to find the cause of the ulcer. Finding a cause will help determine the best treatment. Tests and procedures may include:  Blood tests, stool tests, or breath tests to check for the bacterium H. pylori.  An upper gastrointestinal (GI) series of the esophagus, stomach, and small intestine.  An endoscopy to examine the esophagus, stomach, and small intestine.  A biopsy. TREATMENT  Treatment may include:  Eliminating the cause of the ulcer, such as smoking, NSAIDs, or  alcohol.  Medicines to reduce the amount of acid in your digestive tract.  Antibiotic medicines if the ulcer is caused by the H. pylori bacterium.  An upper endoscopy to treat a bleeding ulcer.  Surgery if the bleeding is severe or if the ulcer created a hole somewhere in the digestive system. HOME CARE INSTRUCTIONS   Avoid tobacco, alcohol, and caffeine. Smoking can increase the acid in the stomach, and continued smoking will impair the healing of ulcers.  Avoid foods and drinks that seem to cause discomfort or aggravate your ulcer.  Only take medicines as directed by your caregiver. Do not substitute over-the-counter medicines for prescription medicines without talking to your caregiver.  Keep any follow-up appointments and tests as directed. SEEK MEDICAL CARE IF:   Your do not improve within 7 days of starting treatment.  You have ongoing indigestion or heartburn. SEEK IMMEDIATE MEDICAL CARE IF:   You have sudden, sharp, or persistent abdominal pain.  You have bloody or dark black, tarry stools.  You vomit blood or vomit that looks like coffee grounds.  You become light headed, weak, or feel faint.  You become sweaty or clammy. MAKE SURE YOU:   Understand these instructions.  Will watch your condition.  Will get help right away if you are not doing well or get worse. Document Released: 12/19/1999 Document Revised: 09/15/2011 Document Reviewed: 07/21/2011 [facility] Patient Information 2014 [place], [place].

## 2012-07-06 ENCOUNTER — Encounter: Payer: Self-pay | Admitting: Family Medicine

## 2012-10-23 ENCOUNTER — Emergency Department (HOSPITAL_COMMUNITY)
Admission: EM | Admit: 2012-10-23 | Discharge: 2012-10-23 | Discharge status: Against Medical Advice | Payer: PRIVATE HEALTH INSURANCE | Source: Home / Self Care | Attending: Family Medicine | Admitting: Family Medicine

## 2012-10-23 ENCOUNTER — Emergency Department (HOSPITAL_COMMUNITY): Payer: PRIVATE HEALTH INSURANCE

## 2012-10-23 ENCOUNTER — Emergency Department (HOSPITAL_COMMUNITY)
Admission: EM | Admit: 2012-10-23 | Discharge: 2012-10-23 | Disposition: A | Discharge status: Home / Self Care | Payer: PRIVATE HEALTH INSURANCE | Attending: Emergency Medicine | Admitting: Emergency Medicine

## 2012-10-23 ENCOUNTER — Encounter (HOSPITAL_COMMUNITY): Payer: Self-pay | Admitting: Emergency Medicine

## 2012-10-23 DIAGNOSIS — Z7982 Long term (current) use of aspirin: Secondary | ICD-10-CM | POA: Insufficient documentation

## 2012-10-23 DIAGNOSIS — Z79899 Other long term (current) drug therapy: Secondary | ICD-10-CM | POA: Insufficient documentation

## 2012-10-23 DIAGNOSIS — IMO0001 Reserved for inherently not codable concepts without codable children: Secondary | ICD-10-CM | POA: Insufficient documentation

## 2012-10-23 DIAGNOSIS — M546 Pain in thoracic spine: Secondary | ICD-10-CM | POA: Insufficient documentation

## 2012-10-23 DIAGNOSIS — M545 Low back pain, unspecified: Secondary | ICD-10-CM | POA: Insufficient documentation

## 2012-10-23 DIAGNOSIS — M25579 Pain in unspecified ankle and joints of unspecified foot: Secondary | ICD-10-CM | POA: Insufficient documentation

## 2012-10-23 DIAGNOSIS — M79609 Pain in unspecified limb: Secondary | ICD-10-CM | POA: Insufficient documentation

## 2012-10-23 DIAGNOSIS — Z8673 Personal history of transient ischemic attack (TIA), and cerebral infarction without residual deficits: Secondary | ICD-10-CM | POA: Insufficient documentation

## 2012-10-23 DIAGNOSIS — F17200 Nicotine dependence, unspecified, uncomplicated: Secondary | ICD-10-CM | POA: Insufficient documentation

## 2012-10-23 MED ORDER — METHOCARBAMOL 500 MG PO TABS: 500.0000 mg | ORAL_TABLET | Freq: Two times a day (BID) | ORAL | Status: DC

## 2012-10-23 MED ORDER — MELOXICAM 7.5 MG PO TABS: 15.0000 mg | ORAL_TABLET | Freq: Every day | ORAL | Status: DC

## 2012-10-23 MED ORDER — KETOROLAC TROMETHAMINE 30 MG/ML IJ SOLN
60.0000 mg | Freq: Once | INTRAMUSCULAR | Status: AC
  Administered 2012-10-23: 60 mg via INTRAMUSCULAR
  Filled 2012-10-23: qty 2

## 2012-10-23 NOTE — ED Notes (Signed)
Pt c/o lower back pain starting Friday but states she has a hx of sciatica and the pain usually travels down her Left leg. Pt also reports stepping on some twigs several months ago with her Left foot and feels like she has something in there

## 2012-10-23 NOTE — ED Notes (Signed)
According to the patient he has been throwing up since 0300 in the morning he has also had diarrhea once.  Today he started having cramping in the arms, hands and his legs and said he could not speak correctly so he came to the ED.  In triage he became belligerent cussing and saying he needed to drink water.  Patient was not having any trouble speaking.  Patient is here to be evaluated.  

## 2012-10-23 NOTE — ED Notes (Signed)
Pt presents with lower back pain. 

## 2012-10-23 NOTE — ED Provider Notes (Signed)
ED Notes, ED Provider Notes from 10/23/12 0000 to 10/23/12 14:14:33    Quinn Axe, [facility] 10/23/2012 14:13    C/o lower back pain with sharp shooting pain down left leg. Hx of sciatica. Left foot pain. States feels like something is in foot. Pt Has not tried any otc meds for symptoms.  Off/on for a while. Became more severe on Friday.       LWOBS   Graylon Good, PA-C 10/23/12 1443

## 2012-10-23 NOTE — ED Provider Notes (Signed)
CSN: [id number]     Arrival date & time 10/23/12  1442 History  This chart was scribed for non-physician practitioner, Antony Madura, PA-C working with Hilario Quarry, MD by Greggory Stallion, ED scribe. This patient was seen in room TR11C/TR11C and the patient's care was started at 4:26 PM.   Chief Complaint  Patient presents with  . Back Pain  . Foot Pain   The history is provided by the patient. No language interpreter was used.   HPI Comments: Betty Chapman is a 52 y.o. female with who presents to the Emergency Department complaining of sharp, stabbing, aching lower back pain that started 3 days ago. She states the pain radiates into her left leg and into her mid back. Movement worsens the pain. Pt has history of back pain for about 3 years due to her sciatic nerve. She has not taken anything for her pain. She denies fever, bowel or bladder incontinence, dysuria, hematuria, leg numbness, genital numbness, and not being able to walk. Pt states she also thinks she has a twig stuck in the bottom of her foot from stepping on twigs at home. She denies history of IV drug use or cancer. Pt denies history of back surgery.   Past Medical History  Diagnosis Date  . Tobacco abuse   . TIA (transient ischemic attack)    Past Surgical History  Procedure Laterality Date  . Btl    . Cholecystectomy    . Tubal ligation     Family History  Problem Relation Age of Onset  . Aneurysm Mother     brain, cause of death   History  Substance Use Topics  . Smoking status: Current Every Day Smoker -- 0.50 packs/day    Types: Cigarettes  . Smokeless tobacco: Former Neurosurgeon  . Alcohol Use: Yes     Comment: occasonally   OB History   Grav Para Term Preterm Abortions TAB SAB Ect Mult Living                 Review of Systems  Constitutional: Negative for fever.  Genitourinary: Negative for dysuria and hematuria.       Negative for bowel or bladder incontinence.   Musculoskeletal: Positive for back  pain and myalgias.  Neurological: Negative for numbness.  All other systems reviewed and are negative.    Allergies  Mushroom extract complex  Home Medications   Current Outpatient Rx  Name  Route  Sig  Dispense  Refill  . albuterol (VENTOLIN HFA) 108 (90 BASE) MCG/ACT inhaler   Inhalation   Inhale 2 puffs into the lungs every 6 (six) hours as needed for wheezing. Up to every 4-6 hours.   18 g   1   . aspirin 81 MG tablet   Oral   Take 81 mg by mouth daily.         . beclomethasone (QVAR) 80 MCG/ACT inhaler   Inhalation   Inhale 2 puffs into the lungs 2 (two) times daily.         . meloxicam (MOBIC) 7.5 MG tablet   Oral   Take 2 tablets (15 mg total) by mouth daily.   30 tablet   0   . methocarbamol (ROBAXIN) 500 MG tablet   Oral   Take 1 tablet (500 mg total) by mouth 2 (two) times daily.   20 tablet   0    BP 147/84  Pulse 77  Temp(Src) 98 F (36.7 C) (Oral)  Resp 18  Ht 5' (1.524 m)  Wt 158 lb (71.668 kg)  BMI 30.86 kg/m2  SpO2 99%  LMP 12/27/2011  Physical Exam  Nursing note and vitals reviewed. Constitutional: She is oriented to person, place, and time. She appears well-developed and well-nourished. No distress.  HENT:  Head: Normocephalic and atraumatic.  Eyes: Conjunctivae and EOM are normal. No scleral icterus.  Neck: Normal range of motion.  Cardiovascular: Normal rate, regular rhythm and intact distal pulses.   Distal and radial pulses 2+ bilaterally.   Pulmonary/Chest: Effort normal. No respiratory distress.  Musculoskeletal: Normal range of motion.  Negative straight leg raise and cross straight leg raise. Full ROM of back. No tenderness to thoracic or lumbosacral midline. Tenderness to palpation of right lumbosacral muscles.   Neurological: She is alert and oriented to person, place, and time.  DTRs normal and symmetric. No sensory or motor deficits appreciated. Patient ambulatory without assistance and moves extremities without ataxia.   Skin: Skin is warm and dry. No rash noted. She is not diaphoretic. No erythema. No pallor.  Psychiatric: She has a normal mood and affect. Her behavior is normal.    ED Course  Procedures (including critical care time)  DIAGNOSTIC STUDIES: Oxygen Saturation is 99% on RA, normal by my interpretation.    COORDINATION OF CARE: 4:33 PM-Discussed treatment plan which includes xray and a muscle relaxer with pt at bedside and pt agreed to plan. Advised pt to follow up with podiatry.   Labs Review Labs Reviewed - No data to display  Dg Foot 2 Views Left  10/23/2012   CLINICAL DATA:  Soreness and pain in the region of the 3rd and 4th metatarsals.  EXAM: LEFT FOOT - 2 VIEW  COMPARISON:  None.  FINDINGS: There is no evidence of fracture or dislocation. There is no evidence of arthropathy or other focal bone abnormality. Soft tissues are unremarkable.  IMPRESSION: Negative.   Electronically Signed   By: Kennith Center M.D.   On: 10/23/2012 17:46   EKG Interpretation   None       MDM   1. Low back pain    Patient with hx of back pain presents for worsening back pain. Patient well and nontoxic appearing, afebrile and hemodynamically stable as well as neurovascularly intact. Patient with full ROM of back and ambulatory. No hx of trauma or injury. No hx of CA or IVDU. No red flags or signs concerning for cauda equina. Do not believe emergent imaging is indicated at this time. Patient appropriate for d/c with Rx for meloxicam and robaxin for symptoms. Return precautions discussed and patient agreeable to plan with no unaddressed concerns.  I personally performed the services described in this documentation, which was scribed in my presence. The recorded information has been reviewed and is accurate.    Antony Madura, PA-C 11/04/12 1820

## 2012-10-23 NOTE — ED Notes (Signed)
C/o lower back pain with sharp shooting pain down left leg. Hx of sciatica.   Left foot pain. States feels like something is in foot.   Pt  Has not tried any otc meds for symptoms.   Off/on for a while. Became more severe on Friday.

## 2012-10-25 NOTE — ED Provider Notes (Signed)
Medical screening examination/treatment/procedure(s) were performed by resident physician or non-physician practitioner and as supervising physician I was immediately available for consultation/collaboration.   KINDL,JAMES DOUGLAS MD.   James D Kindl, MD 10/25/12 2103 

## 2012-11-05 NOTE — ED Provider Notes (Signed)
History/physical exam/procedure(s) were performed by non-physician practitioner and as supervising physician I was immediately available for consultation/collaboration. I have reviewed all notes and am in agreement with care and plan.   [place] Skalicky S Romin Divita, MD 11/05/12 1559 

## 2013-10-31 ENCOUNTER — Ambulatory Visit: Payer: Self-pay | Attending: Family Medicine

## 2013-12-03 ENCOUNTER — Ambulatory Visit: Payer: Self-pay | Attending: Internal Medicine

## 2013-12-09 ENCOUNTER — Emergency Department (HOSPITAL_COMMUNITY)
Admission: EM | Admit: 2013-12-09 | Discharge: 2013-12-09 | Disposition: A | Discharge status: Home / Self Care | Payer: Self-pay | Attending: Emergency Medicine | Admitting: Emergency Medicine

## 2013-12-09 ENCOUNTER — Encounter (HOSPITAL_COMMUNITY): Payer: Self-pay | Admitting: *Deleted

## 2013-12-09 DIAGNOSIS — Z7982 Long term (current) use of aspirin: Secondary | ICD-10-CM | POA: Insufficient documentation

## 2013-12-09 DIAGNOSIS — S39012A Strain of muscle, fascia and tendon of lower back, initial encounter: Secondary | ICD-10-CM | POA: Insufficient documentation

## 2013-12-09 DIAGNOSIS — Z791 Long term (current) use of non-steroidal anti-inflammatories (NSAID): Secondary | ICD-10-CM | POA: Insufficient documentation

## 2013-12-09 DIAGNOSIS — Y9389 Activity, other specified: Secondary | ICD-10-CM | POA: Insufficient documentation

## 2013-12-09 DIAGNOSIS — Y998 Other external cause status: Secondary | ICD-10-CM | POA: Insufficient documentation

## 2013-12-09 DIAGNOSIS — M1990 Unspecified osteoarthritis, unspecified site: Secondary | ICD-10-CM | POA: Insufficient documentation

## 2013-12-09 DIAGNOSIS — Z8673 Personal history of transient ischemic attack (TIA), and cerebral infarction without residual deficits: Secondary | ICD-10-CM | POA: Insufficient documentation

## 2013-12-09 DIAGNOSIS — Z720 Tobacco use: Secondary | ICD-10-CM | POA: Insufficient documentation

## 2013-12-09 DIAGNOSIS — X58XXXA Exposure to other specified factors, initial encounter: Secondary | ICD-10-CM | POA: Insufficient documentation

## 2013-12-09 DIAGNOSIS — Y9289 Other specified places as the place of occurrence of the external cause: Secondary | ICD-10-CM | POA: Insufficient documentation

## 2013-12-09 DIAGNOSIS — Z79899 Other long term (current) drug therapy: Secondary | ICD-10-CM | POA: Insufficient documentation

## 2013-12-09 MED ORDER — IBUPROFEN 800 MG PO TABS
800.0000 mg | ORAL_TABLET | Freq: Once | ORAL | Status: AC
  Administered 2013-12-09: 800 mg via ORAL
  Filled 2013-12-09: qty 1

## 2013-12-09 MED ORDER — MELOXICAM 7.5 MG PO TABS: 15.0000 mg | ORAL_TABLET | Freq: Every day | ORAL | Status: DC

## 2013-12-09 MED ORDER — METHOCARBAMOL 500 MG PO TABS: 500.0000 mg | ORAL_TABLET | Freq: Two times a day (BID) | ORAL | Status: DC

## 2013-12-09 NOTE — Discharge Instructions (Signed)

## 2013-12-09 NOTE — ED Provider Notes (Signed)
CSN: [id number]     Arrival date & time 12/09/13  0612 History   First MD Initiated Contact with Patient 12/09/13 [phone]     Chief Complaint  Patient presents with  . Back Pain     (Consider location/radiation/quality/duration/timing/severity/associated sxs/prior Treatment) HPI  53 year old female with hx of recurrent back pain presents to ER with complaint of back pain.  Patient report of gradual onset of episodic back pain which started yesterday. She described pain as a sharp and achy sensation radiating across the lower back and up her spine. Pain is worsening with positional change and movement and improves when she rest. Pain is moderate in severity, felt similar to back pain that she had a month ago however states it does not radiates down to her leg. No specific treatment tried. She denies any associated fever, chills, abdominal pain, dysuria, hematuria, bowel bladder incontinence, saddle paresthesia, or rash. No prior history of IV drug use active cancer. No prior history of back surgery. Patient drove here to the ER.      Past Medical History  Diagnosis Date  . Tobacco abuse   . TIA (transient ischemic attack)    Past Surgical History  Procedure Laterality Date  . Btl    . Cholecystectomy    . Tubal ligation     Family History  Problem Relation Age of Onset  . Aneurysm Mother     brain, cause of death   History  Substance Use Topics  . Smoking status: Current Every Day Smoker -- 0.50 packs/day    Types: Cigarettes  . Smokeless tobacco: Former Systems developer  . Alcohol Use: Yes     Comment: occasonally   OB History    No data available     Review of Systems  Constitutional: Negative for fever.  Genitourinary: Negative for dyspareunia.  Musculoskeletal: Positive for back pain.  Skin: Negative for rash and wound.  Neurological: Negative for numbness.      Allergies  Mushroom extract complex  Home Medications   Prior to Admission medications   Medication Sig  Start Date End Date Taking? Authorizing Provider  albuterol (VENTOLIN HFA) 108 (90 BASE) MCG/ACT inhaler Inhale 2 puffs into the lungs every 6 (six) hours as needed for wheezing. Up to every 4-6 hours. 06/01/11   Shanda Howells, MD  aspirin 81 MG tablet Take 81 mg by mouth daily.    Historical Provider, MD  beclomethasone (QVAR) 80 MCG/ACT inhaler Inhale 2 puffs into the lungs 2 (two) times daily. 01/02/12   Dayarmys Piloto de Gwendalyn Ege, MD  meloxicam (MOBIC) 7.5 MG tablet Take 2 tablets (15 mg total) by mouth daily. 10/23/12   Antonietta Breach, PA-C  methocarbamol (ROBAXIN) 500 MG tablet Take 1 tablet (500 mg total) by mouth 2 (two) times daily. 10/23/12   Antonietta Breach, PA-C   BP 168/84 mmHg  Pulse 98  Resp 22  Ht 5' (1.524 m)  Wt 155 lb (70.308 kg)  BMI 30.27 kg/m2  SpO2 100%  LMP 12/27/2011 Physical Exam  Constitutional: She appears well-developed and well-nourished. No distress.  HENT:  Head: Atraumatic.  Eyes: Conjunctivae are normal.  Neck: Neck supple.  Cardiovascular: Intact distal pulses.   Abdominal: Soft. There is no tenderness.  Musculoskeletal: She exhibits tenderness (tenderness to paralumbar spinal muscle and along the lumbosacral region to palpation without any overlying skin changes. Decreased back flexion extension and rotation secondary to pain.).  Negative straight leg raise bilaterally. Able to ambulate. Patellar deep tendon reflex intact  bilaterally, no foot drops, intact distal pedal pulses.  Neurological: She is alert.  Skin: No rash noted.  Psychiatric: She has a normal mood and affect.  Nursing note and vitals reviewed.   ED Course  Procedures (including critical care time)   No red flag s/s of low back pain. Patient was counseled on back pain precautions and told to do activity as tolerated but do not lift, push, or pull heavy objects more than 10 pounds for the next week. Patient counseled to use ice or heat on back for no longer than 15 minutes every hour.    Patient prescribed muscle relaxer and counseled on proper use of muscle relaxant medication.  Patient prescribed non narcotic pain medicine and counseled on proper use of non narcotic pain medications.  Counseled not to combine this medication with others containing tylenol.  Urged patient not to drink alcohol, drive, or perform any other activities that requires focus while taking either of these medications.   Patient urged to follow-up with PCP if pain does not improve with treatment and rest or if pain becomes recurrent. Urged to return with worsening severe pain, loss of bowel or bladder control, trouble walking.  The patient verbalizes understanding and agrees with the plan.   Labs Review Labs Reviewed - No data to display  Imaging Review No results found.   EKG Interpretation None      MDM   Final diagnoses:  Low back strain, initial encounter    BP 168/84 mmHg  Pulse 98  Resp 22  Ht 5' (1.524 m)  Wt 155 lb (70.308 kg)  BMI 30.27 kg/m2  SpO2 98%  LMP 12/27/2011 Blood pressure is elevated, patient recommended to follow-up with PCP next week for recheck.    Domenic Moras, PA-C 12/09/13 [place], MD 12/09/13 [phone]

## 2013-12-09 NOTE — ED Notes (Signed)
Patient with complaints of lower back pain.  She has hx of arthritis.  She denies any trauma.  Patient has not taken any pain meds prior to arrival.  Patient able to ambulate but slowly

## 2014-01-18 ENCOUNTER — Telehealth: Payer: Self-pay | Admitting: *Deleted

## 2014-01-18 ENCOUNTER — Encounter: Payer: Self-pay | Admitting: Family Medicine

## 2014-01-18 ENCOUNTER — Ambulatory Visit (INDEPENDENT_AMBULATORY_CARE_PROVIDER_SITE_OTHER): Payer: Self-pay | Admitting: Family Medicine

## 2014-01-18 ENCOUNTER — Other Ambulatory Visit: Payer: Self-pay | Admitting: Family Medicine

## 2014-01-18 VITALS — BP 159/86 | HR 73 | Temp 98.3°F | Ht 60.0 in | Wt 168.0 lb

## 2014-01-18 DIAGNOSIS — R1310 Dysphagia, unspecified: Secondary | ICD-10-CM

## 2014-01-18 DIAGNOSIS — B372 Candidiasis of skin and nail: Secondary | ICD-10-CM | POA: Insufficient documentation

## 2014-01-18 DIAGNOSIS — J449 Chronic obstructive pulmonary disease, unspecified: Secondary | ICD-10-CM

## 2014-01-18 DIAGNOSIS — I10 Essential (primary) hypertension: Secondary | ICD-10-CM

## 2014-01-18 DIAGNOSIS — Z0000 Encounter for general adult medical examination without abnormal findings: Secondary | ICD-10-CM | POA: Insufficient documentation

## 2014-01-18 MED ORDER — NYSTATIN-TRIAMCINOLONE 100000-0.1 UNIT/GM-% EX CREA: 1.0000 "application " | TOPICAL_CREAM | Freq: Two times a day (BID) | CUTANEOUS | Status: DC

## 2014-01-18 MED ORDER — CHLORTHALIDONE 25 MG PO TABS: 25.0000 mg | ORAL_TABLET | Freq: Every day | ORAL | Status: DC

## 2014-01-18 MED ORDER — ALBUTEROL SULFATE HFA 108 (90 BASE) MCG/ACT IN AERS: 2.0000 | INHALATION_SPRAY | Freq: Four times a day (QID) | RESPIRATORY_TRACT | Status: DC | PRN

## 2014-01-18 MED ORDER — BECLOMETHASONE DIPROPIONATE 80 MCG/ACT IN AERS: 2.0000 | INHALATION_SPRAY | Freq: Two times a day (BID) | RESPIRATORY_TRACT | Status: DC

## 2014-01-18 MED ORDER — NYSTATIN-TRIAMCINOLONE 100000-0.1 UNIT/GM-% EX OINT: 1.0000 "application " | TOPICAL_OINTMENT | Freq: Two times a day (BID) | CUTANEOUS | Status: DC

## 2014-01-18 NOTE — Telephone Encounter (Signed)
[facility] requesting to change nystatin ointment to cream.  If change is ok; please send new Rx.  Derl Barrow, RN

## 2014-01-18 NOTE — Assessment & Plan Note (Signed)
Due to FHx of cancer and her smoking hx I recommended GI assessment. Referral done today.

## 2014-01-18 NOTE — Progress Notes (Signed)
Subjective:     Patient ID: Betty Chapman, female   DOB: 08/25/1960, 54 y.o.   MRN: [id number]  HPI  HTN: Patient gave hx of elevated BP in the past but her BP has been good. She stated she was on medication in the past but discontinued it. Denies any concern related to her BP today. COPD: SOB worsening gradually, stopping in between time to get breath back. Worsening with ambulation. She is wheezing,occasional chest tightness, she is constantly coughing. No sick contact, no fever. Skin rash: Rash under her breast and thigh creases. This is constant, going on for 2 months. Now bruising or peeling. This is sometimes itchy. She uses alcohol pad and powder on it after she bath. Difficulty swallowing:At times whenever she is eating or drinking it stops in her throat area, she tries to use water to push her food down. This is painful as well, going on for few months. Denies any weight loss. She is currently a smoker. Smoking: Smoked for 40 yrs. She 1 pack every 3-4 days, she had cut back a lot.She has been trying to quit for years.She will not like to try pharmacologic agent to quit. HM: Has not had PAP, mammogram or colonoscopy done.  Current Outpatient Prescriptions on File Prior to Visit  Medication Sig Dispense Refill  . albuterol (PROVENTIL) (2.5 MG/3ML) 0.083% nebulizer solution Take 2.5 mg by nebulization every 6 (six) hours as needed for wheezing or shortness of breath.    [name] albuterol (VENTOLIN HFA) 108 (90 BASE) MCG/ACT inhaler Inhale 2 puffs into the lungs every 6 (six) hours as needed for wheezing. Up to every 4-6 hours. (Patient not taking: Reported on 01/18/2014) 18 g 1  . aspirin 81 MG tablet Take 81 mg by mouth daily.    . beclomethasone (QVAR) 80 MCG/ACT inhaler Inhale 2 puffs into the lungs 2 (two) times daily.     No current facility-administered medications on file prior to visit.   Past Medical History  Diagnosis Date  . Tobacco abuse   . TIA (transient ischemic attack)        Review of Systems  HENT: Positive for trouble swallowing.   Respiratory: Negative.   Cardiovascular: Negative.   Gastrointestinal: Negative.   Skin: Positive for rash.  All other systems reviewed and are negative.  Filed Vitals:   01/18/14 0912 01/18/14 0922  BP: 162/94 159/86  Pulse: 73   Temp: 98.3 F (36.8 C)   TempSrc: Oral   Height: 5' (1.524 m)   Weight: 168 lb (76.204 kg)   SpO2: 96%        Objective:   Physical Exam  Constitutional: She is oriented to person, place, and time. She appears well-developed. No distress.  HENT:  Mouth/Throat: Oropharynx is clear and moist. No oropharyngeal exudate.  Neck: Neck supple.  Cardiovascular: Normal rate, regular rhythm and normal heart sounds.   No murmur heard. Pulmonary/Chest: Effort normal and breath sounds normal. No respiratory distress. She has no wheezes.  Abdominal: Soft. Bowel sounds are normal. She exhibits no distension and no mass. There is no tenderness.  Musculoskeletal: Normal range of motion. She exhibits no edema.  Neurological: She is alert and oriented to person, place, and time. No cranial nerve deficit.  Skin:     Nursing note and vitals reviewed.      Assessment:     HTN COPD [facility] maintenance.     Plan:     Check problem list.

## 2014-01-18 NOTE — Assessment & Plan Note (Signed)
GI referral done for colonoscopy. Instruction given on how to schedule mammogram. RTC in 2 wks for PAP.

## 2014-01-18 NOTE — Patient Instructions (Addendum)
It was nice meeting you today. I am sorry about your cough and SOB. This is due to COPD. Please restart your medication which I sent to the pharmacy already. Also I have sent referral to the gastroenterologist for your swallowing difficulty. You are also due for colonoscopy , PAP and mammogram. Please schedule mammogram soon and see me back in 2-3 wks for PAP.

## 2014-01-18 NOTE — Assessment & Plan Note (Signed)
Nystatin prescribed

## 2014-01-18 NOTE — Telephone Encounter (Signed)
Refill done.  Thanks

## 2014-01-18 NOTE — Assessment & Plan Note (Signed)
Was on HCTZ in the past. I started her on Chlorthalidone. She agreed to restart medication. I will see her back in 2-4 wks. Continue homw BP monitoring.

## 2014-01-18 NOTE — Assessment & Plan Note (Signed)
Without exacerbation. O2 sat on RA is 96%. Normal pulmonary exam. Out of albuterol for months, I refilled it today. I started her on Qvar as well. I will reassess her at next visit. Might benefit from seeing Dr Valentina Lucks for PFT.

## 2014-01-21 ENCOUNTER — Encounter: Payer: Self-pay | Admitting: Internal Medicine

## 2014-02-04 ENCOUNTER — Telehealth: Payer: Self-pay | Admitting: Family Medicine

## 2014-02-04 NOTE — Telephone Encounter (Signed)
Thanks

## 2014-02-04 NOTE — Telephone Encounter (Signed)
Called patient and let her know that this is the only GI office in [place] that takes [facility] [employer].  She is aware of this and will not be able to go since she doesn't have transportation. Jazmin Hartsell,CMA

## 2014-02-04 NOTE — Telephone Encounter (Signed)
Cancelled her appt with gastro in [place]. She needs an appt within the [facility].

## 2014-02-08 ENCOUNTER — Ambulatory Visit (INDEPENDENT_AMBULATORY_CARE_PROVIDER_SITE_OTHER): Payer: Self-pay | Admitting: Pharmacist

## 2014-02-08 ENCOUNTER — Ambulatory Visit (INDEPENDENT_AMBULATORY_CARE_PROVIDER_SITE_OTHER): Payer: Self-pay | Admitting: Family Medicine

## 2014-02-08 ENCOUNTER — Encounter: Payer: Self-pay | Admitting: Pharmacist

## 2014-02-08 ENCOUNTER — Telehealth: Payer: Self-pay | Admitting: *Deleted

## 2014-02-08 ENCOUNTER — Other Ambulatory Visit (HOSPITAL_COMMUNITY)
Admission: RE | Admit: 2014-02-08 | Discharge: 2014-02-08 | Disposition: A | Discharge status: Home / Self Care | Payer: Self-pay | Source: Ambulatory Visit | Attending: Family Medicine | Admitting: Family Medicine

## 2014-02-08 ENCOUNTER — Encounter: Payer: Self-pay | Admitting: Family Medicine

## 2014-02-08 VITALS — BP 175/95 | HR 76 | Ht 61.25 in | Wt 167.6 lb

## 2014-02-08 VITALS — BP 162/84 | HR 76 | Temp 98.4°F | Ht 61.0 in | Wt 167.8 lb

## 2014-02-08 DIAGNOSIS — Z01419 Encounter for gynecological examination (general) (routine) without abnormal findings: Secondary | ICD-10-CM | POA: Insufficient documentation

## 2014-02-08 DIAGNOSIS — I10 Essential (primary) hypertension: Secondary | ICD-10-CM

## 2014-02-08 DIAGNOSIS — F17200 Nicotine dependence, unspecified, uncomplicated: Secondary | ICD-10-CM

## 2014-02-08 DIAGNOSIS — Z124 Encounter for screening for malignant neoplasm of cervix: Secondary | ICD-10-CM

## 2014-02-08 DIAGNOSIS — Z720 Tobacco use: Secondary | ICD-10-CM

## 2014-02-08 DIAGNOSIS — J449 Chronic obstructive pulmonary disease, unspecified: Secondary | ICD-10-CM

## 2014-02-08 DIAGNOSIS — Z1151 Encounter for screening for human papillomavirus (HPV): Secondary | ICD-10-CM | POA: Insufficient documentation

## 2014-02-08 MED ORDER — METHYLPREDNISOLONE SODIUM SUCC 125 MG IJ SOLR
125.0000 mg | Freq: Once | INTRAMUSCULAR | Status: AC
  Administered 2014-02-08: 125 mg via INTRAMUSCULAR

## 2014-02-08 MED ORDER — IPRATROPIUM BROMIDE 0.02 % IN SOLN
0.5000 mg | Freq: Once | RESPIRATORY_TRACT | Status: AC
  Administered 2014-02-08: 0.5 mg via RESPIRATORY_TRACT

## 2014-02-08 MED ORDER — ALBUTEROL SULFATE (2.5 MG/3ML) 0.083% IN NEBU
2.5000 mg | INHALATION_SOLUTION | Freq: Once | RESPIRATORY_TRACT | Status: AC
  Administered 2014-02-08: 2.5 mg via RESPIRATORY_TRACT

## 2014-02-08 MED ORDER — MOMETASONE FURO-FORMOTEROL FUM 200-5 MCG/ACT IN AERO: 2.0000 | INHALATION_SPRAY | Freq: Two times a day (BID) | RESPIRATORY_TRACT | Status: DC

## 2014-02-08 MED ORDER — PREDNISONE 20 MG PO TABS
20.0000 mg | ORAL_TABLET | Freq: Every day | ORAL | Status: DC
Start: 2014-02-08 — End: 2014-02-26

## 2014-02-08 MED ORDER — DOXYCYCLINE HYCLATE 100 MG PO TABS: 100.0000 mg | ORAL_TABLET | Freq: Two times a day (BID) | ORAL | Status: DC

## 2014-02-08 MED ORDER — TIOTROPIUM BROMIDE MONOHYDRATE 18 MCG IN CAPS: 18.0000 ug | ORAL_CAPSULE | Freq: Every day | RESPIRATORY_TRACT | Status: DC

## 2014-02-08 NOTE — Assessment & Plan Note (Signed)
BP elevated due to non-adherence. Patient advised to pick up her medication at the pharmacy today. She agreed with plan.

## 2014-02-08 NOTE — Assessment & Plan Note (Signed)
PAP completed today. I will call her with result.

## 2014-02-08 NOTE — Patient Instructions (Addendum)
It was nice seeing you today. BP was high today again, please pick up your BP medication from the pharmacy today, it was sent over during your last visit. I am sorry yiou are still coughing. I will have you get PFT test done with Dr Valentina Lucks. We will try you on antibiotic and steroid to see if this will help.  If your symptoms persist see me back in 2 wks.

## 2014-02-08 NOTE — Patient Instructions (Addendum)
Stop taking the Qvar.  Starting taking the [facility] 2 puffs twice daily.   Pick up the Spiriva from the pharmacy but do not start taking it.  Take the albuterol as needed.  Continue to work on cutting back on smoking.  Come back and see [place] in two weeks and bring the Spiriva.

## 2014-02-08 NOTE — Telephone Encounter (Signed)
Received a message from [facility] stating they can fill pt's Rx since she is not a pt at [facility].  Derl Barrow, RN

## 2014-02-08 NOTE — Assessment & Plan Note (Signed)
Chronic tobacco abuse: patient in contemplative stage and is considering quitting in the future but is not ready to commit at this time. 40 year history of smoking in patient who has cut back cigarette intake over the last few years. Educated patient on smoking cessation and that is the most important thing that she can do for her health. Patient to try to cut back on cigarette intake throughout the day. Will re-evaluate at next visit.

## 2014-02-08 NOTE — Telephone Encounter (Signed)
Pt stated she does not have any money to pay out of pocket at this time. She will wait to hear back from [place] regarding the MAP.  Derl Barrow, RN

## 2014-02-08 NOTE — Progress Notes (Signed)
S:    Patient arrives in good spirits but with frequent coughing. Presents for lung function evaluation after seeing Dr. [name] this morning and was given an albuterol breathing treatment. She was also prescribed doxycycline and prednisone today.  Reports taking Qvar 2 puffs twice daily. She reports that she missed many days of Qvar due to lack of insurance over the last year. She reports taking Qvar more days than not in the last year. She was restarted on it on 01/18/14.  Patient reports shortness of breath and coughing for two years. It has impacted her ability to work and keep up with friends and family.   Patient reports that a pack of cigarettes will last her for 3 days. She reports that she only smokes half of the cigarette and then will come back and finish it about an hour later. She reports that she has significantly cut back over the last 2-3 years. She reports that her trigger to smoke is her nerves and stress. She denies waking up to smoke a cigarette. She smokes her first cigarette in about 30 minutes after waking up. She reports that she cannot smoke within the house (she lives in her sister's home) and must smoke outside or smokes in the bathroom. Patient motivated to cut back on smoking due to health but feels that it is unlikely that she will ever quit smoking completely.   She reports using the [facility] pharmacy program in which she can get her medications at no cost. She currently does not have any other insurance. Patient reports that she was just enrolled in this program and that she has not picked up any medications there, including her aspirin or chlorthalidone.   O:  CAT score= 33 See Documentation Flowsheet - CAT/COPD for complete symptom scoring.  See "scanned report" or Documentation Flowsheet (discrete results - PFTs) for  Spirometry results. Patient provided good effort while attempting spirometry.   Lung Age = 11  BP Readings from Last 3  Encounters:  02/08/14 175/95  02/08/14 162/84  01/18/14 159/86     A/P:  COPD: Today's spirometry evaluation reveals moderate restrictive lung disease likely due to coughing during testing and breathing treatment prior spirometry. Difficult to make assessment due to bronchodilation prior to testing. Previous PFTs in 2014 reveals severe obstructive lung disease. Today would classify patient as COPD Stage D based on CAT score of 33 and frequent COPD exacerbations (>2 in last year). Patient has been experiencing shortness of breath and coughing for 2 years and taking Qvar and albuterol. Will discontinue Qvar and will initiate  200/5 Dulera 2 puffs BID. Patient provided with 2 samples of Dulera. Will also order Spiriva 18 mcg, with patient to inhale the contents of one capsule daily but patient will not initiate until next visit in Pharmacy Clinic. Continue albuterol as needed. Educated patient on purpose, proper use, potential adverse effects including risk of esophageal candidiasis and need to rinse mouth after each use.  Reviewed results of pulmonary function tests.  Pt verbalized understanding of results and education.    Chronic tobacco abuse: patient in contemplative stage and is considering quitting in the future but is not ready to commit at this time. 40 year history of smoking in patient who has cut back cigarette intake over the last few years. Educated patient on smoking cessation and that is the most important thing that she can do for her health. Patient to try to cut back on cigarette intake throughout the day.  Will re-evaluate at next visit.   Hypertension: patient's blood pressure greatly elevated, likely due to coughing, current stressful time with illness and from noncompliance with her medication. Encouraged patient to pick up her prescription of chlorthalidone at the pharmacy. Will re-evaluate at next visit.   Written pt instructions provided.  F/U Clinic visit in 2 weeks: appointment  scheduled for February 19th at 8:30 am. Total time in face to face counseling 30 minutes.  Patient seen with Nicoletta Ba, Pharmacy Resident.

## 2014-02-08 NOTE — Telephone Encounter (Signed)
This was discussed with Pamala Hurry who will contact patient and help her establish with MAP program. Please contact patient and see if she is willing to pay for medication which can be cheaper if she gets them at [facility].

## 2014-02-08 NOTE — Assessment & Plan Note (Signed)
Not doing great on medication. Combivent treatment given today plus solumedrol IM. Wheezing improved after treatment. Plan to get PFT done at [facility] clinic. I sent her home on Doxy and oral steroid. RTC soon if no improvement.

## 2014-02-08 NOTE — Assessment & Plan Note (Signed)
COPD: Today's spirometry evaluation reveals moderate restrictive lung disease likely due to coughing during testing and breathing treatment prior spirometry. Difficult to make assessment due to bronchodilation prior to testing. Previous PFTs in 2014 reveals severe obstructive lung disease. Today would classify patient as COPD Stage D based on CAT score of 33 and frequent COPD exacerbations (>2 in last year). Patient has been experiencing shortness of breath and coughing for 2 years and taking Qvar and albuterol. Will discontinue Qvar and will initiate  200/5 Dulera 2 puffs BID. Patient provided with 2 samples of Dulera. Will also order Spiriva 18 mcg, with patient to inhale the contents of one capsule daily but patient will not initiate until next visit in Pharmacy Clinic. Continue albuterol as needed. Educated patient on purpose, proper use, potential adverse effects including risk of esophageal candidiasis and need to rinse mouth after each use.  Reviewed results of pulmonary function tests.  Pt verbalized understanding of results and education.    Chronic tobacco abuse: patient in contemplative stage and is considering quitting in the future but is not ready to commit at this time. 40 year history of smoking in patient who has cut back cigarette intake over the last few years. Educated patient on smoking cessation and that is the most important thing that she can do for her health. Patient to try to cut back on cigarette intake throughout the day. Will re-evaluate at next visit.

## 2014-02-08 NOTE — Assessment & Plan Note (Signed)
Hypertension: patient's blood pressure greatly elevated, likely due to coughing, current stressful time with illness and from noncompliance with her medication. Encouraged patient to pick up her prescription of chlorthalidone at the pharmacy. Will re-evaluate at next visit.

## 2014-02-08 NOTE — Progress Notes (Signed)
Subjective:     Patient ID: Betty Chapman, female   DOB: Jan 10, 1960, 54 y.o.   MRN: [id number]  HPI  COPD: Patient still not doing well on Albuterol prn and Qvar BID, still coughing, wheezing with chest tightness, she feesl her symptoms is worsening. Denies fever, no sick contact. She continues to smoke, she stated she is not ready to quit now due to stress. HTN: Here for follow up, she is yet to pick up her prescription from the pharmacy. PAP: Here for PAP.  Current Outpatient Prescriptions on File Prior to Visit  Medication Sig Dispense Refill  . albuterol (PROVENTIL) (2.5 MG/3ML) 0.083% nebulizer solution Take 2.5 mg by nebulization every 6 (six) hours as needed for wheezing or shortness of breath.    [name] albuterol (VENTOLIN HFA) 108 (90 BASE) MCG/ACT inhaler Inhale 2 puffs into the lungs every 6 (six) hours as needed for wheezing. Up to every 4-6 hours. 18 g 5  . aspirin 81 MG tablet Take 81 mg by mouth daily.    . chlorthalidone (HYGROTON) 25 MG tablet Take 1 tablet (25 mg total) by mouth daily. (Patient not taking: Reported on 02/08/2014) 90 tablet 1  . nystatin-triamcinolone (MYCOLOG II) cream Apply 1 application topically 2 (two) times daily. (Patient not taking: Reported on 02/08/2014) 30 g 1   No current facility-administered medications on file prior to visit.   Past Medical History  Diagnosis Date  . Tobacco abuse   . TIA (transient ischemic attack)       Review of Systems  Respiratory: Positive for cough, shortness of breath and wheezing.   Cardiovascular: Negative.   Gastrointestinal: Negative.   Genitourinary: Negative.   All other systems reviewed and are negative.  Filed Vitals:   02/08/14 0842 02/08/14 0935  BP: 162/84   Pulse: 76   Temp: 98.4 F (36.9 C)   TempSrc: Oral   Height: 5\' 1"  (1.549 m)   Weight: 167 lb 12.8 oz (76.114 kg)   SpO2:  99%       Objective:   Physical Exam  Constitutional: She is oriented to person, place, and time. She appears  well-developed. No distress.  Cardiovascular: Normal rate, regular rhythm, normal heart sounds and intact distal pulses.   No murmur heard. Pulmonary/Chest: Effort normal and breath sounds normal. No respiratory distress. She has no wheezes. She exhibits no tenderness.  Abdominal: Soft. Bowel sounds are normal. She exhibits no distension and no mass. There is no tenderness.  Musculoskeletal: Normal range of motion. She exhibits no edema.  Neurological: She is alert and oriented to person, place, and time. No cranial nerve deficit.  Nursing note and vitals reviewed.      Assessment:     COPD: HTN: PAP:    Plan:     Check problem list.

## 2014-02-11 ENCOUNTER — Ambulatory Visit: Payer: Self-pay | Admitting: Nurse Practitioner

## 2014-02-12 NOTE — Progress Notes (Signed)
Patient ID: Betty Chapman, female   DOB: [birth year], 54 y.o.   MRN: [id number] Reviewed: Agree with Dr. Graylin Shiver documentation and management

## 2014-02-13 ENCOUNTER — Other Ambulatory Visit: Payer: Self-pay

## 2014-02-13 DIAGNOSIS — Z1231 Encounter for screening mammogram for malignant neoplasm of breast: Secondary | ICD-10-CM

## 2014-02-13 LAB — CYTOLOGY - PAP

## 2014-02-14 ENCOUNTER — Telehealth: Payer: Self-pay | Admitting: Family Medicine

## 2014-02-14 ENCOUNTER — Ambulatory Visit: Payer: Self-pay

## 2014-02-14 NOTE — Telephone Encounter (Signed)
PAP result normal, discussed with patient.

## 2014-02-20 ENCOUNTER — Emergency Department (HOSPITAL_COMMUNITY)
Admission: EM | Admit: 2014-02-20 | Discharge: 2014-02-20 | Disposition: A | Discharge status: Home / Self Care | Payer: Self-pay | Attending: Emergency Medicine | Admitting: Emergency Medicine

## 2014-02-20 ENCOUNTER — Encounter (HOSPITAL_COMMUNITY): Payer: Self-pay | Admitting: Emergency Medicine

## 2014-02-20 DIAGNOSIS — Z9851 Tubal ligation status: Secondary | ICD-10-CM | POA: Insufficient documentation

## 2014-02-20 DIAGNOSIS — Z8673 Personal history of transient ischemic attack (TIA), and cerebral infarction without residual deficits: Secondary | ICD-10-CM | POA: Insufficient documentation

## 2014-02-20 DIAGNOSIS — I10 Essential (primary) hypertension: Secondary | ICD-10-CM | POA: Insufficient documentation

## 2014-02-20 DIAGNOSIS — Z720 Tobacco use: Secondary | ICD-10-CM | POA: Insufficient documentation

## 2014-02-20 DIAGNOSIS — Z79899 Other long term (current) drug therapy: Secondary | ICD-10-CM | POA: Insufficient documentation

## 2014-02-20 DIAGNOSIS — Z7951 Long term (current) use of inhaled steroids: Secondary | ICD-10-CM | POA: Insufficient documentation

## 2014-02-20 DIAGNOSIS — Z7982 Long term (current) use of aspirin: Secondary | ICD-10-CM | POA: Insufficient documentation

## 2014-02-20 HISTORY — DX: Essential (primary) hypertension: I10

## 2014-02-20 LAB — CBC WITH DIFFERENTIAL/PLATELET
Basophils Absolute: 0 10*3/uL (ref 0.0–0.1)
Basophils Relative: 0 % (ref 0–1)
Eosinophils Absolute: 0.1 10*3/uL (ref 0.0–0.7)
Eosinophils Relative: 2 % (ref 0–5)
HCT: 45.3 % (ref 36.0–46.0)
Hemoglobin: 15.3 g/dL — ABNORMAL HIGH (ref 12.0–15.0)
Lymphocytes Relative: 36 % (ref 12–46)
Lymphs Abs: 3.3 10*3/uL (ref 0.7–4.0)
MCH: 30.9 pg (ref 26.0–34.0)
MCHC: 33.8 g/dL (ref 30.0–36.0)
MCV: 91.5 fL (ref 78.0–100.0)
Monocytes Absolute: 0.5 10*3/uL (ref 0.1–1.0)
Monocytes Relative: 6 % (ref 3–12)
Neutro Abs: 5.3 10*3/uL (ref 1.7–7.7)
Neutrophils Relative %: 56 % (ref 43–77)
Platelets: 305 10*3/uL (ref 150–400)
RBC: 4.95 MIL/uL (ref 3.87–5.11)
RDW: 13.8 % (ref 11.5–15.5)
WBC: 9.3 10*3/uL (ref 4.0–10.5)

## 2014-02-20 LAB — COMPREHENSIVE METABOLIC PANEL
ALT: 18 U/L (ref 0–35)
AST: 20 U/L (ref 0–37)
Albumin: 3.7 g/dL (ref 3.5–5.2)
Alkaline Phosphatase: 113 U/L (ref 39–117)
Anion gap: 6 (ref 5–15)
BUN: 6 mg/dL (ref 6–23)
CO2: 29 mmol/L (ref 19–32)
Calcium: 9.3 mg/dL (ref 8.4–10.5)
Chloride: 107 mmol/L (ref 96–112)
Creatinine, Ser: 0.71 mg/dL (ref 0.50–1.10)
GFR calc Af Amer: >90 mL/min (ref >90)
GFR calc non Af Amer: >90 mL/min (ref >90)
Glucose, Bld: 142 mg/dL — ABNORMAL HIGH (ref 70–99)
Potassium: 4 mmol/L (ref 3.5–5.1)
Sodium: 142 mmol/L (ref 135–145)
Total Bilirubin: 0.5 mg/dL (ref 0.3–1.2)
Total Protein: 6.7 g/dL (ref 6.0–8.3)

## 2014-02-20 LAB — I-STAT TROPONIN, ED: Troponin i, poc: 0.02 ng/mL (ref 0.00–0.08)

## 2014-02-20 NOTE — ED Provider Notes (Signed)
CSN: [id number]     Arrival date & time 02/20/14  1847 History   First MD Initiated Contact with Patient 02/20/14 2140     Chief Complaint  Patient presents with  . Hypertension     (Consider location/radiation/quality/duration/timing/severity/associated sxs/prior Treatment) HPI Comments: Pt with hx of HTN, not taking any meds currently due to financial reasons comes in with cc of elevated BP. Pt checked her BP at home, unsure what her BP was at home, but it was elevated per her, thus she came to the ER. Pt has no headaches, chest pain, dib, visual complains. BP currently is 130s/70s.  Patient is a 54 y.o. female presenting with hypertension. The history is provided by the patient.  Hypertension Pertinent negatives include no chest pain and no shortness of breath.    Past Medical History  Diagnosis Date  . Tobacco abuse   . TIA (transient ischemic attack)   . Hypertension    Past Surgical History  Procedure Laterality Date  . Btl    . Cholecystectomy    . Tubal ligation     Family History  Problem Relation Age of Onset  . Aneurysm Mother     brain, cause of death   History  Substance Use Topics  . Smoking status: Current Every Day Smoker -- 0.50 packs/day    Types: Cigarettes  . Smokeless tobacco: Former Systems developer  . Alcohol Use: Yes     Comment: occasonally   OB History    No data available     Review of Systems  Eyes: Negative for visual disturbance.  Respiratory: Negative for shortness of breath.   Cardiovascular: Negative for chest pain.  Neurological: Negative for dizziness and light-headedness.      Allergies  Mushroom extract complex  Home Medications   Prior to Admission medications   Medication Sig Start Date End Date Taking? Authorizing Provider  albuterol (PROVENTIL) (2.5 MG/3ML) 0.083% nebulizer solution Take 2.5 mg by nebulization every 6 (six) hours as needed for wheezing or shortness of breath.    Historical Provider, MD  albuterol (VENTOLIN  HFA) 108 (90 BASE) MCG/ACT inhaler Inhale 2 puffs into the lungs every 6 (six) hours as needed for wheezing. Up to every 4-6 hours. 01/18/14   Andrena Mews, MD  aspirin 81 MG tablet Take 81 mg by mouth daily.    Historical Provider, MD  chlorthalidone (HYGROTON) 25 MG tablet Take 1 tablet (25 mg total) by mouth daily. Patient not taking: Reported on 02/08/2014 01/18/14   Andrena Mews, MD  doxycycline (VIBRA-TABS) 100 MG tablet Take 1 tablet (100 mg total) by mouth 2 (two) times daily. Patient not taking: Reported on 02/08/2014 02/08/14   Andrena Mews, MD  mometasone-formoterol (DULERA) 200-5 MCG/ACT AERO Inhale 2 puffs into the lungs 2 (two) times daily. 02/08/14   Zigmund Gottron, MD  mometasone-formoterol [facility]) 200-5 MCG/ACT AERO Inhale 2 puffs into the lungs 2 (two) times daily. 02/08/14   Zigmund Gottron, MD  nystatin-triamcinolone [facility] II) cream Apply 1 application topically 2 (two) times daily. Patient not taking: Reported on 02/08/2014 01/18/14   Andrena Mews, MD  predniSONE (DELTASONE) 20 MG tablet Take 1 tablet (20 mg total) by mouth daily with breakfast. Patient not taking: Reported on 02/08/2014 02/08/14   Andrena Mews, MD  tiotropium (SPIRIVA HANDIHALER) 18 MCG inhalation capsule Place 1 capsule (18 mcg total) into inhaler and inhale daily. 02/08/14   Zigmund Gottron, MD   BP 132/74 mmHg  Pulse 79  Temp(Src) 98.2 F (  36.8 C) (Oral)  Resp 18  SpO2 97%  LMP 12/27/2011 Physical Exam  Constitutional: She is oriented to person, place, and time. She appears well-developed.  Cardiovascular: Normal rate.   Pulmonary/Chest: Effort normal.  Neurological: She is alert and oriented to person, place, and time.  Nursing note and vitals reviewed.   ED Course  Procedures (including critical care time) Labs Review Labs Reviewed  CBC WITH DIFFERENTIAL/PLATELET - Abnormal; Notable for the following:    Hemoglobin 15.3 (*)    All other components within normal limits   COMPREHENSIVE METABOLIC PANEL - Abnormal; Notable for the following:    Glucose, Bld 142 (*)    All other components within normal limits  I-STAT TROPOININ, ED    Imaging Review No results found.   EKG Interpretation   Date/Time:  Wednesday February 20 2014 19:26:14 EST Ventricular Rate:  85 PR Interval:  120 QRS Duration: 78 QT Interval:  384 QTC Calculation: 456 R Axis:   76 Text Interpretation:  Normal sinus rhythm Normal ECG No significant change  since last tracing Confirmed by Kathrynn Humble, MD, Thelma Comp [phone]) on 02/20/2014  9:59:27 PM      MDM   Final diagnoses:  Benign essential HTN    Pt comes in with elevated BP. BP here is normal. PCP is attempting to help her get her BP meds. She has orange card. She is asymptomatic. Will d.c.  Varney Biles, MD 02/22/14 [phone]

## 2014-02-20 NOTE — ED Notes (Signed)
Pt. reports elevated blood pressure today at home ( can not remember exact reading ) with mild nausea , emesis x1 , brief  mid chest discomfort and right facial numbness.

## 2014-02-20 NOTE — Discharge Instructions (Signed)
We saw you in the ER for the elevated BP. All the results in the ER are normal, and the BP is normal. See your primary doctor for optimal bp management.   DASH Eating Plan DASH stands for "Dietary Approaches to Stop Hypertension." The DASH eating plan is a healthy eating plan that has been shown to reduce high blood pressure (hypertension). Additional health benefits may include reducing the risk of type 2 diabetes mellitus, heart disease, and stroke. The DASH eating plan may also help with weight loss. WHAT DO I NEED TO KNOW ABOUT THE DASH EATING PLAN? For the DASH eating plan, you will follow these general guidelines:  Choose foods with a percent daily value for sodium of less than 5% (as listed on the food label).  Use salt-free seasonings or herbs instead of table salt or sea salt.  Check with your health care provider or pharmacist before using salt substitutes.  Eat lower-sodium products, often labeled as "lower sodium" or "no salt added."  Eat fresh foods.  Eat more vegetables, fruits, and low-fat dairy products.  Choose whole grains. Look for the word "whole" as the first word in the ingredient list.  Choose fish and skinless chicken or [place] more often than red meat. Limit fish, poultry, and meat to 6 oz (170 g) each day.  Limit sweets, desserts, sugars, and sugary drinks.  Choose heart-healthy fats.  Limit cheese to 1 oz (28 g) per day.  Eat more home-cooked food and less restaurant, buffet, and fast food.  Limit fried foods.  Cook foods using methods other than frying.  Limit canned vegetables. If you do use them, rinse them well to decrease the sodium.  When eating at a restaurant, ask that your food be prepared with less salt, or no salt if possible. WHAT FOODS CAN I EAT? Seek help from a dietitian for individual calorie needs. Grains Whole grain or whole wheat bread. Brown rice. Whole grain or whole wheat pasta. Quinoa, bulgur, and whole grain cereals.  Low-sodium cereals. Corn or whole wheat flour tortillas. Whole grain cornbread. Whole grain crackers. Low-sodium crackers. Vegetables Fresh or frozen vegetables (raw, steamed, roasted, or grilled). Low-sodium or reduced-sodium tomato and vegetable juices. Low-sodium or reduced-sodium tomato sauce and paste. Low-sodium or reduced-sodium canned vegetables.  Fruits All fresh, canned (in natural juice), or frozen fruits. Meat and Other Protein Products Ground beef (85% or leaner), grass-fed beef, or beef trimmed of fat. Skinless chicken or [place]. Ground chicken or [place]. Pork trimmed of fat. All fish and seafood. Eggs. Dried beans, peas, or lentils. Unsalted nuts and seeds. Unsalted canned beans. Dairy Low-fat dairy products, such as skim or 1% milk, 2% or reduced-fat cheeses, low-fat ricotta or cottage cheese, or plain low-fat yogurt. Low-sodium or reduced-sodium cheeses. Fats and Oils Tub margarines without trans fats. Light or reduced-fat mayonnaise and salad dressings (reduced sodium). Avocado. Safflower, olive, or canola oils. Natural peanut or almond butter. Other Unsalted popcorn and pretzels. The items listed above may not be a complete list of recommended foods or beverages. Contact your dietitian for more options. WHAT FOODS ARE NOT RECOMMENDED? Grains White bread. White pasta. White rice. Refined cornbread. Bagels and croissants. Crackers that contain trans fat. Vegetables Creamed or fried vegetables. Vegetables in a cheese sauce. Regular canned vegetables. Regular canned tomato sauce and paste. Regular tomato and vegetable juices. Fruits Dried fruits. Canned fruit in light or heavy syrup. Fruit juice. Meat and Other Protein Products Fatty cuts of meat. Ribs, chicken wings, bacon, sausage, bologna,  salami, chitterlings, fatback, hot dogs, bratwurst, and packaged luncheon meats. Salted nuts and seeds. Canned beans with salt. Dairy Whole or 2% milk, cream, half-and-half, and cream  cheese. Whole-fat or sweetened yogurt. Full-fat cheeses or blue cheese. Nondairy creamers and whipped toppings. Processed cheese, cheese spreads, or cheese curds. Condiments Onion and garlic salt, seasoned salt, table salt, and sea salt. Canned and packaged gravies. Worcestershire sauce. Tartar sauce. Barbecue sauce. Teriyaki sauce. Soy sauce, including reduced sodium. Steak sauce. Fish sauce. Oyster sauce. Cocktail sauce. Horseradish. Ketchup and mustard. Meat flavorings and tenderizers. Bouillon cubes. Hot sauce. Tabasco sauce. Marinades. Taco seasonings. Relishes. Fats and Oils Butter, stick margarine, lard, shortening, ghee, and bacon fat. Coconut, palm kernel, or palm oils. Regular salad dressings. Other Pickles and olives. Salted popcorn and pretzels. The items listed above may not be a complete list of foods and beverages to avoid. Contact your dietitian for more information. WHERE CAN I FIND MORE INFORMATION? National Heart, Lung, and Blood Institute: travelstabloid.com Document Released: 12/10/2010 Document Revised: 05/07/2013 Document Reviewed: 10/25/2012 [facility] Patient Information 2015 [place], [place]. This information is not intended to replace advice given to you by your health care provider. Make sure you discuss any questions you have with your health care provider.  Hypertension Hypertension, commonly called high blood pressure, is when the force of blood pumping through your arteries is too strong. Your arteries are the blood vessels that carry blood from your heart throughout your body. A blood pressure reading consists of a higher number over a lower number, such as 110/72. The higher number (systolic) is the pressure inside your arteries when your heart pumps. The lower number (diastolic) is the pressure inside your arteries when your heart relaxes. Ideally you want your blood pressure below 120/80. Hypertension forces your heart to work harder to  pump blood. Your arteries may become narrow or stiff. Having hypertension puts you at risk for heart disease, stroke, and other problems.  RISK FACTORS Some risk factors for high blood pressure are controllable. Others are not.  Risk factors you cannot control include:   Race. You may be at higher risk if you are African American.  Age. Risk increases with age.  Gender. Men are at higher risk than women before age 66 years. After age 47, women are at higher risk than men. Risk factors you can control include:  Not getting enough exercise or physical activity.  Being overweight.  Getting too much fat, sugar, calories, or salt in your diet.  Drinking too much alcohol. SIGNS AND SYMPTOMS Hypertension does not usually cause signs or symptoms. Extremely high blood pressure (hypertensive crisis) may cause headache, anxiety, shortness of breath, and nosebleed. DIAGNOSIS  To check if you have hypertension, your health care provider will measure your blood pressure while you are seated, with your arm held at the level of your heart. It should be measured at least twice using the same arm. Certain conditions can cause a difference in blood pressure between your right and left arms. A blood pressure reading that is higher than normal on one occasion does not mean that you need treatment. If one blood pressure reading is high, ask your health care provider about having it checked again. TREATMENT  Treating high blood pressure includes making lifestyle changes and possibly taking medicine. Living a healthy lifestyle can help lower high blood pressure. You may need to change some of your habits. Lifestyle changes may include:  Following the DASH diet. This diet is high in fruits, vegetables, and  whole grains. It is low in salt, red meat, and added sugars.  Getting at least 2 hours of brisk physical activity every week.  Losing weight if necessary.  Not smoking.  Limiting alcoholic  beverages.  Learning ways to reduce stress. If lifestyle changes are not enough to get your blood pressure under control, your health care provider may prescribe medicine. You may need to take more than one. Work closely with your health care provider to understand the risks and benefits. HOME CARE INSTRUCTIONS  Have your blood pressure rechecked as directed by your health care provider.   Take medicines only as directed by your health care provider. Follow the directions carefully. Blood pressure medicines must be taken as prescribed. The medicine does not work as well when you skip doses. Skipping doses also puts you at risk for problems.   Do not smoke.   Monitor your blood pressure at home as directed by your health care provider. SEEK MEDICAL CARE IF:   You think you are having a reaction to medicines taken.  You have recurrent headaches or feel dizzy.  You have swelling in your ankles.  You have trouble with your vision. SEEK IMMEDIATE MEDICAL CARE IF:  You develop a severe headache or confusion.  You have unusual weakness, numbness, or feel faint.  You have severe chest or abdominal pain.  You vomit repeatedly.  You have trouble breathing. MAKE SURE YOU:   Understand these instructions.  Will watch your condition.  Will get help right away if you are not doing well or get worse. Document Released: 12/21/2004 Document Revised: 05/07/2013 Document Reviewed: 10/13/2012 [facility] Patient Information 2015 [place], [place]. This information is not intended to replace advice given to you by your health care provider. Make sure you discuss any questions you have with your health care provider.

## 2014-02-22 ENCOUNTER — Ambulatory Visit: Payer: Self-pay | Admitting: Pharmacist

## 2014-02-25 ENCOUNTER — Ambulatory Visit: Payer: Self-pay

## 2014-02-26 ENCOUNTER — Encounter: Payer: Self-pay | Admitting: Pharmacist

## 2014-02-26 ENCOUNTER — Ambulatory Visit (INDEPENDENT_AMBULATORY_CARE_PROVIDER_SITE_OTHER): Payer: Self-pay | Admitting: Pharmacist

## 2014-02-26 VITALS — BP 158/87 | HR 87 | Ht 60.0 in | Wt 167.0 lb

## 2014-02-26 DIAGNOSIS — I10 Essential (primary) hypertension: Secondary | ICD-10-CM

## 2014-02-26 DIAGNOSIS — J449 Chronic obstructive pulmonary disease, unspecified: Secondary | ICD-10-CM

## 2014-02-26 DIAGNOSIS — Z720 Tobacco use: Secondary | ICD-10-CM

## 2014-02-26 DIAGNOSIS — F17200 Nicotine dependence, unspecified, uncomplicated: Secondary | ICD-10-CM

## 2014-02-26 MED ORDER — CHLORTHALIDONE 25 MG PO TABS: 25.0000 mg | ORAL_TABLET | Freq: Every day | ORAL | Status: DC

## 2014-02-26 MED ORDER — DOXYCYCLINE HYCLATE 100 MG PO TABS: 100.0000 mg | ORAL_TABLET | Freq: Two times a day (BID) | ORAL | Status: DC

## 2014-02-26 MED ORDER — PREDNISONE 20 MG PO TABS: 20.0000 mg | ORAL_TABLET | Freq: Every day | ORAL | Status: DC

## 2014-02-26 MED ORDER — TIOTROPIUM BROMIDE MONOHYDRATE 18 MCG IN CAPS: 18.0000 ug | ORAL_CAPSULE | Freq: Every day | RESPIRATORY_TRACT | Status: DC

## 2014-02-26 NOTE — Progress Notes (Signed)
S:  Patient arrives alone and unassisted for evaluation/assistance with tobacco dependence. She states that she has been more stressed recently and has been smoking 1 pack every 3 days. She is not interested in quitting smoking at this time.   Pt was prescribed doxycycline and prednisone close to 2 weeks ago for a COPD exacerbation. However, she states that she was never able to pick these up from [facility].  She has also been unable to pick up her Spiriva or chlorthalidone despite having an orange card.  Currently using albuterol prn (once in past 2 weeks) and Dulera 2 puffs BID. She reports no improvement in symptoms and still has a very productive cough.  Age when started using tobacco on a daily basis: 37 + years since age 54 Number of Cigarettes per day: used to smoke 2 packs a day, down to 1 pack every 3 days now. Brand smoked: "full flavor anything."  Smokes first cigarette with morning coffee--about an hour after waking. Denies waking to smoke   Most recent quit attempt: 2 years ago, tried but not successful.     A/P: Chronic lung disease with continued exacerbation symptoms who was prescribed doxycycline and prednisone ~ 2 weeks ago however was unable to pick these medications up from [facility] despite having an orange card.  She continues to smoke 1 pack every 3 days. She was recently switched from [facility] to [facility]. Despite adherence with Dulera and prn albuterol (has used once in past 2 weeks), patient reports that symptoms have not improved at all.  After discussion and brief evaluation with Dr. Erin Hearing we will resend prednisone and doxycycline to [facility]. Patient instructed to fill out paperwork at [facility] for Spiriva. Patient encouraged to drink more water and instructed on use of OTC guaifenecin for her congested and productive cough.  Chronic tobacco abuse for past 40 years: patient still in contemplative stage. Is not ready to commit to quitting smoking at this time, although she feels that she may be  able to cut back on her smoking. Educated that quitting smoking is one of the most important things she can do for her health. Will re-evaluate at next visit.  BP is elevated at today's visit, however patient has not been able to pick up her chlorthalidone from [facility]. Also with a frequent productive cough, lung disease exacerbation, and stress that may be contributing to elevated BP. No medication changes made at this time, will follow up next visit.  Written patient instructions provided. Will follow up with pharmacy clinic once she has picked up her Spiriva. Total time in face-to-face counseling 30 minutes. Patient seen with Fuller Canada, PharmD resident.

## 2014-02-26 NOTE — Assessment & Plan Note (Signed)
Chronic tobacco abuse for past 40 years: patient still in contemplative stage. Is not ready to commit to quitting smoking at this time, although she feels that she may be able to cut back on her smoking. Educated that quitting smoking is one of the most important things she can do for her health. Will re-evaluate at next visit.

## 2014-02-26 NOTE — Patient Instructions (Signed)
It was nice to see you in clinic today  - We will resend your medications to [facility] to help with your breathing (prednisone, doxycycline, Spiriva) - Go to [facility] to fill out paperwork for Spiriva - Pick up guaifenecin (Robitussin) to help with your cough - Call to schedule an appointment with pharmacy once you pick up your Spiriva so that we can teach you how to use it

## 2014-02-26 NOTE — Assessment & Plan Note (Signed)
Chronic lung disease with continued exacerbation symptoms who was prescribed doxycycline and prednisone ~ 2 weeks ago however was unable to pick these medications up from [facility] despite having an orange card.  She continues to smoke 1 pack every 3 days. She was recently switched from Qvar to [facility]. Despite adherence with Dulera and prn albuterol (has used once in past 2 weeks), patient reports that symptoms have not improved at all.  After discussion and brief evaluation with Dr. Erin Hearing we will resend prednisone and doxycycline to [facility]. Patient instructed to fill out paperwork at [facility] for Spiriva. Patient encouraged to drink more water and instructed on use of OTC guaifenecin for her congested and productive cough.  Chronic tobacco abuse for past 40 years: patient still in contemplative stage. Is not ready to commit to quitting smoking at this time, although she feels that she may be able to cut back on her smoking. Educated that quitting smoking is one of the most important things she can do for her health. Will re-evaluate at next visit.

## 2014-02-26 NOTE — Assessment & Plan Note (Signed)
BP is elevated at today's visit, however patient has not been able to pick up her chlorthalidone from [facility]. Also with a frequent productive cough, lung disease exacerbation, and stress that may be contributing to elevated BP. No medication changes made at this time, will follow up next visit.

## 2014-02-27 ENCOUNTER — Other Ambulatory Visit: Payer: Self-pay | Admitting: Family Medicine

## 2014-02-27 ENCOUNTER — Telehealth: Payer: Self-pay | Admitting: *Deleted

## 2014-02-27 MED ORDER — HYDROCHLOROTHIAZIDE 25 MG PO TABS: 25.0000 mg | ORAL_TABLET | Freq: Every day | ORAL | Status: DC

## 2014-02-27 NOTE — Progress Notes (Addendum)
I appreciate care given to patient by Dr Valentina Lucks.

## 2014-02-27 NOTE — Telephone Encounter (Signed)
Kathy from [facility] called stating they do not carry chlorthalidone (HYGROTON) 25 MG tablet.  Can the medication be changed to HCTZ; same dosage.  If changing please fax a new Rx.  Call if questions at [phone]. Derl Barrow, RN

## 2014-02-27 NOTE — Progress Notes (Signed)
Patient ID: Betty Chapman, female   DOB: Aug 04, 1960, 54 y.o.   MRN: [id number] Reviewed: Agree with Dr. Graylin Shiver documentation and management.

## 2014-02-27 NOTE — Telephone Encounter (Signed)
I called Pharm and agreed with change, I will also change medication on Epic. Thanks.

## 2014-03-19 ENCOUNTER — Encounter: Payer: Self-pay | Admitting: Family Medicine

## 2014-03-19 ENCOUNTER — Ambulatory Visit (INDEPENDENT_AMBULATORY_CARE_PROVIDER_SITE_OTHER): Payer: Self-pay | Admitting: Family Medicine

## 2014-03-19 VITALS — BP 142/89 | HR 78 | Temp 98.3°F | Ht 60.0 in | Wt 166.0 lb

## 2014-03-19 DIAGNOSIS — R1310 Dysphagia, unspecified: Secondary | ICD-10-CM

## 2014-03-19 DIAGNOSIS — I10 Essential (primary) hypertension: Secondary | ICD-10-CM

## 2014-03-19 DIAGNOSIS — H538 Other visual disturbances: Secondary | ICD-10-CM

## 2014-03-19 DIAGNOSIS — J449 Chronic obstructive pulmonary disease, unspecified: Secondary | ICD-10-CM

## 2014-03-19 DIAGNOSIS — R0600 Dyspnea, unspecified: Secondary | ICD-10-CM

## 2014-03-19 DIAGNOSIS — Z0000 Encounter for general adult medical examination without abnormal findings: Secondary | ICD-10-CM

## 2014-03-19 NOTE — Assessment & Plan Note (Signed)
Compliant with HCTZ, doing well. Will continue the same dose.

## 2014-03-19 NOTE — Patient Instructions (Signed)
Shortness of Breath °Shortness of breath means you have trouble breathing. Shortness of breath needs medical care right away. °HOME CARE  °· Do not smoke. °· Avoid being around chemicals or things (paint fumes, dust) that may bother your breathing. °· Rest as needed. Slowly begin your normal activities. °· Only take medicines as told by your doctor. °· Keep all doctor visits as told. °GET HELP RIGHT AWAY IF:  °· Your shortness of breath gets worse. °· You feel lightheaded, pass out (faint), or have a cough that is not helped by medicine. °· You cough up blood. °· You have pain with breathing. °· You have pain in your chest, arms, shoulders, or belly (abdomen). °· You have a fever. °· You cannot walk up stairs or exercise the way you normally do. °· You do not get better in the time expected. °· You have a hard time doing normal activities even with rest. °· You have problems with your medicines. °· You have any new symptoms. °MAKE SURE YOU: °· Understand these instructions. °· Will watch your condition. °· Will get help right away if you are not doing well or get worse. °Document Released: 06/09/2007 Document Revised: 12/26/2012 Document Reviewed: 03/08/2011 °ExitCare® Patient Information ©2015 [employer]. This information is not intended to replace advice given to you by your health care provider. Make sure you discuss any questions you have with your health care provider. ° °

## 2014-03-19 NOTE — Progress Notes (Signed)
Subjective:     Patient ID: Betty Chapman, female   DOB: 11/10/1960, 54 y.o.   MRN: [id number]  HPI OIB:[id number] has been compliant with her HCTZ, she did not take it yet today, her BP checked at home goes up and down. Denies any new concern. She is yet to take her medicine prior to coming to the clinic. COPD/Dypnea:Improves only a little, she is still coughing, no chest tightness, at times still having SOB especially with activity. She denies leg swelling, no diaphoresis, she wheezes on and off which improves with albuterol. She is yet to get her Spiriva from the pharmacy but she is compliant with her Dulera. Vision Blurry:C/O b/l blurry vision on and off, she noticed this few weeks ago whenever she is watching TV, no pain, one time she had redness of her eyes, no discharge,no itching, she uses eye glasses for distance.  VQ:XIHWT yet to get her mammogram and colonoscopy. Denies any major GI or breast concern.  Current Outpatient Prescriptions on File Prior to Visit  Medication Sig Dispense Refill  . albuterol (PROVENTIL) (2.5 MG/3ML) 0.083% nebulizer solution Take 2.5 mg by nebulization every 6 (six) hours as needed for wheezing or shortness of breath.    [name] albuterol (VENTOLIN HFA) 108 (90 BASE) MCG/ACT inhaler Inhale 2 puffs into the lungs every 6 (six) hours as needed for wheezing. Up to every 4-6 hours. 18 g 5  . hydrochlorothiazide (HYDRODIURIL) 25 MG tablet Take 1 tablet (25 mg total) by mouth daily. 90 tablet 1  . mometasone-formoterol (DULERA) 200-5 MCG/ACT AERO Inhale 2 puffs into the lungs 2 (two) times daily. 8.8 g 0  . aspirin 81 MG tablet Take 81 mg by mouth daily.    [name] nystatin-triamcinolone (MYCOLOG II) cream Apply 1 application topically 2 (two) times daily. (Patient not taking: Reported on 02/08/2014) 30 g 1  . tiotropium (SPIRIVA HANDIHALER) 18 MCG inhalation capsule Place 1 capsule (18 mcg total) into inhaler and inhale daily. (Patient not taking: Reported on 03/19/2014) 30  capsule 12   No current facility-administered medications on file prior to visit.   Past Medical History  Diagnosis Date  . Tobacco abuse   . TIA (transient ischemic attack)   . Hypertension      Review of Systems  Eyes: Positive for visual disturbance. Negative for pain, discharge, redness and itching.  Respiratory: Positive for cough, shortness of breath and wheezing.   Cardiovascular: Negative for chest pain, palpitations and leg swelling.  Gastrointestinal: Negative.   Neurological: Negative.  Negative for headaches.  All other systems reviewed and are negative.  Filed Vitals:   03/19/14 0839  BP: 142/89  Pulse: 78  Temp: 98.3 F (36.8 C)  TempSrc: Oral  Height: 5' (1.524 m)  Weight: 166 lb (75.297 kg)       Objective:   Physical Exam  Constitutional: She is oriented to person, place, and time. She appears well-developed. No distress.  Eyes: Conjunctivae, EOM and lids are normal. Lids are everted and swept, no foreign bodies found. Right eye exhibits no discharge. Left eye exhibits no discharge. No scleral icterus.  Fundoscopic exam:      The right eye shows red reflex.       The left eye shows red reflex.  Cardiovascular: Normal rate, regular rhythm, normal heart sounds and intact distal pulses.   No murmur heard. Pulmonary/Chest: Effort normal and breath sounds normal. No respiratory distress. She has no wheezes. She has no rales.  Abdominal: Soft. Bowel sounds are  normal. She exhibits no distension and no mass. There is no tenderness.  Musculoskeletal: Normal range of motion. She exhibits no edema.  Neurological: She is alert and oriented to person, place, and time. No cranial nerve deficit.  Nursing note and vitals reviewed.      Assessment:     HTN COPD Dypsnea Blurry vision Health maintenance.     Plan:     Check problem list

## 2014-03-20 DIAGNOSIS — R0600 Dyspnea, unspecified: Secondary | ICD-10-CM | POA: Insufficient documentation

## 2014-03-20 DIAGNOSIS — H538 Other visual disturbances: Secondary | ICD-10-CM | POA: Insufficient documentation

## 2014-03-20 HISTORY — DX: Other visual disturbances: H53.8

## 2014-03-20 LAB — HIV ANTIBODY (ROUTINE TESTING W REFLEX): HIV 1&2 Ab, 4th Generation: NONREACTIVE

## 2014-03-20 NOTE — Assessment & Plan Note (Signed)
Likely related to her COPD. R/O CHF. ECHO ordered.

## 2014-03-20 NOTE — Assessment & Plan Note (Addendum)
I again gave instruction to schedule mammogram. I referred her again to GI for colonoscopy and EGD for her dysphagia. HIV test done today.

## 2014-03-20 NOTE — Assessment & Plan Note (Addendum)
Etiology unclear. Vision screening done during this visit looks good. I referred her to ophthalmologist for further assessment. Return precaution discussed.

## 2014-03-20 NOTE — Assessment & Plan Note (Signed)
Currently stable on Dulera and Albuterol prn. As discussed with her she will feel much better with Spiriva addition. She agreed to pick up her medication at [facility] sometimes this week. Continue counseling for smoking cessation. Patient not currently ready to quit.

## 2014-03-25 ENCOUNTER — Encounter: Payer: Self-pay | Admitting: Internal Medicine

## 2014-04-05 ENCOUNTER — Other Ambulatory Visit: Payer: Self-pay | Admitting: Family Medicine

## 2014-04-05 DIAGNOSIS — Z131 Encounter for screening for diabetes mellitus: Secondary | ICD-10-CM

## 2014-04-05 DIAGNOSIS — I10 Essential (primary) hypertension: Secondary | ICD-10-CM

## 2014-04-05 NOTE — Progress Notes (Signed)
Please have patient come in for lab on Monday  04/08/14

## 2014-04-05 NOTE — Progress Notes (Signed)
Is there a reason for me to tell patient as to why she is getting these labs drawn?  Cale Decarolis,CMA

## 2014-04-05 NOTE — Progress Notes (Signed)
I already discussed the reason with her, she is aware she need test for health maintaince all she need is lab appointment.  Sorry I didn't clarify this initially.

## 2014-04-08 ENCOUNTER — Telehealth: Payer: Self-pay | Admitting: Family Medicine

## 2014-04-08 ENCOUNTER — Other Ambulatory Visit (INDEPENDENT_AMBULATORY_CARE_PROVIDER_SITE_OTHER): Payer: Self-pay

## 2014-04-08 ENCOUNTER — Ambulatory Visit (INDEPENDENT_AMBULATORY_CARE_PROVIDER_SITE_OTHER): Payer: Self-pay | Admitting: Obstetrics and Gynecology

## 2014-04-08 ENCOUNTER — Encounter: Payer: Self-pay | Admitting: Obstetrics and Gynecology

## 2014-04-08 VITALS — BP 143/76 | HR 86 | Temp 97.9°F | Ht 60.0 in | Wt 165.0 lb

## 2014-04-08 DIAGNOSIS — J209 Acute bronchitis, unspecified: Secondary | ICD-10-CM

## 2014-04-08 DIAGNOSIS — R05 Cough: Secondary | ICD-10-CM

## 2014-04-08 DIAGNOSIS — R059 Cough, unspecified: Secondary | ICD-10-CM

## 2014-04-08 DIAGNOSIS — Z131 Encounter for screening for diabetes mellitus: Secondary | ICD-10-CM

## 2014-04-08 DIAGNOSIS — E669 Obesity, unspecified: Secondary | ICD-10-CM

## 2014-04-08 DIAGNOSIS — I10 Essential (primary) hypertension: Secondary | ICD-10-CM

## 2014-04-08 LAB — LIPID PANEL
Cholesterol: 203 mg/dL — ABNORMAL HIGH (ref 0–200)
HDL: 42 mg/dL — ABNORMAL LOW (ref >=46)
LDL Cholesterol: 135 mg/dL — ABNORMAL HIGH (ref 0–99)
Total CHOL/HDL Ratio: 4.8 Ratio
Triglycerides: 132 mg/dL (ref <150)
VLDL: 26 mg/dL (ref 0–40)

## 2014-04-08 LAB — GLUCOSE, CAPILLARY: Glucose-Capillary: 102 mg/dL — ABNORMAL HIGH (ref 70–99)

## 2014-04-08 LAB — POCT GLYCOSYLATED HEMOGLOBIN (HGB A1C): Hemoglobin A1C: 6.6

## 2014-04-08 MED ORDER — CYCLOBENZAPRINE HCL 5 MG PO TABS: 5.0000 mg | ORAL_TABLET | Freq: Three times a day (TID) | ORAL | Status: DC | PRN

## 2014-04-08 MED ORDER — DM-GUAIFENESIN ER 30-600 MG PO TB12: 1.0000 | ORAL_TABLET | Freq: Two times a day (BID) | ORAL | Status: DC | PRN

## 2014-04-08 NOTE — Progress Notes (Signed)
LM for patient to call office and schedule a lab only appt. Marlyn Rabine,CMA

## 2014-04-08 NOTE — Progress Notes (Signed)
Subjective:     Patient ID: Betty Chapman, female   DOB: 03/02/1960, 54 y.o.   MRN: [id number]  Cough This is a chronic (However, patient with acute worsening of coughs symptoms.) problem. The current episode started in the past 7 days. The problem has been rapidly worsening. The problem occurs every few minutes (Persistent). The cough is productive of sputum (clear thick mucous). Associated symptoms include a sore throat, shortness of breath and wheezing. Pertinent negatives include no chest pain, fever, headaches or nasal congestion. Risk factors for lung disease include smoking/tobacco exposure. She has tried nothing for the symptoms. Her past medical history is significant for COPD. Current every day smoker.    Review of Systems  Constitutional: Negative for fever.  HENT: Positive for sore throat.   Respiratory: Positive for cough, shortness of breath and wheezing.   Cardiovascular: Negative for chest pain.  Neurological: Negative for headaches.      Objective:   Physical Exam  Constitutional: She appears well-developed and well-nourished.  Cardiovascular: Normal rate, regular rhythm and normal heart sounds.   Pulmonary/Chest: Effort normal and breath sounds normal. No respiratory distress.  Cough present. Scattered wheezes and rhonchi. Poor air movement.        Assessment:     Betty Chapman is a 54 y.o. female who presented with acute worsening of cough. Presumptive etiology is acute bronchitis in setting of continued tobacco use.     Plan:     Bronchitis: -self-limiting condition but with hx of COPD have to monitor for COPD exacerbation -patient instructed to take all inhaler medications  - Rx for limited amount of cough suppressant and muscle relaxant -RTC as needed if symptoms worsen or not improved in about a week -no need for imaging or abx treatment at this time -conservative management

## 2014-04-08 NOTE — Telephone Encounter (Signed)
I spoke with patient and discussed A1C result with her.  A1C 6.6 but FBG of 102. I recommended diet and exercise for weight loss. Recheck A1C in 3 month, if no improvement will start her on Metformin. She agreed with plan. She does not want to be placed on DM medication and agreed to give it all it takes to prevent her from being placed on DM medication. I will continue to work with her.

## 2014-04-08 NOTE — Telephone Encounter (Signed)
Message left to call back about test result.   Result: A1C 6.6 and FBG 102.  Here FBG is within prediabetic range although her A1C is indicative of diabetes. She might benefit from diet and exercise modification prior to starting medication. I will discuss this with her during next visit. FLP result is pending as of now.

## 2014-04-08 NOTE — Progress Notes (Signed)
[facility] phlebotomist drew:  Fasting Lipid, A1c, fasting glucose

## 2014-04-08 NOTE — Patient Instructions (Addendum)
Please return to clinic if your symptoms are worsening or not improving in about a week. It is important that you continue to take your COPD medications.    Acute Bronchitis Bronchitis is when the airways that extend from the windpipe into the lungs get red, puffy, and painful (inflamed). Bronchitis often causes thick spit (mucus) to develop. This leads to a cough. A cough is the most common symptom of bronchitis. In acute bronchitis, the condition usually begins suddenly and goes away over time (usually in 2 weeks). Smoking, allergies, and asthma can make bronchitis worse. Repeated episodes of bronchitis may cause more lung problems. HOME CARE  Rest.  Drink enough fluids to keep your pee (urine) clear or pale yellow (unless you need to limit fluids as told by your doctor).  Only take over-the-counter or prescription medicines as told by your doctor.  Avoid smoking and secondhand smoke. These can make bronchitis worse. If you are a smoker, think about using nicotine gum or skin patches. Quitting smoking will help your lungs heal faster.  Reduce the chance of getting bronchitis again by:  Washing your hands often.  Avoiding people with cold symptoms.  Trying not to touch your hands to your mouth, nose, or eyes.  Follow up with your doctor as told. GET HELP IF: Your symptoms do not improve after 1 week of treatment. Symptoms include:  Cough.  Fever.  Coughing up thick spit.  Body aches.  Chest congestion.  Chills.  Shortness of breath.  Sore throat. GET HELP RIGHT AWAY IF:   You have an increased fever.  You have chills.  You have severe shortness of breath.  You have bloody thick spit (sputum).  You throw up (vomit) often.  You lose too much body fluid (dehydration).  You have a severe headache.  You faint. MAKE SURE YOU:   Understand these instructions.  Will watch your condition.  Will get help right away if you are not doing well or get  worse. Document Released: 06/09/2007 Document Revised: 08/23/2012 Document Reviewed: 06/13/2012 [facility] Patient Information 2015 [place], [place]. This information is not intended to replace advice given to you by your health care provider. Make sure you discuss any questions you have with your health care provider.

## 2014-04-09 NOTE — Progress Notes (Signed)
Discussed with resident.

## 2014-04-10 ENCOUNTER — Telehealth: Payer: Self-pay | Admitting: Family Medicine

## 2014-04-10 ENCOUNTER — Telehealth: Payer: Self-pay | Admitting: *Deleted

## 2014-04-10 ENCOUNTER — Ambulatory Visit: Payer: Self-pay | Admitting: Nurse Practitioner

## 2014-04-10 MED ORDER — LOVASTATIN 40 MG PO TABS: 40.0000 mg | ORAL_TABLET | Freq: Every day | ORAL | Status: DC

## 2014-04-10 NOTE — Telephone Encounter (Signed)
[facility] a [facility] stating they don't have lovastatin.  They only have simvastatin, provastatin and gemfibrozil.  Please advise.  Derl Barrow, RN

## 2014-04-10 NOTE — Telephone Encounter (Signed)
I discussed FLP result with her. Since she is a smoker and on treatment for HTN and possibly pre-diabetic I recommended moderate intensity statin for her. She agreed with plan.

## 2014-04-11 ENCOUNTER — Other Ambulatory Visit: Payer: Self-pay | Admitting: Family Medicine

## 2014-04-11 MED ORDER — PRAVASTATIN SODIUM 40 MG PO TABS: 40.0000 mg | ORAL_TABLET | Freq: Every day | ORAL | Status: DC

## 2014-04-11 NOTE — Telephone Encounter (Signed)
I will change to pravastatin.

## 2014-04-15 ENCOUNTER — Ambulatory Visit (INDEPENDENT_AMBULATORY_CARE_PROVIDER_SITE_OTHER): Payer: Self-pay | Admitting: Pharmacist

## 2014-04-15 ENCOUNTER — Encounter: Payer: Self-pay | Admitting: Pharmacist

## 2014-04-15 VITALS — BP 127/84 | HR 89 | Temp 98.3°F | Ht 61.42 in | Wt 160.1 lb

## 2014-04-15 DIAGNOSIS — J449 Chronic obstructive pulmonary disease, unspecified: Secondary | ICD-10-CM

## 2014-04-15 DIAGNOSIS — Z720 Tobacco use: Secondary | ICD-10-CM

## 2014-04-15 DIAGNOSIS — F17200 Nicotine dependence, unspecified, uncomplicated: Secondary | ICD-10-CM

## 2014-04-15 NOTE — Assessment & Plan Note (Signed)
Severe Nicotine Dependence of 40+ years duration in a patient who is poor candidate for success b/c of stress.  Patient is very emotional and seems disappointed in herself now that she has increased her smoking intake again.  She is aware of risks of continuing and is aware that she needs to be ready to quit.  She was encouraged to quit and will follow up at next visit.

## 2014-04-15 NOTE — Assessment & Plan Note (Signed)
Severe Nicotine Dependence of 40+ years duration in a patient who is poor candidate for success b/c of stress.  Patient is very emotional and seems disappointed in herself now that she has increased her smoking intake again.  She is aware of risks of continuing and is aware that she needs to be ready to quit.  She was encouraged to quit and will follow up at next visit.  Severe COPD stage D who is consistently utilizing rescue inhaler.   Plan to continue current inhalers and initiate Spiriva 23mcg- 1 puff once daily which she brought from the [place].   Patient was counseled on proper inhalation technique and was able to successfully administer 1st dose in office today.   Was also instructed to drink more water and use saline nasal spray as needed.

## 2014-04-15 NOTE — Patient Instructions (Addendum)
It was great to see you today!  Continue to take all of your medications and inhalers.  For your allergies: try to drink more water and use Normal Saline Nasal Spray as needed for congestion.

## 2014-04-15 NOTE — Progress Notes (Signed)
S:  Patient arrives in [facility] today ambulating by self unaccompanied.   Patient arrives for evaluation/assistance with tobacco dependence & medication evaluation.   Age when started using tobacco on a daily basis: since age 54. Number of Cigarettes per day: used to smoke 2 ppd, then down to 1 pack every 3 days, now increased to 1 pack every 2 days. Brand smoked "full flavor anything".  Smokes first cigarette with morning coffee- about an hour after waking. Denies waking to smoke. Most recent quit attempt: 2 years ago, tried by not successful  Triggers to use tobacco include: stress  Patient has severe/very severe obstructive lung disease corresponding to GOLD classification D based on spirometry (last done 02/08/2012) and multiple exacerbations in the past. Patient is complaining of more SOB from allergies and head congestion that is requiring her to use her albuterol rescue inhaler ~ 4 times daily plus Dulera 200/5 2 puffs BID.   She has NOT yet started the Spiriva handihaler.   Patient's blood glucose has been 142 on 02/2014 and she is feeling nervous about being diagnosed with diabetes.   A/P: Severe Nicotine Dependence of 40+ years duration in a patient who is poor candidate for success b/c of stress.  Patient is very emotional and seems disappointed in herself now that she has increased her smoking intake again.  She is aware of risks of continuing and is aware that she needs to be ready to quit.  She was encouraged to quit and will follow up at next visit.  Severe COPD stage D who is consistently utilizing rescue inhaler.   Plan to continue current inhalers and initiate Spiriva 22mcg- 1 puff once daily which she brought from the [facility].   Patient was counseled on proper inhalation technique and was able to successfully administer 1st dose in office today.   Was also instructed to drink more water and use saline nasal spray as needed.    Patient was reassured she is doing  everything she can to improve her health. Glucose could be high due to multiple reasons unknown. But she has better diet control and has started walking more now.   Written information provided. F/U Rx Clinic Visit is up to patient if she needs it.  Total time in face-to-face counseling 25 minutes.  Patient seen with Eunice Blase, Pharm.D. Candidate.

## 2014-04-17 ENCOUNTER — Ambulatory Visit: Payer: Self-pay | Admitting: Family Medicine

## 2014-04-17 ENCOUNTER — Telehealth: Payer: Self-pay | Admitting: *Deleted

## 2014-04-17 NOTE — Telephone Encounter (Addendum)
Pt called stating she had an allergic reaction to pravastatin. She took first dose yesterday and started with hives and itching.  Pt denies any difficult breathing other than normal issues with COPD and fever.  Pt did not take any more of the medication.  Precept with Dr. [name]; have patient in for nurse visit today because she was precepting. Pt could not come in today, but stated tomorrow will be better.  Dr. [name] stated that is ok as long as she does not have any other problems/symptoms.  If symptoms worsen pt to go to ED.  Pt to continue to hold pravastatin.  Appt 04/18/14 8:30 AM with Dr. Raoul Pitch.  Pt stated understanding.  Derl Barrow, RN

## 2014-04-17 NOTE — Progress Notes (Signed)
Patient ID: Betty Chapman, female   DOB: 01-01-1961, 54 y.o.   MRN: [id number] Reviewed: Agree with Dr. Graylin Shiver documentation and management.

## 2014-04-18 ENCOUNTER — Ambulatory Visit (INDEPENDENT_AMBULATORY_CARE_PROVIDER_SITE_OTHER): Payer: Self-pay | Admitting: Family Medicine

## 2014-04-18 ENCOUNTER — Other Ambulatory Visit: Payer: Self-pay | Admitting: Family Medicine

## 2014-04-18 ENCOUNTER — Encounter: Payer: Self-pay | Admitting: Family Medicine

## 2014-04-18 VITALS — BP 129/68 | HR 80 | Temp 98.1°F | Ht 61.0 in | Wt 161.8 lb

## 2014-04-18 DIAGNOSIS — L509 Urticaria, unspecified: Secondary | ICD-10-CM

## 2014-04-18 MED ORDER — ATORVASTATIN CALCIUM 40 MG PO TABS: 40.0000 mg | ORAL_TABLET | Freq: Every day | ORAL | Status: DC

## 2014-04-18 NOTE — Assessment & Plan Note (Signed)
Patient had an allergic reaction to her pravastatin. Hives on abdomen and chest, no anaphylactic reaction or angioedema. Discussed case with Dr. Valentina Lucks, and we felt it was safe to try her on a different type of statin, we are trying her on atorvastatin since this also would be covered through the health department/orange card. Patient strongly encouraged to have Benadryl on hand, in the event that she would start to get hives after taking her atorvastatin. Patient encouraged to be diligent about any type of an allergic reaction, and seek emergent care at the emergency room if any shortness of breath mouth swelling or facial hives appear. Follow-up as needed

## 2014-04-18 NOTE — Progress Notes (Signed)
   Subjective:    Patient ID: Betty Chapman, female    DOB: [birth year], 54 y.o.   MRN: [id number]  HPI  Hives: Patient states she had an allergic reaction to her pravastatin that she started 2 days ago. She broke out in hives on her chest and her abdomen, and had some all over body itchiness that lasted approximately 24 hours. She took nothing for the hives and itchiness. It did self resolve. She never had shortness of breath, hives on her face or mouth swelling. She states she went through everything that had changed in the last 24-48 hours in her mind, and the only thing that change was the start of her pravastatin. No new detergents, perfumes, soaps or increased anxiety. Patient has had hives in the past when she was a child during times of high anxiety. She has a past medical history of TIA, hypertension, tobacco abuse and hyperlipidemia.  Current smoker.   Past Medical History  Diagnosis Date  . Tobacco abuse   . TIA (transient ischemic attack)   . Hypertension    Allergies  Allergen Reactions  . Mushroom Extract Complex Anaphylaxis and Rash  . Pravastatin Sodium Hives and Itching    Review of Systems Per history of present illness    Objective:   Physical Exam BP 129/68 mmHg  Pulse 80  Temp(Src) 98.1 F (36.7 C) (Oral)  Ht 5\' 1"  (1.549 m)  Wt 161 lb 12.8 oz (73.392 kg)  BMI 30.59 kg/m2  LMP 12/27/2011 Gen: NAD. Caucasian female. Well-developed, well-nourished, mildly obese. HEENT: AT. [place]. Bilateral eyes without injections or icterus. MMM.  Skin: No rashes, hives, purpura or petechiae.     Assessment & Plan:

## 2014-04-18 NOTE — Patient Instructions (Signed)
I am sorry you had an allergic reaction to your Pravastatin.  I do feel you need to be on a statin medication if possible. I would like to try a different type of statin called atorvastatin. I would like you to have some benadryl on hand in the event you get hives again.  If you get swelling on your face or mouth, shortness of breath, go to ED immediately for care.  Any questions feel free to call.

## 2014-05-01 ENCOUNTER — Encounter: Payer: Self-pay | Admitting: Family Medicine

## 2014-05-14 ENCOUNTER — Encounter: Payer: Self-pay | Admitting: Family Medicine

## 2014-05-14 NOTE — Progress Notes (Unsigned)
Patient needs forms completed so that she may have her medicine sent to her home instead of having to pick it up from the health department. Once completed, please fax to [phone]. The patient would like to be contacted once it has been faxed, please contact Mrs. Maclay at [phone]. Additionally she has been scheduled for a colonoscopy in [place], and she needed to reschedule. She was hoping that Dr. [name] could do this for her. Thanks, [employer], ASA

## 2014-05-14 NOTE — Progress Notes (Unsigned)
Spoke with pt and advised.  1. Was ok with letter being written for her to get meds at home  2.  Was going to give her number to reschedule Appt, but pt states that she already did this  After I hung up, I called the [facility] to clarify what "forms or letter" needed to be completed.  Crystal @ [facility] advised that they do not deliver medications and that she would call and clarify with patient. [place], Salome Spotted, CMA

## 2014-05-16 ENCOUNTER — Ambulatory Visit
Admission: RE | Admit: 2014-05-16 | Discharge: 2014-05-16 | Disposition: A | Discharge status: Home / Self Care | Payer: No Typology Code available for payment source | Source: Ambulatory Visit

## 2014-05-16 DIAGNOSIS — Z1231 Encounter for screening mammogram for malignant neoplasm of breast: Secondary | ICD-10-CM

## 2014-05-17 ENCOUNTER — Encounter: Payer: Self-pay | Admitting: Family Medicine

## 2014-05-23 ENCOUNTER — Other Ambulatory Visit: Payer: Self-pay

## 2014-05-23 ENCOUNTER — Encounter: Payer: Self-pay | Admitting: Nurse Practitioner

## 2014-05-23 ENCOUNTER — Ambulatory Visit (INDEPENDENT_AMBULATORY_CARE_PROVIDER_SITE_OTHER): Payer: Self-pay | Admitting: Nurse Practitioner

## 2014-05-23 VITALS — BP 129/87 | HR 83 | Temp 97.6°F | Ht 60.0 in | Wt 161.6 lb

## 2014-05-23 DIAGNOSIS — Z1211 Encounter for screening for malignant neoplasm of colon: Secondary | ICD-10-CM

## 2014-05-23 DIAGNOSIS — Z716 Tobacco abuse counseling: Secondary | ICD-10-CM | POA: Insufficient documentation

## 2014-05-23 DIAGNOSIS — R1314 Dysphagia, pharyngoesophageal phase: Secondary | ICD-10-CM

## 2014-05-23 DIAGNOSIS — R1310 Dysphagia, unspecified: Secondary | ICD-10-CM

## 2014-05-23 NOTE — Patient Instructions (Signed)
1. We will schedule your procedures (colonoscopy and endoscopy with possible dilation) for you. 2. Further recommendations to be based on the results of your procedure.

## 2014-05-23 NOTE — Progress Notes (Signed)
Primary Care Physician:  Andrena Mews, MD Primary Gastroenterologist:  Dr. Gala Romney  Chief Complaint  Patient presents with  . Dysphagia    HPI:   54 year old female referred to GI from PCP for consideration of routine screening colonoscopy as well as complaints of dysphagia. PCP notes reviewed. Patient was initially referred was earlier in the year but canceled the appointment and is been rereferred by her PCP. No colonoscopy or endoscopy reports found in the system.  Today she states she has had a colonoscopy "many many" years ago, at least greater than 10 years ago. Patient says she does not remember any mention of polyps but it was more than 30 years ago. Began having dysphagia symptoms about 6 months to a year ago. Tends to have episodes a few times a month. Mild to moderate in severity, does make her anxious. Has solid food dysphagia and pill dysphagia. Does not have regurgitation. Able to pass food with liquids afterward. Denies GERD symptoms. Has never had symptoms like this before. Denies abdominal pain, hematochezia, and melena. Rare/occasional nausea, no vomiting. Denies fever, chills, unintentional weight loss. Denies chest pain, dyspnea worse then her baseline for COPD. Denies any additional GI symptoms.  Past Medical History  Diagnosis Date  . Tobacco abuse   . TIA (transient ischemic attack)     Once  . Hypertension   . Hypercholesterolemia   . COPD (chronic obstructive pulmonary disease)     Past Surgical History  Procedure Laterality Date  . Btl    . Cholecystectomy    . Tubal ligation      Current Outpatient Prescriptions  Medication Sig Dispense Refill  . albuterol (PROVENTIL) (2.5 MG/3ML) 0.083% nebulizer solution Take 2.5 mg by nebulization every 6 (six) hours as needed for wheezing or shortness of breath.    [name] albuterol (VENTOLIN HFA) 108 (90 BASE) MCG/ACT inhaler Inhale 2 puffs into the lungs every 6 (six) hours as needed for wheezing. Up to every 4-6  hours. 18 g 5  . cyclobenzaprine (FLEXERIL) 5 MG tablet Take 1 tablet (5 mg total) by mouth 3 (three) times daily as needed for muscle spasms. 30 tablet 0  . hydrochlorothiazide (HYDRODIURIL) 25 MG tablet Take 1 tablet (25 mg total) by mouth daily. 90 tablet 1  . mometasone-formoterol (DULERA) 200-5 MCG/ACT AERO Inhale 2 puffs into the lungs 2 (two) times daily. 8.8 g 0  . tiotropium (SPIRIVA HANDIHALER) 18 MCG inhalation capsule Place 1 capsule (18 mcg total) into inhaler and inhale daily. 30 capsule 12   No current facility-administered medications for this visit.    Allergies as of 05/23/2014 - Review Complete 05/23/2014  Allergen Reaction Noted  . Mushroom extract complex Anaphylaxis and Rash 10/23/2012  . Pravastatin sodium Hives and Itching 04/17/2014    Family History  Problem Relation Age of Onset  . Aneurysm Mother     brain, cause of death  . Colon cancer Neg Hx   . Cancer Brother     Dies of cancer, unsure what primary site  . Prostate cancer Brother     History   Social History  . Marital Status: Married    Spouse Name: N/A  . Number of Children: N/A  . Years of Education: N/A   Occupational History  . Not on file.   Social History Main Topics  . Smoking status: Current Every Day Smoker -- 0.30 packs/day    Types: Cigarettes  . Smokeless tobacco: Never Used  . Alcohol Use: 0.0 oz/week  0 Standard drinks or equivalent per week     Comment: Rarely, none in the past year (as of 05/23/14)  . Drug Use: Yes    Special: Marijuana     Comment: Occasionally  . Sexual Activity: Yes    Birth Control/ Protection: Surgical   Other Topics Concern  . Not on file   Social History Narrative    Review of Systems: General: Negative for anorexia, weight loss, fever, chills, fatigue, weakness. Eyes: Negative for vision changes.  ENT: Negative for hoarseness. CV: Negative for chest pain, angina, palpitations, peripheral edema. Respiratory: Negative for dyspnea at  rest. Admits dyspnea with exertion consistent with her COPD history. Denies worsening cough, sputum, wheezing.  GI: See history of present illness. MS: Negative for joint pain, low back pain.  Derm: Negative for rash or itching.  Neuro: Negative for weakness, frequent headaches, memory loss, confusion.  Psych: Negative for anxiety, depression.  Endo: Negative for unusual weight change.  Heme: Negative for bruising or bleeding. Allergy: Negative for rash or hives.    Physical Exam: BP 129/87 mmHg  Pulse 83  Temp(Src) 97.6 F (36.4 C) (Oral)  Ht 5' (1.524 m)  Wt 161 lb 9.6 oz (73.301 kg)  BMI 31.56 kg/m2  LMP 12/27/2011 General:   Alert and oriented. Pleasant and cooperative. Well-nourished and well-developed.  Head:  Normocephalic and atraumatic. Eyes:  Without icterus, sclera clear and conjunctiva pink.  Ears:  Normal auditory acuity. Mouth:  No deformity or lesions, oral mucosa pink. No OP edema.  Throat/Neck:  Supple, without mass or thyromegaly. No edema. Cardiovascular:  S1, S2 present without murmurs appreciated. Normal pulses noted. Extremities without clubbing or edema. Respiratory:  Clear to auscultation bilaterally. No wheezes, rales, or rhonchi. No distress.  Gastrointestinal:  +BS, soft, and non-distended. Mild epigastric TTP noted. No HSM noted. No guarding or rebound. No masses appreciated.  Rectal:  Deferred  Musculoskalatal:  Symmetrical without gross deformities. Normal posture. Skin:  Intact without significant lesions or rashes. Neurologic:  Alert and oriented x4;  grossly normal neurologically. Psych:  Alert and cooperative. Normal mood and affect. Heme/Lymph/Immune: No significant cervical adenopathy. No excessive bruising noted.    05/23/2014 1:57 PM

## 2014-05-24 NOTE — Assessment & Plan Note (Signed)
54 year old female presents for screening colonoscopy. States she had a colonoscopy "many years ago" which is correlated to approximately 30 years ago. She is now 54 years old and of age for routine screening colonoscopy. Except for dysphagia is generally a symptomatically GI standpoint. No red flag/warning signs or symptoms. At this point we'll proceed with screening colonoscopy and will add on endoscopy of possible dilation due to dysphagia (see dysphagia assessment and plan note)  Proceed with TCS and EGD +/- dilation with Dr. Gala Romney in near future: the risks, benefits, and alternatives have been discussed with the patient in detail. The patient states understanding and desires to proceed.  Patient is not on any anticoagulants, antidepressants, chronic pain medication, chronic anxiety medication. Admits rare alcohol use. Has occasional marijuana use. Should be okay with conscious sedation. Will povide for 12.5 mg preprocedure Phenergan.[name]

## 2014-05-24 NOTE — Assessment & Plan Note (Signed)
54 year old female with dysphagia occurring a few times a month. Has solid food dysphagia, denies pill dysphagia. No regurgitation. Is generally able to get food to pass that these mental liquids after dysphagia symptoms began. No other red flag/warning signs or symptoms. Patient is due for colonoscopy at this point so we will add on an endoscopy with possible dilation to further workup her dysphagia symptoms. Possible age-related dysmotility cannot rule out stricture, web, ring. Denies GERD symptoms are less likely esophagitis-induced dysphagia.  Proceed with TCS and EGD +/- dilation with Dr. Gala Romney in near future: the risks, benefits, and alternatives have been discussed with the patient in detail. The patient states understanding and desires to proceed.  Patient is not on any anticoagulants, antidepressants, chronic pain medication, chronic anxiety medication. Admits rare alcohol use. Has occasional marijuana use. Should be okay with conscious sedation. Will povide for 12.5 mg preprocedure Phenergan.

## 2014-05-30 ENCOUNTER — Ambulatory Visit: Payer: Self-pay

## 2014-06-04 ENCOUNTER — Ambulatory Visit: Payer: Self-pay

## 2014-06-10 ENCOUNTER — Ambulatory Visit (HOSPITAL_COMMUNITY)
Admission: RE | Admit: 2014-06-10 | Discharge: 2014-06-10 | Disposition: A | Discharge status: Home / Self Care | Payer: Self-pay | Source: Ambulatory Visit | Attending: Internal Medicine | Admitting: Internal Medicine

## 2014-06-10 ENCOUNTER — Encounter (HOSPITAL_COMMUNITY)
Admission: RE | Disposition: A | Discharge status: Home / Self Care | Payer: Self-pay | Source: Ambulatory Visit | Attending: Internal Medicine

## 2014-06-10 ENCOUNTER — Encounter (HOSPITAL_COMMUNITY): Payer: Self-pay

## 2014-06-10 DIAGNOSIS — Z1211 Encounter for screening for malignant neoplasm of colon: Secondary | ICD-10-CM

## 2014-06-10 DIAGNOSIS — Q394 Esophageal web: Secondary | ICD-10-CM

## 2014-06-10 DIAGNOSIS — K2100 Gastro-esophageal reflux disease with esophagitis, without bleeding: Secondary | ICD-10-CM | POA: Insufficient documentation

## 2014-06-10 DIAGNOSIS — D125 Benign neoplasm of sigmoid colon: Secondary | ICD-10-CM | POA: Insufficient documentation

## 2014-06-10 DIAGNOSIS — J449 Chronic obstructive pulmonary disease, unspecified: Secondary | ICD-10-CM | POA: Insufficient documentation

## 2014-06-10 DIAGNOSIS — F17210 Nicotine dependence, cigarettes, uncomplicated: Secondary | ICD-10-CM | POA: Insufficient documentation

## 2014-06-10 DIAGNOSIS — K222 Esophageal obstruction: Secondary | ICD-10-CM | POA: Insufficient documentation

## 2014-06-10 DIAGNOSIS — I10 Essential (primary) hypertension: Secondary | ICD-10-CM | POA: Insufficient documentation

## 2014-06-10 DIAGNOSIS — Z7951 Long term (current) use of inhaled steroids: Secondary | ICD-10-CM | POA: Insufficient documentation

## 2014-06-10 DIAGNOSIS — K449 Diaphragmatic hernia without obstruction or gangrene: Secondary | ICD-10-CM | POA: Insufficient documentation

## 2014-06-10 DIAGNOSIS — Z79899 Other long term (current) drug therapy: Secondary | ICD-10-CM | POA: Insufficient documentation

## 2014-06-10 DIAGNOSIS — Z9049 Acquired absence of other specified parts of digestive tract: Secondary | ICD-10-CM | POA: Insufficient documentation

## 2014-06-10 DIAGNOSIS — D123 Benign neoplasm of transverse colon: Secondary | ICD-10-CM | POA: Insufficient documentation

## 2014-06-10 DIAGNOSIS — R1314 Dysphagia, pharyngoesophageal phase: Secondary | ICD-10-CM

## 2014-06-10 DIAGNOSIS — Z8673 Personal history of transient ischemic attack (TIA), and cerebral infarction without residual deficits: Secondary | ICD-10-CM | POA: Insufficient documentation

## 2014-06-10 HISTORY — PX: COLONOSCOPY: SHX5424

## 2014-06-10 HISTORY — PX: MALONEY DILATION: SHX5535

## 2014-06-10 HISTORY — PX: ESOPHAGOGASTRODUODENOSCOPY: SHX5428

## 2014-06-10 SURGERY — COLONOSCOPY
Anesthesia: Moderate Sedation

## 2014-06-10 MED ORDER — LIDOCAINE VISCOUS 2 % MT SOLN
OROMUCOSAL | Status: AC
  Filled 2014-06-10: qty 15

## 2014-06-10 MED ORDER — PROMETHAZINE HCL 25 MG/ML IJ SOLN
12.5000 mg | Freq: Once | INTRAMUSCULAR | Status: AC
  Administered 2014-06-10: 12.5 mg via INTRAVENOUS

## 2014-06-10 MED ORDER — STERILE WATER FOR IRRIGATION IR SOLN
Status: DC | PRN
  Administered 2014-06-10: 13:00:00

## 2014-06-10 MED ORDER — ONDANSETRON HCL 4 MG/2ML IJ SOLN
INTRAMUSCULAR | Status: AC
  Filled 2014-06-10: qty 2

## 2014-06-10 MED ORDER — PROMETHAZINE HCL 25 MG/ML IJ SOLN
INTRAMUSCULAR | Status: AC
  Filled 2014-06-10: qty 1

## 2014-06-10 MED ORDER — MEPERIDINE HCL 100 MG/ML IJ SOLN
INTRAMUSCULAR | Status: DC | PRN
  Administered 2014-06-10: 25 mg via INTRAVENOUS
  Administered 2014-06-10: 50 mg via INTRAVENOUS
  Administered 2014-06-10: 25 mg via INTRAVENOUS

## 2014-06-10 MED ORDER — ONDANSETRON HCL 4 MG/2ML IJ SOLN
INTRAMUSCULAR | Status: DC | PRN
  Administered 2014-06-10: 4 mg via INTRAVENOUS

## 2014-06-10 MED ORDER — LIDOCAINE VISCOUS 2 % MT SOLN
OROMUCOSAL | Status: DC | PRN
  Administered 2014-06-10: 4 mL via OROMUCOSAL

## 2014-06-10 MED ORDER — MEPERIDINE HCL 100 MG/ML IJ SOLN
INTRAMUSCULAR | Status: AC
  Filled 2014-06-10: qty 2

## 2014-06-10 MED ORDER — SODIUM CHLORIDE 0.9 % IJ SOLN
INTRAMUSCULAR | Status: AC
  Filled 2014-06-10: qty 3

## 2014-06-10 MED ORDER — SODIUM CHLORIDE 0.9 % IV SOLN
INTRAVENOUS | Status: DC
  Administered 2014-06-10: 13:00:00 via INTRAVENOUS

## 2014-06-10 MED ORDER — MIDAZOLAM HCL 5 MG/5ML IJ SOLN
INTRAMUSCULAR | Status: DC | PRN
  Administered 2014-06-10: 1 mg via INTRAVENOUS
  Administered 2014-06-10: 2 mg via INTRAVENOUS
  Administered 2014-06-10: 1 mg via INTRAVENOUS

## 2014-06-10 MED ORDER — MIDAZOLAM HCL 5 MG/5ML IJ SOLN
INTRAMUSCULAR | Status: AC
  Filled 2014-06-10: qty 10

## 2014-06-10 NOTE — H&P (View-Only) (Signed)
Primary Care Physician:  Andrena Mews, MD Primary Gastroenterologist:  Dr. Gala Romney  Chief Complaint  Patient presents with  . Dysphagia    HPI:   54 year old female referred to GI from PCP for consideration of routine screening colonoscopy as well as complaints of dysphagia. PCP notes reviewed. Patient was initially referred was earlier in the year but canceled the appointment and is been rereferred by her PCP. No colonoscopy or endoscopy reports found in the system.  Today she states she has had a colonoscopy "many many" years ago, at least greater than 10 years ago. Patient says she does not remember any mention of polyps but it was more than 30 years ago. Began having dysphagia symptoms about 6 months to a year ago. Tends to have episodes a few times a month. Mild to moderate in severity, does make her anxious. Has solid food dysphagia and pill dysphagia. Does not have regurgitation. Able to pass food with liquids afterward. Denies GERD symptoms. Has never had symptoms like this before. Denies abdominal pain, hematochezia, and melena. Rare/occasional nausea, no vomiting. Denies fever, chills, unintentional weight loss. Denies chest pain, dyspnea worse then her baseline for COPD. Denies any additional GI symptoms.  Past Medical History  Diagnosis Date  . Tobacco abuse   . TIA (transient ischemic attack)     Once  . Hypertension   . Hypercholesterolemia   . COPD (chronic obstructive pulmonary disease)     Past Surgical History  Procedure Laterality Date  . Btl    . Cholecystectomy    . Tubal ligation      Current Outpatient Prescriptions  Medication Sig Dispense Refill  . albuterol (PROVENTIL) (2.5 MG/3ML) 0.083% nebulizer solution Take 2.5 mg by nebulization every 6 (six) hours as needed for wheezing or shortness of breath.    [name] albuterol (VENTOLIN HFA) 108 (90 BASE) MCG/ACT inhaler Inhale 2 puffs into the lungs every 6 (six) hours as needed for wheezing. Up to every 4-6  hours. 18 g 5  . cyclobenzaprine (FLEXERIL) 5 MG tablet Take 1 tablet (5 mg total) by mouth 3 (three) times daily as needed for muscle spasms. 30 tablet 0  . hydrochlorothiazide (HYDRODIURIL) 25 MG tablet Take 1 tablet (25 mg total) by mouth daily. 90 tablet 1  . mometasone-formoterol (DULERA) 200-5 MCG/ACT AERO Inhale 2 puffs into the lungs 2 (two) times daily. 8.8 g 0  . tiotropium (SPIRIVA HANDIHALER) 18 MCG inhalation capsule Place 1 capsule (18 mcg total) into inhaler and inhale daily. 30 capsule 12   No current facility-administered medications for this visit.    Allergies as of 05/23/2014 - Review Complete 05/23/2014  Allergen Reaction Noted  . Mushroom extract complex Anaphylaxis and Rash 10/23/2012  . Pravastatin sodium Hives and Itching 04/17/2014    Family History  Problem Relation Age of Onset  . Aneurysm Mother     brain, cause of death  . Colon cancer Neg Hx   . Cancer Brother     Dies of cancer, unsure what primary site  . Prostate cancer Brother     History   Social History  . Marital Status: Married    Spouse Name: N/A  . Number of Children: N/A  . Years of Education: N/A   Occupational History  . Not on file.   Social History Main Topics  . Smoking status: Current Every Day Smoker -- 0.30 packs/day    Types: Cigarettes  . Smokeless tobacco: Never Used  . Alcohol Use: 0.0 oz/week  0 Standard drinks or equivalent per week     Comment: Rarely, none in the past year (as of 05/23/14)  . Drug Use: Yes    Special: Marijuana     Comment: Occasionally  . Sexual Activity: Yes    Birth Control/ Protection: Surgical   Other Topics Concern  . Not on file   Social History Narrative    Review of Systems: General: Negative for anorexia, weight loss, fever, chills, fatigue, weakness. Eyes: Negative for vision changes.  ENT: Negative for hoarseness. CV: Negative for chest pain, angina, palpitations, peripheral edema. Respiratory: Negative for dyspnea at  rest. Admits dyspnea with exertion consistent with her COPD history. Denies worsening cough, sputum, wheezing.  GI: See history of present illness. MS: Negative for joint pain, low back pain.  Derm: Negative for rash or itching.  Neuro: Negative for weakness, frequent headaches, memory loss, confusion.  Psych: Negative for anxiety, depression.  Endo: Negative for unusual weight change.  Heme: Negative for bruising or bleeding. Allergy: Negative for rash or hives.    Physical Exam: BP 129/87 mmHg  Pulse 83  Temp(Src) 97.6 F (36.4 C) (Oral)  Ht 5' (1.524 m)  Wt 161 lb 9.6 oz (73.301 kg)  BMI 31.56 kg/m2  LMP 12/27/2011 General:   Alert and oriented. Pleasant and cooperative. Well-nourished and well-developed.  Head:  Normocephalic and atraumatic. Eyes:  Without icterus, sclera clear and conjunctiva pink.  Ears:  Normal auditory acuity. Mouth:  No deformity or lesions, oral mucosa pink. No OP edema.  Throat/Neck:  Supple, without mass or thyromegaly. No edema. Cardiovascular:  S1, S2 present without murmurs appreciated. Normal pulses noted. Extremities without clubbing or edema. Respiratory:  Clear to auscultation bilaterally. No wheezes, rales, or rhonchi. No distress.  Gastrointestinal:  +BS, soft, and non-distended. Mild epigastric TTP noted. No HSM noted. No guarding or rebound. No masses appreciated.  Rectal:  Deferred  Musculoskalatal:  Symmetrical without gross deformities. Normal posture. Skin:  Intact without significant lesions or rashes. Neurologic:  Alert and oriented x4;  grossly normal neurologically. Psych:  Alert and cooperative. Normal mood and affect. Heme/Lymph/Immune: No significant cervical adenopathy. No excessive bruising noted.    05/23/2014 1:57 PM

## 2014-06-10 NOTE — Discharge Instructions (Signed)
Colonoscopy Discharge Instructions  Read the instructions outlined below and refer to this sheet in the next few weeks. These discharge instructions provide you with general information on caring for yourself after you leave the hospital. Your doctor may also give you specific instructions. While your treatment has been planned according to the most current medical practices available, unavoidable complications occasionally occur. If you have any problems or questions after discharge, call Dr. Gala Romney at [phone]. ACTIVITY  You may resume your regular activity, but move at a slower pace for the next 24 hours.   Take frequent rest periods for the next 24 hours.   Walking will help get rid of the air and reduce the bloated feeling in your belly (abdomen).   No driving for 24 hours (because of the medicine (anesthesia) used during the test).    Do not sign any important legal documents or operate any machinery for 24 hours (because of the anesthesia used during the test).  NUTRITION  Drink plenty of fluids.   You may resume your normal diet as instructed by your doctor.   Begin with a light meal and progress to your normal diet. Heavy or fried foods are harder to digest and may make you feel sick to your stomach (nauseated).   Avoid alcoholic beverages for 24 hours or as instructed.  MEDICATIONS  You may resume your normal medications unless your doctor tells you otherwise.  WHAT YOU CAN EXPECT TODAY  Some feelings of bloating in the abdomen.   Passage of more gas than usual.   Spotting of blood in your stool or on the toilet paper.  IF YOU HAD POLYPS REMOVED DURING THE COLONOSCOPY:  No aspirin products for 7 days or as instructed.   No alcohol for 7 days or as instructed.   Eat a soft diet for the next 24 hours.  FINDING OUT THE RESULTS OF YOUR TEST Not all test results are available during your visit. If your test results are not back during the visit, make an appointment  with your caregiver to find out the results. Do not assume everything is normal if you have not heard from your caregiver or the medical facility. It is important for you to follow up on all of your test results.  SEEK IMMEDIATE MEDICAL ATTENTION IF:  You have more than a spotting of blood in your stool.   Your belly is swollen (abdominal distention).   You are nauseated or vomiting.   You have a temperature over 101.  You have abdominal pain or discomfort that is severe or gets worse throughout the day.  Colonoscopy Discharge Instructions  Read the instructions outlined below and refer to this sheet in the next few weeks. These discharge instructions provide you with general information on caring for yourself after you leave the hospital. Your doctor may also give you specific instructions. While your treatment has been planned according to the most current medical practices available, unavoidable complications occasionally occur. If you have any problems or questions after discharge, call Dr. Gala Romney at [phone]. ACTIVITY You may resume your regular activity, but move at a slower pace for the next 24 hours.  Take frequent rest periods for the next 24 hours.  Walking will help get rid of the air and reduce the bloated feeling in your belly (abdomen).  No driving for 24 hours (because of the medicine (anesthesia) used during the test).   Do not sign any important legal documents or operate any machinery for 24 hours (  because of the anesthesia used during the test).  NUTRITION Drink plenty of fluids.  You may resume your normal diet as instructed by your doctor.  Begin with a light meal and progress to your normal diet. Heavy or fried foods are harder to digest and may make you feel sick to your stomach (nauseated).  Avoid alcoholic beverages for 24 hours or as instructed.  MEDICATIONS You may resume your normal medications unless your doctor tells you otherwise.  WHAT YOU CAN EXPECT  TODAY Some feelings of bloating in the abdomen.  Passage of more gas than usual.  Spotting of blood in your stool or on the toilet paper.  IF YOU HAD POLYPS REMOVED DURING THE COLONOSCOPY: No aspirin products for 7 days or as instructed.  No alcohol for 7 days or as instructed.  Eat a soft diet for the next 24 hours.  FINDING OUT THE RESULTS OF YOUR TEST Not all test results are available during your visit. If your test results are not back during the visit, make an appointment with your caregiver to find out the results. Do not assume everything is normal if you have not heard from your caregiver or the medical facility. It is important for you to follow up on all of your test results.  SEEK IMMEDIATE MEDICAL ATTENTION IF: You have more than a spotting of blood in your stool.  Your belly is swollen (abdominal distention).  You are nauseated or vomiting.  You have a temperature over 101.   You have abdominal pain or discomfort that is severe or gets worse throughout the day.    GERD and polyp information provided  Begin Protonix 40 mg daily  Further recommendations to follow pending review of pathology report  Colon Polyps Polyps are lumps of extra tissue growing inside the body. Polyps can grow in the large intestine (colon). Most colon polyps are noncancerous (benign). However, some colon polyps can become cancerous over time. Polyps that are larger than a pea may be harmful. To be safe, caregivers remove and test all polyps. CAUSES  Polyps form when mutations in the genes cause your cells to grow and divide even though no more tissue is needed. RISK FACTORS There are a number of risk factors that can increase your chances of getting colon polyps. They include:  Being older than 50 years.  Family history of colon polyps or colon cancer.  Long-term colon diseases, such as colitis or Crohn disease.  Being overweight.  Smoking.  Being inactive.  Drinking too much  alcohol. SYMPTOMS  Most small polyps do not cause symptoms. If symptoms are present, they may include:  Blood in the stool. The stool may look dark red or black.  Constipation or diarrhea that lasts longer than 1 week. DIAGNOSIS People often do not know they have polyps until their caregiver finds them during a regular checkup. Your caregiver can use 4 tests to check for polyps:  Digital rectal exam. The caregiver wears gloves and feels inside the rectum. This test would find polyps only in the rectum.  Barium enema. The caregiver puts a liquid called barium into your rectum before taking X-rays of your colon. Barium makes your colon look white. Polyps are dark, so they are easy to see in the X-ray pictures.  Sigmoidoscopy. A thin, flexible tube (sigmoidoscope) is placed into your rectum. The sigmoidoscope has a light and tiny camera in it. The caregiver uses the sigmoidoscope to look at the last third of your colon.  Colonoscopy.  This test is like sigmoidoscopy, but the caregiver looks at the entire colon. This is the most common method for finding and removing polyps. TREATMENT  Any polyps will be removed during a sigmoidoscopy or colonoscopy. The polyps are then tested for cancer. PREVENTION  To help lower your risk of getting more colon polyps:  Eat plenty of fruits and vegetables. Avoid eating fatty foods.  Do not smoke.  Avoid drinking alcohol.  Exercise every day.  Lose weight if recommended by your caregiver.  Eat plenty of calcium and folate. Foods that are rich in calcium include milk, cheese, and broccoli. Foods that are rich in folate include chickpeas, kidney beans, and spinach. HOME CARE INSTRUCTIONS Keep all follow-up appointments as directed by your caregiver. You may need periodic exams to check for polyps. SEEK MEDICAL CARE IF: You notice bleeding during a bowel movement. Document Released: 09/17/2003 Document Revised: 03/15/2011 Document Reviewed:  03/02/2011 [facility] Patient Information 2015 [place], [place]. This information is not intended to replace advice given to you by your health care provider. Make sure you discuss any questions you have with your health care provider.    Gastroesophageal Reflux Disease, Adult Gastroesophageal reflux disease (GERD) happens when acid from your stomach flows up into the esophagus. When acid comes in contact with the esophagus, the acid causes soreness (inflammation) in the esophagus. Over time, GERD may create small holes (ulcers) in the lining of the esophagus. CAUSES   Increased body weight. This puts pressure on the stomach, making acid rise from the stomach into the esophagus.  Smoking. This increases acid production in the stomach.  Drinking alcohol. This causes decreased pressure in the lower esophageal sphincter (valve or ring of muscle between the esophagus and stomach), allowing acid from the stomach into the esophagus.  Late evening meals and a full stomach. This increases pressure and acid production in the stomach.  A malformed lower esophageal sphincter. Sometimes, no cause is found. SYMPTOMS   Burning pain in the lower part of the mid-chest behind the breastbone and in the mid-stomach area. This may occur twice a week or more often.  Trouble swallowing.  Sore throat.  Dry cough.  Asthma-like symptoms including chest tightness, shortness of breath, or wheezing. DIAGNOSIS  Your caregiver may be able to diagnose GERD based on your symptoms. In some cases, X-rays and other tests may be done to check for complications or to check the condition of your stomach and esophagus. TREATMENT  Your caregiver may recommend over-the-counter or prescription medicines to help decrease acid production. Ask your caregiver before starting or adding any new medicines.  HOME CARE INSTRUCTIONS   Change the factors that you can control. Ask your caregiver for guidance concerning weight loss,  quitting smoking, and alcohol consumption.  Avoid foods and drinks that make your symptoms worse, such as:  Caffeine or alcoholic drinks.  Chocolate.  Peppermint or mint flavorings.  Garlic and onions.  Spicy foods.  Citrus fruits, such as oranges, lemons, or limes.  Tomato-based foods such as sauce, chili, salsa, and pizza.  Fried and fatty foods.  Avoid lying down for the 3 hours prior to your bedtime or prior to taking a nap.  Eat small, frequent meals instead of large meals.  Wear loose-fitting clothing. Do not wear anything tight around your waist that causes pressure on your stomach.  Raise the head of your bed 6 to 8 inches with wood blocks to help you sleep. Extra pillows will not help.  Only take over-the-counter or  prescription medicines for pain, discomfort, or fever as directed by your caregiver.  Do not take aspirin, ibuprofen, or other nonsteroidal anti-inflammatory drugs (NSAIDs). SEEK IMMEDIATE MEDICAL CARE IF:   You have pain in your arms, neck, jaw, teeth, or back.  Your pain increases or changes in intensity or duration.  You develop nausea, vomiting, or sweating (diaphoresis).  You develop shortness of breath, or you faint.  Your vomit is green, yellow, black, or looks like coffee grounds or blood.  Your stool is red, bloody, or black. These symptoms could be signs of other problems, such as heart disease, gastric bleeding, or esophageal bleeding. MAKE SURE YOU:   Understand these instructions.  Will watch your condition.  Will get help right away if you are not doing well or get worse. Document Released: 09/30/2004 Document Revised: 03/15/2011 Document Reviewed: 07/10/2010 [facility] Patient Information 2015 [place], [place]. This information is not intended to replace advice given to you by your health care provider. Make sure you discuss any questions you have with your health care provider.

## 2014-06-10 NOTE — Op Note (Signed)
[facility] [address] [place], [address]   ENDOSCOPY PROCEDURE REPORT  PATIENT: Betty Chapman, Betty Chapman  MR#: [id number] BIRTHDATE: 1960-07-17 , 36  yrs. old GENDER: female ENDOSCOPIST: R.  Garfield Cornea, MD FACP [facility] REFERRED BY:     Dr. Laurian Brim PROCEDURE DATE:  Jun 28, 2014 PROCEDURE:  EGD, diagnostic and Maloney dilation of esophagus INDICATIONS:  Esophageal dysphagia MEDICATIONS: Versed 4 mg IV and Demerol 75 mg IV in divided doses. Phenergan 12.5 mg IV.  Zofran 4 mg IV. ASA CLASS:      Class II  CONSENT: The risks, benefits, limitations, alternatives and imponderables have been discussed.  The potential for biopsy, esophogeal dilation, etc. have also been reviewed.  Questions have been answered.  All parties agreeable.  Please see the history and physical in the medical record for more information.  DESCRIPTION OF PROCEDURE: After the risks benefits and alternatives of the procedure were thoroughly explained, informed consent was obtained.  The [id number] ([id number]) endoscope was introduced through the mouth and advanced to the second portion of the duodenum , limited by Without limitations. The instrument was slowly withdrawn as the mucosa was fully examined.    Schatzki's ring present.  Four-quadrant distal esophageal erosions within 3 mm the GE junction.  No tumor.  No Barrett's esophagus. Stomach empty.  2 cm hiatal hernia.  Normal-appearing gastric mucosa.  Patent pylorus.  Normal-appearing first and second portion of the duodenum.  Scope was removed and a 46 [place] Maloney dilator was passed to full insertion easily.  A look back reveal no apparent complication related to this maneuver.  Retroflexed views revealed a hiatal hernia.     The scope was then withdrawn from the patient and the procedure completed.  COMPLICATIONS: There were no immediate complications.  ENDOSCOPIC IMPRESSION: Mild erosive reflux esophagitis. Schatzki's ring?"status  post dilation as described above. Hiatal hernia.  RECOMMENDATIONS: Begin Protonix 40 mg daily. See colonoscopy report.  REPEAT EXAM:  eSigned:  R. Garfield Cornea, MD Rosalita Chessman [facility] 06/28/14 1:47 PM    CC:  CPT CODES: ICD CODES:  The ICD and CPT codes recommended by this software are interpretations from the data that the clinical staff has captured with the software.  The verification of the translation of this report to the ICD and CPT codes and modifiers is the sole responsibility of the health care institution and practicing physician where this report was generated.  [place]. will not be held responsible for the validity of the ICD and CPT codes included on this report.  AMA assumes no liability for data contained or not contained herein. CPT is a Designer, television/film set of the [employer].  PATIENT NAME:  Giulia, Hickey MR#: [id number]

## 2014-06-10 NOTE — Op Note (Signed)
[facility] [address] [place], [address]   COLONOSCOPY PROCEDURE REPORT  PATIENT: Betty Chapman, Betty Chapman  MR#: [id number] BIRTHDATE: [birth year] , 63  yrs. old GENDER: female ENDOSCOPIST: R.  Garfield Cornea, MD [place] [address]:[id number] [name], MD PROCEDURE DATE:  07-04-14 PROCEDURE:   Colonoscopy with snare polypectomy INDICATIONS:Average risk colorectal cancer screening examination. MEDICATIONS: Versed 4 mg IV and Demerol 100 mg IV in divided doses. Phenergan 12.5 mg IV.  Zofran 4 mg IV. ASA CLASS:       Class II  CONSENT: The risks, benefits, alternatives and imponderables including but not limited to bleeding, perforation as well as the possibility of a missed lesion have been reviewed.  The potential for biopsy, lesion removal, etc. have also been discussed. Questions have been answered.  All parties agreeable.  Please see the history and physical in the medical record for more information.  DESCRIPTION OF PROCEDURE:   After the risks benefits and alternatives of the procedure were thoroughly explained, informed consent was obtained.  The digital rectal exam revealed no abnormalities of the rectum.   The EC-[id number] ([id number])  endoscope was introduced through the anus and advanced to the cecum, which was identified by both the appendix and ileocecal valve. No adverse events experienced.   The quality of the prep was adequate  The instrument was then slowly withdrawn as the colon was fully examined.      COLON FINDINGS: Normal-appearing rectal mucosa; (1) 7 mm pedunculated polyp in the sigmoid segment and (1) 6 mm polyp at the splenic flexure; otherwise, the remainder of the colonic mucosa. apeared normal.  The above-mentioned polyps were hot and cold snare removed, respectively.  Retroflexion was performed. .  Withdrawal time=11 minutes 0 seconds.  The scope was withdrawn and the procedure completed. COMPLICATIONS: There were no immediate  complications.  ENDOSCOPIC IMPRESSION: Multiple colonic polyps?"removed as described above.  RECOMMENDATIONS: Follow-up on pathology. See EGD report.  eSigned:  R. Garfield Cornea, MD Rosalita Chessman Mar[facility] Regal 2014/07/04 2:18 PM   cc:  CPT CODES: ICD CODES:  The ICD and CPT codes recommended by this software are interpretations from the data that the clinical staff has captured with the software.  The verification of the translation of this report to the ICD and CPT codes and modifiers is the sole responsibility of the health care institution and practicing physician where this report was generated.  [place]. will not be held responsible for the validity of the ICD and CPT codes included on this report.  AMA assumes no liability for data contained or not contained herein. CPT is a Designer, television/film set of the [employer].  PATIENT NAME:  [name], [name] MR#: [id number]

## 2014-06-10 NOTE — Interval H&P Note (Signed)
History and Physical Interval Note:  06/10/2014 1:21 PM  Betty Chapman  has presented today for surgery, with the diagnosis of screening colonoscopy, dysphagia  The various methods of treatment have been discussed with the patient and family. After consideration of risks, benefits and other options for treatment, the patient has consented to  Procedure(s) with comments: COLONOSCOPY (N/A) - 1315 ESOPHAGOGASTRODUODENOSCOPY (EGD) (N/A) MALONEY DILATION (N/A) as a surgical intervention .  The patient's history has been reviewed, patient examined, no change in status, stable for surgery.  I have reviewed the patient's chart and labs.  Questions were answered to the patient's satisfaction.     Robert Rourk   No change. EGD with esophageal dilation as feasible/appropriate and screening colonoscopy per plan.  The risks, benefits, limitations, imponderables and alternatives regarding both EGD and colonoscopy have been reviewed with the patient. Questions have been answered. All parties agreeable.

## 2014-06-11 ENCOUNTER — Encounter (HOSPITAL_COMMUNITY): Payer: Self-pay | Admitting: Internal Medicine

## 2014-06-12 NOTE — Progress Notes (Signed)
CC'ED TO PCP 

## 2014-06-26 ENCOUNTER — Encounter: Payer: Self-pay | Admitting: Internal Medicine

## 2014-07-13 ENCOUNTER — Emergency Department (HOSPITAL_COMMUNITY)
Admission: EM | Admit: 2014-07-13 | Discharge: 2014-07-14 | Disposition: A | Discharge status: Home / Self Care | Payer: Self-pay

## 2014-07-13 ENCOUNTER — Encounter (HOSPITAL_COMMUNITY): Payer: Self-pay | Admitting: *Deleted

## 2014-07-13 DIAGNOSIS — Z79899 Other long term (current) drug therapy: Secondary | ICD-10-CM | POA: Insufficient documentation

## 2014-07-13 DIAGNOSIS — I10 Essential (primary) hypertension: Secondary | ICD-10-CM | POA: Insufficient documentation

## 2014-07-13 DIAGNOSIS — F39 Unspecified mood [affective] disorder: Secondary | ICD-10-CM | POA: Diagnosis present

## 2014-07-13 DIAGNOSIS — F99 Mental disorder, not otherwise specified: Secondary | ICD-10-CM | POA: Insufficient documentation

## 2014-07-13 DIAGNOSIS — Z720 Tobacco use: Secondary | ICD-10-CM | POA: Insufficient documentation

## 2014-07-13 DIAGNOSIS — Z8639 Personal history of other endocrine, nutritional and metabolic disease: Secondary | ICD-10-CM | POA: Insufficient documentation

## 2014-07-13 DIAGNOSIS — Z7951 Long term (current) use of inhaled steroids: Secondary | ICD-10-CM | POA: Insufficient documentation

## 2014-07-13 DIAGNOSIS — J449 Chronic obstructive pulmonary disease, unspecified: Secondary | ICD-10-CM | POA: Insufficient documentation

## 2014-07-13 DIAGNOSIS — Z8673 Personal history of transient ischemic attack (TIA), and cerebral infarction without residual deficits: Secondary | ICD-10-CM | POA: Insufficient documentation

## 2014-07-13 DIAGNOSIS — R45851 Suicidal ideations: Secondary | ICD-10-CM

## 2014-07-13 LAB — CBC
HCT: 46.2 % — ABNORMAL HIGH (ref 36.0–46.0)
Hemoglobin: 15.7 g/dL — ABNORMAL HIGH (ref 12.0–15.0)
MCH: 31.7 pg (ref 26.0–34.0)
MCHC: 34 g/dL (ref 30.0–36.0)
MCV: 93.3 fL (ref 78.0–100.0)
Platelets: 314 10*3/uL (ref 150–400)
RBC: 4.95 MIL/uL (ref 3.87–5.11)
RDW: 13.7 % (ref 11.5–15.5)
WBC: 9.3 10*3/uL (ref 4.0–10.5)

## 2014-07-13 LAB — COMPREHENSIVE METABOLIC PANEL
ALT: 17 U/L (ref 14–54)
AST: 19 U/L (ref 15–41)
Albumin: 4.1 g/dL (ref 3.5–5.0)
Alkaline Phosphatase: 107 U/L (ref 38–126)
Anion gap: 10 (ref 5–15)
BUN: 14 mg/dL (ref 6–20)
CO2: 22 mmol/L (ref 22–32)
Calcium: 9.2 mg/dL (ref 8.9–10.3)
Chloride: 110 mmol/L (ref 101–111)
Creatinine, Ser: 0.56 mg/dL (ref 0.44–1.00)
GFR calc Af Amer: >60 mL/min (ref >60)
GFR calc non Af Amer: >60 mL/min (ref >60)
Glucose, Bld: 124 mg/dL — ABNORMAL HIGH (ref 65–99)
Potassium: 3.9 mmol/L (ref 3.5–5.1)
Sodium: 142 mmol/L (ref 135–145)
Total Bilirubin: 0.5 mg/dL (ref 0.3–1.2)
Total Protein: 7.3 g/dL (ref 6.5–8.1)

## 2014-07-13 LAB — ACETAMINOPHEN LEVEL: Acetaminophen (Tylenol), Serum: <10 ug/mL — ABNORMAL LOW (ref 10–30)

## 2014-07-13 LAB — RAPID URINE DRUG SCREEN, HOSP PERFORMED
Amphetamines: NOT DETECTED
Barbiturates: NOT DETECTED
Benzodiazepines: NOT DETECTED
Cocaine: NOT DETECTED
Opiates: NOT DETECTED
Tetrahydrocannabinol: POSITIVE — AB

## 2014-07-13 LAB — ETHANOL: Alcohol, Ethyl (B): <5 mg/dL (ref <5)

## 2014-07-13 LAB — SALICYLATE LEVEL: Salicylate Lvl: <4 mg/dL (ref 2.8–30.0)

## 2014-07-13 MED ORDER — MOMETASONE FURO-FORMOTEROL FUM 200-5 MCG/ACT IN AERO
2.0000 | INHALATION_SPRAY | Freq: Two times a day (BID) | RESPIRATORY_TRACT | Status: DC
  Administered 2014-07-14: 2 via RESPIRATORY_TRACT
  Filled 2014-07-13: qty 8.8

## 2014-07-13 MED ORDER — HYDROCHLOROTHIAZIDE 25 MG PO TABS
25.0000 mg | ORAL_TABLET | Freq: Every day | ORAL | Status: DC
  Administered 2014-07-14: 25 mg via ORAL
  Filled 2014-07-13: qty 1

## 2014-07-13 MED ORDER — TIOTROPIUM BROMIDE MONOHYDRATE 18 MCG IN CAPS
18.0000 ug | ORAL_CAPSULE | Freq: Every day | RESPIRATORY_TRACT | Status: DC
  Filled 2014-07-13: qty 5

## 2014-07-13 MED ORDER — ALBUTEROL SULFATE (2.5 MG/3ML) 0.083% IN NEBU: 2.5000 mg | INHALATION_SOLUTION | Freq: Four times a day (QID) | RESPIRATORY_TRACT | Status: DC | PRN

## 2014-07-13 MED ORDER — ALBUTEROL SULFATE HFA 108 (90 BASE) MCG/ACT IN AERS: 2.0000 | INHALATION_SPRAY | Freq: Four times a day (QID) | RESPIRATORY_TRACT | Status: DC | PRN

## 2014-07-13 MED ORDER — PANTOPRAZOLE SODIUM 40 MG PO TBEC
40.0000 mg | DELAYED_RELEASE_TABLET | Freq: Every day | ORAL | Status: DC
  Administered 2014-07-14: 40 mg via ORAL
  Filled 2014-07-13: qty 1

## 2014-07-13 NOTE — ED Provider Notes (Signed)
CSN: [id number]     Arrival date & time 07/13/14  1715 History   First MD Initiated Contact with Patient 07/13/14 1728     Chief Complaint  Patient presents with  . Medical Clearance     (Consider location/radiation/quality/duration/timing/severity/associated sxs/prior Treatment) Patient is a 54 y.o. female presenting with mental health disorder. The history is provided by the patient.  Mental Health Problem Presenting symptoms: suicidal thoughts   Degree of incapacity (severity):  Moderate Onset quality:  Gradual Timing:  Constant Progression:  Unchanged Chronicity:  New Context: stressful life event   Context: not noncompliant   Relieved by:  Nothing Ineffective treatments:  None tried Associated symptoms: no abdominal pain     Past Medical History  Diagnosis Date  . Tobacco abuse   . TIA (transient ischemic attack)     Once  . Hypertension   . Hypercholesterolemia   . COPD (chronic obstructive pulmonary disease)    Past Surgical History  Procedure Laterality Date  . Btl    . Cholecystectomy    . Tubal ligation    . Colonoscopy N/A 06/10/2014    Procedure: COLONOSCOPY;  Surgeon: Daneil Dolin, MD;  Location: [facility];  Service: Endoscopy;  Laterality: N/A;  1315  . Esophagogastroduodenoscopy N/A 06/10/2014    Procedure: ESOPHAGOGASTRODUODENOSCOPY (EGD);  Surgeon: Daneil Dolin, MD;  Location: [facility];  Service: Endoscopy;  Laterality: N/A;  Venia Minks dilation N/A 06/10/2014    Procedure: Venia Minks DILATION;  Surgeon: Daneil Dolin, MD;  Location: [facility];  Service: Endoscopy;  Laterality: N/A;  . Ectopic pregnancy surgery     Family History  Problem Relation Age of Onset  . Aneurysm Mother     brain, cause of death  . Colon cancer Neg Hx   . Cancer Brother     Dies of cancer, unsure what primary site  . Prostate cancer Brother    History  Substance Use Topics  . Smoking status: Current Every Day Smoker -- 0.30 packs/day    Types: Cigarettes  .  Smokeless tobacco: Never Used  . Alcohol Use: No     Comment: Rarely, none in the past year (as of 05/23/14)   OB History    No data available     Review of Systems  Constitutional: Negative for fever.  Respiratory: Negative for cough and shortness of breath.   Gastrointestinal: Negative for vomiting and abdominal pain.  Psychiatric/Behavioral: Positive for suicidal ideas.  All other systems reviewed and are negative.     Allergies  Mushroom extract complex and Pravastatin sodium  Home Medications   Prior to Admission medications   Medication Sig Start Date End Date Taking? Authorizing Provider  albuterol (PROVENTIL) (2.5 MG/3ML) 0.083% nebulizer solution Take 2.5 mg by nebulization every 6 (six) hours as needed for wheezing or shortness of breath.    Historical Provider, MD  albuterol (VENTOLIN HFA) 108 (90 BASE) MCG/ACT inhaler Inhale 2 puffs into the lungs every 6 (six) hours as needed for wheezing. Up to every 4-6 hours. 01/18/14   [name], MD  cyclobenzaprine (FLEXERIL) 5 MG tablet Take 1 tablet (5 mg total) by mouth 3 (three) times daily as needed for muscle spasms. 04/08/14   Katheren Shams, DO  hydrochlorothiazide (HYDRODIURIL) 25 MG tablet Take 1 tablet (25 mg total) by mouth daily. 02/27/14   [name], MD  mometasone-formoterol (DULERA) 200-5 MCG/ACT AERO Inhale 2 puffs into the lungs 2 (two) times daily. 02/08/14  Zenia Resides, MD  tiotropium (SPIRIVA HANDIHALER) 18 MCG inhalation capsule Place 1 capsule (18 mcg total) into inhaler and inhale daily. 02/26/14   Zenia Resides, MD   BP 141/82 mmHg  Pulse 94  Temp(Src) 98.5 F (36.9 C) (Oral)  Resp 18  SpO2 95%  LMP 12/27/2011 Physical Exam  Constitutional: She is oriented to person, place, and time. She appears well-developed and well-nourished. No distress.  HENT:  Head: Normocephalic and atraumatic.  Mouth/Throat: Oropharynx is clear and moist.  Eyes: EOM are normal. Pupils are equal, round, and  reactive to light.  Neck: Normal range of motion. Neck supple.  Cardiovascular: Normal rate and regular rhythm.  Exam reveals no friction rub.   No murmur heard. Pulmonary/Chest: Effort normal and breath sounds normal. No respiratory distress. She has no wheezes. She has no rales.  Abdominal: Soft. She exhibits no distension. There is no tenderness. There is no rebound.  Musculoskeletal: Normal range of motion. She exhibits no edema.  Neurological: She is alert and oriented to person, place, and time.  Skin: No rash noted. She is not diaphoretic.  Nursing note and vitals reviewed.   ED Course  Procedures (including critical care time) Labs Review Labs Reviewed  CBC - Abnormal; Notable for the following:    Hemoglobin 15.7 (*)    HCT 46.2 (*)    All other components within normal limits  ACETAMINOPHEN LEVEL  COMPREHENSIVE METABOLIC PANEL  ETHANOL  SALICYLATE LEVEL  URINE RAPID DRUG SCREEN, HOSP PERFORMED    Imaging Review No results found.   EKG Interpretation None      MDM   Final diagnoses:  Suicidal ideation    54 year old female here with SI. States that she's been this way for a few days. No history of suicidal ideation. She is not homicidal. She has a plan to take all of her meds and go to sleep. She feels it secondary to financial, family, and many other sources. Will plan for psych evaluation. Psych will re-eval in the morning.   Evelina Bucy, MD 07/13/14 [phone]

## 2014-07-13 NOTE — ED Notes (Signed)
Bed: WA29 Expected date:  Expected time:  Means of arrival:  Comments: 

## 2014-07-13 NOTE — BH Assessment (Signed)
Assessment Note  Betty Chapman is an 54 y.o. female who reports earlier this afternoon of having a suicidal plan to take all of her prescription medications.  She reports calling her Daughter and asking her to take her to [facility].  The Patient presented orientated x4, mood "depressed and feeling nervous", affect tearful, anxious, and depressed.  The Patient denied current SI, however reports experiencing SI with a plan earlier this afternoon and during the earlier part of this admission.  She denied a history of SI plans or attempts.  Patient denied current or past HI or AVH.  The Patient reports in the past year her Brother died, she lost her job and has been experiencing back pain and COPD, separated from her Spouse, unable to work and has no money, transportation, and Scientist, product/process development, experiencing discord with Spouse, adult children, and Cousin she is currently residing with.  The Patient reports experiencing crying episodes daily, able to sleep only 4 hours per night, today poor appetite and vomiting from feeling nervous.  Patient reports last consuming alcohol and Cannabis 2 months ago when she had money.  She denied current substance use.  The Patient denied a history of mental health or substance use inpatient or outpatient treatment.  The Patient will be reevaluated by psychiatry in the morning.       Axis I: Depressive Disorder NOS Axis II: Deferred Axis IV: economic problems, housing problems, occupational problems, problems related to social environment, problems with access to health care services and problems with primary support group Axis V: 41-50 serious symptoms  Past Medical History:  Past Medical History  Diagnosis Date  . Tobacco abuse   . TIA (transient ischemic attack)     Once  . Hypertension   . Hypercholesterolemia   . COPD (chronic obstructive pulmonary disease)     Past Surgical History  Procedure Laterality Date  . Btl    . Cholecystectomy    . Tubal ligation     . Colonoscopy N/A 06/10/2014    Procedure: COLONOSCOPY;  Surgeon: Daneil Dolin, MD;  [facility];  Service: Endoscopy;  Laterality: N/A;  1315  . Esophagogastroduodenoscopy N/A 06/10/2014    Procedure: ESOPHAGOGASTRODUODENOSCOPY (EGD);  Surgeon: Daneil Dolin, MD;  [facility];  Service: Endoscopy;  Laterality: N/A;  Venia Minks dilation N/A 06/10/2014    Procedure: Venia Minks DILATION;  Surgeon: Daneil Dolin, MD;  [facility];  Service: Endoscopy;  Laterality: N/A;  . Ectopic pregnancy surgery      Family History:  Family History  Problem Relation Age of Onset  . Aneurysm Mother     brain, cause of death  . Colon cancer Neg Hx   . Cancer Brother     Dies of cancer, unsure what primary site  . Prostate cancer Brother     Social History:  reports that she has been smoking Cigarettes.  She has been smoking about 0.30 packs per day. She has never used smokeless tobacco. She reports that she uses illicit drugs (Marijuana). She reports that she does not drink alcohol.  Additional Social History:     CIWA: CIWA-Ar BP: 141/82 mmHg Pulse Rate: 94 COWS:    Allergies:  Allergies  Allergen Reactions  . Mushroom Extract Complex Anaphylaxis and Rash  . Pravastatin Sodium Hives and Itching    Home Medications:  (Not in a hospital admission)  OB/GYN Status:  Patient's last menstrual period was 12/27/2011.  General Assessment Data Location of Assessment:  [facility] ED TTS Assessment: In system Is this a Tele or Face-to-Face Assessment?: Face-to-Face Is this an Initial Assessment or a Re-assessment for this encounter?: Initial Assessment Marital status: Separated (Not living with Spouse) Elwin Sleight name: Betty Chapman Is patient pregnant?: No Pregnancy Status: No Living Arrangements: Other relatives (Living with Cousin) Can pt return to current living arrangement?: Yes Admission Status: Voluntary Is patient capable of signing voluntary admission?: Yes Referral  Source: Self/Family/Friend Insurance type: None  Medical Screening Exam [facility] Walk-in ONLY) Medical Exam completed: Yes  Crisis Care Plan Living Arrangements: Other relatives (Living with Cousin) Name of Psychiatrist: None Name of Therapist: None  Education Status Is patient currently in school?: No Current Grade: N/A Highest grade of school patient has completed: 12th Name of school: N/A Contact person: N/A  Risk to self with the past 6 months Suicidal Ideation: No-Not Currently/Within Last 6 Months Has patient been a risk to self within the past 6 months prior to admission? : No Suicidal Intent: No-Not Currently/Within Last 6 Months Has patient had any suicidal intent within the past 6 months prior to admission? : Yes Is patient at risk for suicide?: No Suicidal Plan?: No-Not Currently/Within Last 6 Months Has patient had any suicidal plan within the past 6 months prior to admission? : Yes Access to Means: No What has been your use of drugs/alcohol within the last 12 months?: Patient reports ETOH and THC when she had money 2 mos ago Previous Attempts/Gestures: No How many times?: 0 Other Self Harm Risks: None Triggers for Past Attempts: None known Intentional Self Injurious Behavior: None Family Suicide History: Unknown Recent stressful life event(s): Job Loss, Financial Problems, Recent negative physical changes, Conflict (Comment), Loss (Comment) (with Spouse, adult children, and Cousin, Brother died) Persecutory voices/beliefs?: No Depression: Yes Depression Symptoms: Insomnia, Tearfulness, Loss of interest in usual pleasures, Despondent, Feeling worthless/self pity Substance abuse history and/or treatment for substance abuse?: Yes (Patient reports previous ETOH and THC use.) Suicide prevention information given to non-admitted patients: Not applicable  Risk to Others within the past 6 months Homicidal Ideation: No Does patient have any lifetime risk of violence toward  others beyond the six months prior to admission? : No Thoughts of Harm to Others: No Current Homicidal Intent: No Current Homicidal Plan: No Access to Homicidal Means: No (Patient denied possession or access to gun) Identified Victim: N/A History of harm to others?: No Assessment of Violence: None Noted Violent Behavior Description: None Does patient have access to weapons?: No Criminal Charges Pending?: No Does patient have a court date: No Is patient on probation?: No  Psychosis Hallucinations: None noted Delusions: None noted  Mental Status Report Appearance/Hygiene: In hospital gown Eye Contact: Fair Motor Activity: Restlessness, Agitation Speech: Pressured Level of Consciousness: Alert Mood: Depressed, Sad, Helpless, Other (Comment) (Hopeless) Affect: Anxious, Depressed, Sad Anxiety Level: Moderate Thought Processes: Coherent, Relevant Judgement: Partial Orientation: Person, Place, Time, Situation Obsessive Compulsive Thoughts/Behaviors: None  Cognitive Functioning Concentration: Good Memory: Recent Intact, Remote Intact IQ: Average Insight: Fair Impulse Control: Fair Appetite: Poor Weight Loss: 0 Weight Gain: 0 Sleep: Decreased Total Hours of Sleep: 4 Vegetative Symptoms: None  ADLScreening [facility] Assessment Services) Patient's cognitive ability adequate to safely complete daily activities?: Yes Patient able to express need for assistance with ADLs?: No Independently performs ADLs?: Yes (appropriate for developmental age)  Prior Inpatient Therapy Prior Inpatient Therapy: No Prior Therapy Dates: N/A Prior Therapy Facilty/Provider(s): N/A Reason for Treatment: N/A  Prior Outpatient Therapy Prior Outpatient Therapy: No Prior Therapy Dates: N/A  Prior Therapy Facilty/Provider(s): N/A Reason for Treatment: N/A Does patient have an ACCT team?: No Does patient have Intensive In-House Services?  : No Does patient have [facility] services? : No Does patient have  P4CC services?: No  ADL Screening (condition at time of admission) Patient's cognitive ability adequate to safely complete daily activities?: Yes Is the patient deaf or have difficulty hearing?: No Does the patient have difficulty seeing, even when wearing glasses/contacts?: No Does the patient have difficulty concentrating, remembering, or making decisions?: No Patient able to express need for assistance with ADLs?: No Does the patient have difficulty dressing or bathing?: No Independently performs ADLs?: Yes (appropriate for developmental age) Does the patient have difficulty walking or climbing stairs?: No Weakness of Legs: None Weakness of Arms/Hands: None  Home Assistive Devices/Equipment Home Assistive Devices/Equipment: None    Abuse/Neglect Assessment (Assessment to be complete while patient is alone) Physical Abuse: Denies Verbal Abuse: Denies Sexual Abuse: Denies Exploitation of patient/patient's resources: Denies Self-Neglect: Denies Values / Beliefs Cultural Requests During Hospitalization: None Spiritual Requests During Hospitalization: None   Advance Directives (For Healthcare) Does patient have an advance directive?: No    Additional Information 1:1 In Past 12 Months?: No CIRT Risk: No Elopement Risk: No Does patient have medical clearance?: Yes     Disposition:  Disposition Initial Assessment Completed for this Encounter: Yes Disposition of Patient: Referred to [facility] Assessment note.) Patient referred to: Other (Comment) (Consult with Extender)  On Site Evaluation by:   Reviewed with Physician:    Dey-Johnson,Shawon Denzer 07/13/2014 8:12 PM

## 2014-07-13 NOTE — ED Notes (Signed)
Pt's daughter called and wished to be advised as to whether her mother had been seen and what the plan was.  Daughter advised that pt would see psychiatry team in the am.

## 2014-07-13 NOTE — ED Notes (Signed)
Pt reports SI for "quite a few days." No specific plans. Sts no Hx of same. Not on any psych meds, denies psych Hx. Denies HI.

## 2014-07-13 NOTE — BH Assessment (Signed)
1930:  Consult with Dr. Mingo Amber.  Reports Patient is suicidal.  1931:  Start assessment  1953:  End assessment  2025:  Consulted with Extender Serena Colonel, NP: Per [employer];  Patient will be reevaluated in the morning.  2028:  Provided Dr. Mingo Amber with Patient's disposition.

## 2014-07-14 ENCOUNTER — Encounter (HOSPITAL_COMMUNITY): Payer: Self-pay | Admitting: Emergency Medicine

## 2014-07-14 DIAGNOSIS — R45851 Suicidal ideations: Secondary | ICD-10-CM | POA: Insufficient documentation

## 2014-07-14 DIAGNOSIS — F39 Unspecified mood [affective] disorder: Secondary | ICD-10-CM

## 2014-07-14 NOTE — Consult Note (Signed)
[facility] Consult   Reason for Consult:  Unspecified mood disorder Referring Physician:  EDP Patient Identification: Betty Chapman MRN:  [id number] Principal Diagnosis: Mood disorder Diagnosis:   Patient Active Problem List   Diagnosis Date Noted  . Mood disorder [F39] 07/14/2014    Priority: High  . Reflux esophagitis [K21.0]   . Hiatal hernia [K44.9]   . Schatzki's ring [Q39.4]   . Dysphagia, pharyngoesophageal phase [R13.14]   . Encounter for screening colonoscopy [Z12.11] 05/23/2014  . Hives [L50.9] 04/18/2014  . Acute bronchitis [J20.9] 04/08/2014  . Dyspnea [R06.00] 03/20/2014  . Blurry vision, bilateral [H53.8] 03/20/2014  . Encounter for routine gynecological examination [Z01.419] 02/08/2014  . Candidal intertrigo [B37.2] 01/18/2014  . Dysphagia [R13.10] 01/18/2014  . Health care maintenance [Z00.00] 01/18/2014  . Dyspepsia [R10.13] 07/05/2012  . Tenesmus [R19.8] 07/05/2012  . COPD (chronic obstructive pulmonary disease) [J44.9] 02/08/2012  . IRREGULAR MENSES [N92.6] 07/02/2009  . Obesity [E66.9] 06/04/2008  . TRANSIENT ISCHEMIC ATTACK [G45.9] 06/04/2008  . TOBACCO ABUSE [Z72.0] 03/01/2008  . Hypertension [I10] 01/30/2008    Total Time spent with patient: 1 hour  Subjective:   Betty Chapman is a 54 y.o. female patient admitted with UNSPECIFIED MOOD DISORDER.  HPI: Caucasian female, 54 years old was evaluated for suicidal ideation and feelings of depression.  Patient reported feeling and thinking about suicide once yesterday.  She denied suicide attempt or suicide thought previously.   Patient reports that she is homeless and that she stays with a family member who puts too much stress on her telling her all the time that they all are going to be homeless soon.  Patient is separated from her husband and willing to get back with him but he is not interested in getting back together.  Patient reports poor sleep and appetite and states that she  wakes up crying, feeling hopeless and helpless.  Patient is unemployed and stated that she stopped working to tace of a sick brother and was not able to get back to work after her husband died.  Patient reports that her children are grown and that they do not care about her.   Patient does not and have never seen a Psychiatrist and does not take any MH medications.  Patient denies SI/HI/AVH and her deterrence for not killing herself are her children and grand children.   Patient is discharged home with information for outpatient [place] providers services.  HPI Elements:   Location:  Unspecified mood disorder, . Quality:  Mederate-severe. Severity:  Moderate -severe. Timing:  acute. Duration:  Sudden. Context:  Seeking treatment for sudden episode of suicidal thought..  Past Medical History:  Past Medical History  Diagnosis Date  . Tobacco abuse   . TIA (transient ischemic attack)     Once  . Hypertension   . Hypercholesterolemia   . COPD (chronic obstructive pulmonary disease)     Past Surgical History  Procedure Laterality Date  . Btl    . Cholecystectomy    . Tubal ligation    . Colonoscopy N/A 06/10/2014    Procedure: COLONOSCOPY;  Surgeon: Daneil Dolin, MD;  [facility];  Service: Endoscopy;  Laterality: N/A;  1315  . Esophagogastroduodenoscopy N/A 06/10/2014    Procedure: ESOPHAGOGASTRODUODENOSCOPY (EGD);  Surgeon: Daneil Dolin, MD;  [facility];  Service: Endoscopy;  Laterality: N/A;  Venia Minks dilation N/A 06/10/2014    Procedure: Venia Minks DILATION;  Surgeon: Daneil Dolin, MD;  [facility];  Service: Endoscopy;  Laterality: N/A;  . Ectopic pregnancy surgery     Family History:  Family History  Problem Relation Age of Onset  . Aneurysm Mother     brain, cause of death  . Colon cancer Neg Hx   . Cancer Brother     Dies of cancer, unsure what primary site  . Prostate cancer Brother    Social History:  History  Alcohol Use No    Comment:  Rarely, none in the past year (as of 05/23/14)     History  Drug Use  . Yes  . Special: Marijuana    Comment: Occasionally    History   Social History  . Marital Status: Married    Spouse Name: N/A  . Number of Children: N/A  . Years of Education: N/A   Social History Main Topics  . Smoking status: Current Every Day Smoker -- 0.30 packs/day    Types: Cigarettes  . Smokeless tobacco: Never Used  . Alcohol Use: No     Comment: Rarely, none in the past year (as of 05/23/14)  . Drug Use: Yes    Special: Marijuana     Comment: Occasionally  . Sexual Activity: Yes    Birth Control/ Protection: Surgical   Other Topics Concern  . None   Social History Narrative   Additional Social History:                          Allergies:   Allergies  Allergen Reactions  . Mushroom Extract Complex Anaphylaxis and Rash  . Pravastatin Sodium Hives and Itching    Labs:  Results for orders placed or performed during the hospital encounter of 07/13/14 (from the past 48 hour(s))  Acetaminophen level     Status: Abnormal   Collection Time: 07/13/14  6:20 PM  Result Value Ref Range   Acetaminophen (Tylenol), Serum <10 (L) 10 - 30 ug/mL    Comment:        THERAPEUTIC CONCENTRATIONS VARY SIGNIFICANTLY. A RANGE OF 10-30 ug/mL MAY BE AN EFFECTIVE CONCENTRATION FOR MANY PATIENTS. HOWEVER, SOME ARE BEST TREATED AT CONCENTRATIONS OUTSIDE THIS RANGE. ACETAMINOPHEN CONCENTRATIONS >150 ug/mL AT 4 HOURS AFTER INGESTION AND >50 ug/mL AT 12 HOURS AFTER INGESTION ARE OFTEN ASSOCIATED WITH TOXIC REACTIONS.   CBC     Status: Abnormal   Collection Time: 07/13/14  6:20 PM  Result Value Ref Range   WBC 9.3 4.0 - 10.5 K/uL   RBC 4.95 3.87 - 5.11 MIL/uL   Hemoglobin 15.7 (H) 12.0 - 15.0 g/dL   HCT 46.2 (H) 36.0 - 46.0 %   MCV 93.3 78.0 - 100.0 fL   MCH 31.7 26.0 - 34.0 pg   MCHC 34.0 30.0 - 36.0 g/dL   RDW 13.7 11.5 - 15.5 %   Platelets 314 150 - 400 K/uL  Comprehensive metabolic  panel     Status: Abnormal   Collection Time: 07/13/14  6:20 PM  Result Value Ref Range   Sodium 142 135 - 145 mmol/L   Potassium 3.9 3.5 - 5.1 mmol/L   Chloride 110 101 - 111 mmol/L   CO2 22 22 - 32 mmol/L   Glucose, Bld 124 (H) 65 - 99 mg/dL   BUN 14 6 - 20 mg/dL   Creatinine, Ser 0.56 0.44 - 1.00 mg/dL   Calcium 9.2 8.9 - 10.3 mg/dL   Total Protein 7.3 6.5 - 8.1 g/dL   Albumin 4.1 3.5 -  5.0 g/dL   AST 19 15 - 41 U/L   ALT 17 14 - 54 U/L   Alkaline Phosphatase 107 38 - 126 U/L   Total Bilirubin 0.5 0.3 - 1.2 mg/dL   GFR calc non Af Amer >60 >60 mL/min   GFR calc Af Amer >60 >60 mL/min    Comment: (NOTE) The eGFR has been calculated using the CKD EPI equation. This calculation has not been validated in all clinical situations. eGFR's persistently <60 mL/min signify possible Chronic Kidney Disease.    Anion gap 10 5 - 15  Ethanol (ETOH)     Status: None   Collection Time: 07/13/14  6:20 PM  Result Value Ref Range   Alcohol, Ethyl (B) <5 <5 mg/dL    Comment:        LOWEST DETECTABLE LIMIT FOR SERUM ALCOHOL IS 5 mg/dL FOR MEDICAL PURPOSES ONLY   Salicylate level     Status: None   Collection Time: 07/13/14  6:20 PM  Result Value Ref Range   Salicylate Lvl <6.1 2.8 - 30.0 mg/dL  Urine rapid drug screen (hosp performed)     Status: Abnormal   Collection Time: 07/13/14  8:03 PM  Result Value Ref Range   Opiates NONE DETECTED NONE DETECTED   Cocaine NONE DETECTED NONE DETECTED   Benzodiazepines NONE DETECTED NONE DETECTED   Amphetamines NONE DETECTED NONE DETECTED   Tetrahydrocannabinol POSITIVE (A) NONE DETECTED   Barbiturates NONE DETECTED NONE DETECTED    Comment:        DRUG SCREEN FOR MEDICAL PURPOSES ONLY.  IF CONFIRMATION IS NEEDED FOR ANY PURPOSE, NOTIFY LAB WITHIN 5 DAYS.        LOWEST DETECTABLE LIMITS FOR URINE DRUG SCREEN Drug Class       Cutoff (ng/mL) Amphetamine      1000 Barbiturate      200 Benzodiazepine   607 Tricyclics       371 Opiates           300 Cocaine          300 THC              50     Vitals: Blood pressure 125/72, pulse 70, temperature 98.7 F (37.1 C), temperature source Oral, resp. rate 20, last menstrual period 12/27/2011, SpO2 96 %.  Risk to Self: Suicidal Ideation: No-Not Currently/Within Last 6 Months Suicidal Intent: No-Not Currently/Within Last 6 Months Is patient at risk for suicide?: No Suicidal Plan?: No-Not Currently/Within Last 6 Months Access to Means: No What has been your use of drugs/alcohol within the last 12 months?: Patient reports ETOH and THC when she had money 2 mos ago How many times?: 0 Other Self Harm Risks: None Triggers for Past Attempts: None known Intentional Self Injurious Behavior: None Risk to Others: Homicidal Ideation: No Thoughts of Harm to Others: No Current Homicidal Intent: No Current Homicidal Plan: No Access to Homicidal Means: No (Patient denied possession or access to gun) Identified Victim: N/A History of harm to others?: No Assessment of Violence: None Noted Violent Behavior Description: None Does patient have access to weapons?: No Criminal Charges Pending?: No Does patient have a court date: No Prior Inpatient Therapy: Prior Inpatient Therapy: No Prior Therapy Dates: N/A Prior Therapy Facilty/Provider(s): N/A Reason for Treatment: N/A Prior Outpatient Therapy: Prior Outpatient Therapy: No Prior Therapy Dates: N/A Prior Therapy Facilty/Provider(s): N/A Reason for Treatment: N/A Does patient have an ACCT team?: No Does patient have Intensive In-House Services?  : No  Does patient have [facility] services? : No Does patient have P4CC services?: No  Current Facility-Administered Medications  Medication Dose Route Frequency Provider Last Rate Last Dose  . albuterol (PROVENTIL HFA;VENTOLIN HFA) 108 (90 BASE) MCG/ACT inhaler 2 puff  2 puff Inhalation Q6H PRN Evelina Bucy, MD      . albuterol (PROVENTIL) (2.5 MG/3ML) 0.083% nebulizer solution 2.5 mg  2.5 mg  Nebulization Q6H PRN Evelina Bucy, MD      . hydrochlorothiazide (HYDRODIURIL) tablet 25 mg  25 mg Oral Daily Evelina Bucy, MD   25 mg at 07/14/14 1015  . mometasone-formoterol (DULERA) 200-5 MCG/ACT inhaler 2 puff  2 puff Inhalation BID Evelina Bucy, MD   Stopped at 07/14/14 1017  . pantoprazole (PROTONIX) EC tablet 40 mg  40 mg Oral Daily Evelina Bucy, MD   40 mg at 07/14/14 1015  . tiotropium (SPIRIVA) inhalation capsule 18 mcg  18 mcg Inhalation Daily Evelina Bucy, MD   Stopped at 07/14/14 1017   Current Outpatient Prescriptions  Medication Sig Dispense Refill  . albuterol (PROVENTIL) (2.5 MG/3ML) 0.083% nebulizer solution Take 2.5 mg by nebulization every 6 (six) hours as needed for wheezing or shortness of breath.    [name] albuterol (VENTOLIN HFA) 108 (90 BASE) MCG/ACT inhaler Inhale 2 puffs into the lungs every 6 (six) hours as needed for wheezing. Up to every 4-6 hours. 18 g 5  . cyclobenzaprine (FLEXERIL) 5 MG tablet Take 1 tablet (5 mg total) by mouth 3 (three) times daily as needed for muscle spasms. 30 tablet 0  . hydrochlorothiazide (HYDRODIURIL) 25 MG tablet Take 1 tablet (25 mg total) by mouth daily. 90 tablet 1  . mometasone-formoterol (DULERA) 200-5 MCG/ACT AERO Inhale 2 puffs into the lungs 2 (two) times daily. 8.8 g 0  . pantoprazole (PROTONIX) 40 MG tablet Take 40 mg by mouth daily.    [name] tiotropium (SPIRIVA HANDIHALER) 18 MCG inhalation capsule Place 1 capsule (18 mcg total) into inhaler and inhale daily. 30 capsule 12    Musculoskeletal: Strength & Muscle Tone: within normal limits Gait & Station: normal Patient leans: N/A  Psychiatric Specialty Exam: Physical Exam  Review of Systems  Constitutional: Negative.   HENT: Negative.   Eyes: Negative.   Respiratory: Negative.   Cardiovascular: Negative.   Gastrointestinal: Negative.   Genitourinary: Negative.   Musculoskeletal: Negative.   Skin: Negative.   Neurological: Negative.   Endo/Heme/Allergies: Negative.      Blood pressure 125/72, pulse 70, temperature 98.7 F (37.1 C), temperature source Oral, resp. rate 20, last menstrual period 12/27/2011, SpO2 96 %.There is no weight on file to calculate BMI.  General Appearance: Casual  Eye Contact::  Good  Speech:  Clear and Coherent and Normal Rate  Volume:  Normal  Mood:  Euthymic  Affect:  Congruent  Thought Process:  Coherent, Goal Directed and Intact  Orientation:  Full (Time, Place, and Person)  Thought Content:  WDL  Suicidal Thoughts:  No  Homicidal Thoughts:  No  Memory:  Immediate;   Good Recent;   Good Remote;   Good  Judgement:  Good  Insight:  Good  Psychomotor Activity:  Normal  Concentration:  Good  Recall:  NA  Fund of Knowledge:Good  Language: Good  Akathisia:  NA  Handed:  Right  AIMS (if indicated):     Assets:  Desire for Improvement  ADL's:  Intact  Cognition: WNL  Sleep:      Medical Decision Making: Established Problem, Stable/Improving (1)    Disposition:  Discharge home  Delfin Gant   PMHNP-BC  07/14/2014 2:12 PM   Patient seen and I agree with treatment and plan  Levonne Spiller M.D.

## 2014-07-14 NOTE — ED Notes (Signed)
Pt is awake and has requested to walk some to stretch her leg.  Sitter accompanied the patient as she walked the unit.  Then she returned to her room and bed.

## 2014-07-14 NOTE — ED Notes (Signed)
Pt upset that sitter had to watch her have a bowel movement,  Pt told that it was policy of health system for the patients safety to do so.

## 2014-07-14 NOTE — [facility] Suicide Risk Assessment (Cosign Needed)
Suicide Risk Assessment  Discharge Assessment   [facility] Discharge Suicide Risk Assessment   Demographic Factors:  Caucasian, Low socioeconomic status and Unemployed  Total Time spent with patient: 20 minutes  Musculoskeletal: Strength & Muscle Tone: within normal limits Gait & Station: normal Patient leans: N/A  Psychiatric Specialty Exam:     Blood pressure 125/72, pulse 70, temperature 98.7 F (37.1 C), temperature source Oral, resp. rate 20, last menstrual period 12/27/2011, SpO2 96 %.There is no weight on file to calculate BMI.  General Appearance: Casual  Eye Contact::  Good  Speech:  Clear and Coherent and Normal Rate  Volume:  Normal  Mood:  Euthymic  Affect:  Congruent  Thought Process:  Coherent, Goal Directed and Intact  Orientation:  Full (Time, Place, and Person)  Thought Content:  WDL  Suicidal Thoughts:  No  Homicidal Thoughts:  No  Memory:  Immediate;   Good Recent;   Good Remote;   Good  Judgement:  Good  Insight:  Good  Psychomotor Activity:  Normal  Concentration:  Good  Recall:  NA  Fund of Knowledge:Good  Language: Good  Akathisia:  NA  Handed:  Right  AIMS (if indicated):     Assets:  Desire for Improvement  ADL's:  Intact  Cognition: WNL        Has this patient used any form of tobacco in the last 30 days? (Cigarettes, Smokeless Tobacco, Cigars, and/or Pipes) No  Mental Status Per Nursing Assessment::   On Admission:     Current Mental Status by Physician: NA  Loss Factors: NA  Historical Factors: NA  Risk Reduction Factors:   Sense of responsibility to family, Religious beliefs about death and Living with another person, especially a relative  Continued Clinical Symptoms:  Unstable or Poor Therapeutic Relationship  Cognitive Features That Contribute To Risk:  Polarized thinking    Suicide Risk:  Minimal: No identifiable suicidal ideation.  Patients presenting with no risk factors but with morbid ruminations; may be  classified as minimal risk based on the severity of the depressive symptoms  Principal Problem: Mood disorder Discharge Diagnoses:  Patient Active Problem List   Diagnosis Date Noted  . Mood disorder [F39] 07/14/2014    Priority: High  . Suicidal ideation [R45.851]   . Reflux esophagitis [K21.0]   . Hiatal hernia [K44.9]   . Schatzki's ring [Q39.4]   . Dysphagia, pharyngoesophageal phase [R13.14]   . Encounter for screening colonoscopy [Z12.11] 05/23/2014  . Hives [L50.9] 04/18/2014  . Acute bronchitis [J20.9] 04/08/2014  . Dyspnea [R06.00] 03/20/2014  . Blurry vision, bilateral [H53.8] 03/20/2014  . Encounter for routine gynecological examination [Z01.419] 02/08/2014  . Candidal intertrigo [B37.2] 01/18/2014  . Dysphagia [R13.10] 01/18/2014  . Health care maintenance [Z00.00] 01/18/2014  . Dyspepsia [R10.13] 07/05/2012  . Tenesmus [R19.8] 07/05/2012  . COPD (chronic obstructive pulmonary disease) [J44.9] 02/08/2012  . IRREGULAR MENSES [N92.6] 07/02/2009  . Obesity [E66.9] 06/04/2008  . TRANSIENT ISCHEMIC ATTACK [G45.9] 06/04/2008  . TOBACCO ABUSE [Z72.0] 03/01/2008  . Hypertension [I10] 01/30/2008      Plan Of Care/Follow-up recommendations:  Activity:  as tolearted  Diet:  regular  Is patient on multiple antipsychotic therapies at discharge:  No   Has Patient had three or more failed trials of antipsychotic monotherapy by history:  No  Recommended Plan for Multiple Antipsychotic Therapies: NA    Kanon Novosel C    PMHNP-BC 07/14/2014, 2:32 PM

## 2014-07-16 ENCOUNTER — Ambulatory Visit (INDEPENDENT_AMBULATORY_CARE_PROVIDER_SITE_OTHER): Payer: Self-pay | Admitting: Family Medicine

## 2014-07-16 ENCOUNTER — Encounter: Payer: Self-pay | Admitting: Family Medicine

## 2014-07-16 VITALS — BP 130/69 | HR 84 | Temp 98.5°F | Ht 60.0 in | Wt 159.4 lb

## 2014-07-16 DIAGNOSIS — R45851 Suicidal ideations: Secondary | ICD-10-CM

## 2014-07-16 DIAGNOSIS — L84 Corns and callosities: Secondary | ICD-10-CM

## 2014-07-16 DIAGNOSIS — E785 Hyperlipidemia, unspecified: Secondary | ICD-10-CM

## 2014-07-16 DIAGNOSIS — J449 Chronic obstructive pulmonary disease, unspecified: Secondary | ICD-10-CM

## 2014-07-16 DIAGNOSIS — L720 Epidermal cyst: Secondary | ICD-10-CM

## 2014-07-16 DIAGNOSIS — I10 Essential (primary) hypertension: Secondary | ICD-10-CM

## 2014-07-16 DIAGNOSIS — E119 Type 2 diabetes mellitus without complications: Secondary | ICD-10-CM

## 2014-07-16 DIAGNOSIS — L989 Disorder of the skin and subcutaneous tissue, unspecified: Secondary | ICD-10-CM

## 2014-07-16 HISTORY — DX: Epidermal cyst: L72.0

## 2014-07-16 HISTORY — DX: Corns and callosities: L84

## 2014-07-16 LAB — POCT GLYCOSYLATED HEMOGLOBIN (HGB A1C): Hemoglobin A1C: 6.6

## 2014-07-16 MED ORDER — EZETIMIBE 10 MG PO TABS: 10.0000 mg | ORAL_TABLET | Freq: Every day | ORAL | Status: DC

## 2014-07-16 MED ORDER — FLUOXETINE HCL 20 MG PO TABS: 20.0000 mg | ORAL_TABLET | Freq: Every day | ORAL | Status: DC

## 2014-07-16 MED ORDER — METFORMIN HCL 500 MG PO TABS: 500.0000 mg | ORAL_TABLET | Freq: Two times a day (BID) | ORAL | Status: DC

## 2014-07-16 NOTE — Patient Instructions (Addendum)
It was nice seeing you and I am happy you don't have suicidal ideation anymore. I have given you instruction on how to care for yourself when having this feeling. I will like to start you on medication for depression. Also contact [facility] soon to see a psychiatrist. Their  phone number and address is in a separate sheet given to you.     Suicidal Feelings, How to Help Yourself Everyone feels sad or unhappy at times, but depressing thoughts and feelings of hopelessness can lead to thoughts of suicide. It can seem as if life is too tough to handle. If you feel as though you have reached the point where suicide is the only answer, it is time to let someone know immediately.  HOW TO COPE AND PREVENT SUICIDE  Let family, friends, teachers, or counselors know. Get help. Try not to isolate yourself from those who care about you. Even though you may not feel sociable, talk with someone every day. It is best if it is face-to-face. Remember, they will want to help you.  Eat a regularly spaced and well-balanced diet.  Get plenty of rest.  Avoid alcohol and drugs because they will only make you feel worse and may also lower your inhibitions. Remove them from the home. If you are thinking of taking an overdose of your prescribed medicines, give your medicines to someone who can give them to you one day at a time. If you are on antidepressants, let your caregiver know of your feelings so he or she can provide a safer medicine, if that is a concern.  Remove weapons or poisons from your home.  Try to stick to routines. Follow a schedule and remind yourself that you have to keep that schedule every day.  Set some realistic goals and achieve them. Make a list and cross things off as you go. Accomplishments give a sense of worth. Wait until you are feeling better before doing things you find difficult or unpleasant to do.  If you are able, try to start exercising. Even half-hour periods of  exercise each day will make you feel better. Getting out in the sun or into nature helps you recover from depression faster. If you have a favorite place to walk, take advantage of that.  Increase safe activities that have always given you pleasure. This may include playing your favorite music, reading a good book, painting a picture, or playing your favorite instrument. Do whatever takes your mind off your depression.  Keep your living space well-lighted. GET HELP Contact a suicide hotline, crisis center, or local suicide prevention center for help right away. Local centers may include a hospital, clinic, community service organization, social service provider, or health department.  Call your local emergency services (911 in the [place]).  Call a suicide hotline:  1-800-273-TALK (1-949-698-6456) in the [place].  1-800-SUICIDE (718)814-1693) in the [place].  (878)750-3194 in the [place] for Spanish-speaking counselors.  6-387-564-3PIR (513) 497-1097) in the [place] for TTY users.  Visit the following websites for information and help:  National Suicide Prevention Lifeline: www.suicidepreventionlifeline.org  Hopeline: www.hopeline.[place] for Suicide Prevention: PromotionalLoans.co.za  For lesbian, gay, bisexual, transgender, or questioning youth, contact [employer]:  0-160-1-U-XNATFT 604-375-6910) in the [place].  www.thetrevorproject.org  In [place], treatment resources are listed in each [place] with listings available under [employer] for [employer] or similar titles. Another source for Crisis Centres by [place] is located at http://www.suicideprevention.ca/in-crisis-now/find-a-crisis-centre-now/crisis-centres Document Released: 06/27/2002  Document Revised: 03/15/2011 Document Reviewed: 04/17/2013 [facility] Patient Information 2015 [place], [place]. This information is not intended to replace advice given to you  by your health care provider. Make sure you discuss any questions you have with your health care provider.

## 2014-07-16 NOTE — Assessment & Plan Note (Addendum)
Currently asymptomatic. I counseled patient today and suggested she might benefit from taking antidepressant. I gave her the number to contact [facility] to schedule appointment. They should see her with her insurance type. I contacted Dr Harrington Challenger who supervised the clinician that saw her at [facility] long, he also agreed with starting her on antidepressant and recommended Prozac. This was discussed with patient and she agreed on starting Prozac prior to scheduling an appointment.

## 2014-07-16 NOTE — Progress Notes (Addendum)
Subjective:     Patient ID: Betty Chapman, female   DOB: [birth year], 54 y.o.   MRN: [id number]  HPI  Suicidal ideation:Patient here to follow up after hospital admission for suicidal ideation 3 days ago. She stated that was the first time she will ever have feeling of hurting herself and has not had similar feeling since discharged from the hospital. She stated she has been more depressed lately, had never been on antidepressant. She is unclear what was recommended after discharged from the hospital. HTN/HLD:She has been off her Statin due to skin reaction. She had been on Pravastatin and Lipitor with similar reaction, so she stopped all meds all together. She has been however compliant with her antihypertensive agent. DG:UYQI for follow up, she has been working on her diet and exercise. COPD:She is compliant with her meds and breathing well. Skin lesion:  C/O lesion on her forehead which has been there for years, with no change in size or color till recently when she developed a new lesion closer to the previous one. She denies any itching or pain. She is concern about what it may be. Lesion on feet: C/O hard occasionally painful lesion on her left heel and dorsum of her foot. She wanted it to be accessed.  Current Outpatient Prescriptions on File Prior to Visit  Medication Sig Dispense Refill  . albuterol (PROVENTIL) (2.5 MG/3ML) 0.083% nebulizer solution Take 2.5 mg by nebulization every 6 (six) hours as needed for wheezing or shortness of breath.    [name] albuterol (VENTOLIN HFA) 108 (90 BASE) MCG/ACT inhaler Inhale 2 puffs into the lungs every 6 (six) hours as needed for wheezing. Up to every 4-6 hours. 18 g 5  . hydrochlorothiazide (HYDRODIURIL) 25 MG tablet Take 1 tablet (25 mg total) by mouth daily. 90 tablet 1  . mometasone-formoterol (DULERA) 200-5 MCG/ACT AERO Inhale 2 puffs into the lungs 2 (two) times daily. 8.8 g 0  . pantoprazole (PROTONIX) 40 MG tablet Take 40 mg by mouth daily.      [name] tiotropium (SPIRIVA HANDIHALER) 18 MCG inhalation capsule Place 1 capsule (18 mcg total) into inhaler and inhale daily. 30 capsule 12  . cyclobenzaprine (FLEXERIL) 5 MG tablet Take 1 tablet (5 mg total) by mouth 3 (three) times daily as needed for muscle spasms. (Patient not taking: Reported on 07/16/2014) 30 tablet 0   No current facility-administered medications on file prior to visit.   Past Medical History  Diagnosis Date  . Tobacco abuse   . TIA (transient ischemic attack)     Once  . Hypertension   . Hypercholesterolemia   . COPD (chronic obstructive pulmonary disease)       Review of Systems  Constitutional: Negative for fatigue.  Eyes: Negative for visual disturbance.  Respiratory: Negative.   Cardiovascular: Negative.   Gastrointestinal: Negative.   Musculoskeletal: Negative for arthralgias.       Thick lesion on foot.  Skin:       Lesion on forehead.  Neurological: Negative for dizziness.  Psychiatric/Behavioral: Positive for confusion and decreased concentration. Negative for suicidal ideas and hallucinations. The patient is not nervous/anxious.   All other systems reviewed and are negative.      Filed Vitals:   07/16/14 0949  BP: 130/69  Pulse: 84  Temp: 98.5 F (36.9 C)  TempSrc: Oral  Height: 5' (1.524 m)  Weight: 159 lb 6 oz (72.292 kg)     Objective:   Physical Exam  Constitutional: She is oriented to person,  place, and time. She appears well-developed. No distress.  HENT:  Head: Normocephalic.  Neck: Neck supple.  Cardiovascular: Normal rate, regular rhythm and normal heart sounds.   No murmur heard. Pulses:      Dorsalis pedis pulses are 3+ on the right side, and 3+ on the left side.  Pulmonary/Chest: Effort normal and breath sounds normal. No respiratory distress. She has no wheezes.  Abdominal: Soft. Bowel sounds are normal. She exhibits no distension and no mass. There is no tenderness.  Musculoskeletal: Normal range of motion. She  exhibits no edema.       Feet:  Neurological: She is alert and oriented to person, place, and time. No cranial nerve deficit.  Skin:     Psychiatric: Her speech is normal. Judgment normal. Her mood appears not anxious. Her affect is not angry. She is not aggressive. Cognition and memory are normal. She exhibits a depressed mood. She expresses no homicidal and no suicidal ideation. She expresses no suicidal plans and no homicidal plans.  Nursing note and vitals reviewed.      Assessment:     Suicidal ideation: HTN/HLD: DM: COPD: Skin lesion    Plan:     Check problem list.

## 2014-07-16 NOTE — Assessment & Plan Note (Signed)
Allergy to statin agents. Plan to start on non-statin agent. Zetia faxed to her pharmacy.

## 2014-07-16 NOTE — Assessment & Plan Note (Signed)
BP ok today. Continue current regimen.

## 2014-07-16 NOTE — Assessment & Plan Note (Signed)
R/O BCC. Risk factor include age and currently smoking tobacco. I recommended follow up at [facility] clinic for excisional biopsy with me. To Schedule appointment today.

## 2014-07-16 NOTE — Assessment & Plan Note (Signed)
Repeat A1C still 6.6 despite diet and exercise control. I recommended starting Metformin which was faxed to her pharmacy today.

## 2014-07-16 NOTE — Assessment & Plan Note (Signed)
B/L but more on left foot. May be related to chronic irritation vs DM2. I recommended good foot care and hygiene. She will benefit from podiatry referral but uncertain they will take her insurance. I will reassess at next visit and discuss podiatry referral.

## 2014-07-16 NOTE — Assessment & Plan Note (Signed)
No acute change. Continue current regimen.

## 2014-07-17 ENCOUNTER — Other Ambulatory Visit: Payer: Self-pay | Admitting: Family Medicine

## 2014-07-17 MED ORDER — FLUOXETINE HCL 20 MG PO TABS: 20.0000 mg | ORAL_TABLET | Freq: Every day | ORAL | Status: DC

## 2014-07-17 MED ORDER — METFORMIN HCL 500 MG PO TABS: 500.0000 mg | ORAL_TABLET | Freq: Two times a day (BID) | ORAL | Status: DC

## 2014-07-17 MED ORDER — EZETIMIBE 10 MG PO TABS: 10.0000 mg | ORAL_TABLET | Freq: Every day | ORAL | Status: DC

## 2014-07-17 MED ORDER — HYDROCHLOROTHIAZIDE 25 MG PO TABS: 25.0000 mg | ORAL_TABLET | Freq: Every day | ORAL | Status: DC

## 2014-07-17 NOTE — Telephone Encounter (Signed)
I double checked and confirmed with the front office. Her script was faxed to [phone] yesterday. I have again refilled her meds through [facility] fax this time. Yesterday I called [facility] multiple times to give a telephone refill but no response from [facility]. Please advise patient to check with [facility] if there is a better number and time to reach time or if they have a different fax to what we have here.

## 2014-07-17 NOTE — Telephone Encounter (Signed)
Will forward to PCP for review. Kymere Fullington, CMA. 

## 2014-07-17 NOTE — Telephone Encounter (Signed)
I was eventually able to reach [facility], they do have the refill but they need patient to come in to sign for her prescriptions. I contacted patient about this and she stated she will be able to go tomorrow to sign for her prescriptions.

## 2014-07-17 NOTE — Telephone Encounter (Signed)
[facility] program says they have never received RX's that were sent yesterda Please advise

## 2014-07-17 NOTE — Telephone Encounter (Signed)
I placed all script up front for faxing yesterday and I believe they have been faxed. I will reprint script and place up front again.

## 2014-07-18 ENCOUNTER — Telehealth: Payer: Self-pay | Admitting: Family Medicine

## 2014-07-18 NOTE — Telephone Encounter (Signed)
Has questions regarding an antidepressant that was prescribed. She would like to discuss the questions with her PCP. Please contact patient at the earliest convenience. Thank you, Betty Chapman, ASA

## 2014-07-18 NOTE — Telephone Encounter (Signed)
Will forward to PCP for review. Harlan Vinal, CMA. 

## 2014-07-18 NOTE — Telephone Encounter (Signed)
I called and spoke with patient about Prozac. I also discussed side effect of medications with her. She stated she does not feel comfortable starting this medication and feels she has not had suicidal ideation since the last episode. I discussed indication for use of Prozac in her situation, she understands. I again recommended that she contacts the psychiatrist office given to her for further management. Return precaution discussed. She agreed with plan.

## 2014-08-01 ENCOUNTER — Ambulatory Visit (INDEPENDENT_AMBULATORY_CARE_PROVIDER_SITE_OTHER): Payer: Self-pay | Admitting: Family Medicine

## 2014-08-01 VITALS — BP 129/77 | HR 90 | Temp 98.3°F | Ht 60.0 in | Wt 157.7 lb

## 2014-08-01 DIAGNOSIS — D485 Neoplasm of uncertain behavior of skin: Secondary | ICD-10-CM

## 2014-08-01 NOTE — Patient Instructions (Signed)
Keep a bandaid on the biopsy site for a couple of hours. You may take it off later today and replace with a new bandaid which you can remove tomorrow. Wash your face normally starting tomorrow. The area should not look infected, it will probably scab a little just like a scrape. Please call us with any problems. We will let you know next week when  The pathology comes back what the next step is. Call us with questions or problems.

## 2014-08-01 NOTE — Progress Notes (Signed)
PROCEDURE NOTE: Punch biopsy Patient given informed consent, signed copy in the chart. Appropriate time out taken. Area prepped and cleaned usual sterile fashion. A 2 cc of lidocaine with epinephrine was used for local anesthesia. Once anesthesia was obtained,  A 24mm keyes punch was used to obtain a sample of  each lesion on the forehead. . This was sent for pathology. Pressure and a small amount of  (Drysol) used for hemostasis. Amount of bleeding was extremely minimal, less than 2 cc. There were no complications. Patient was given post procedure instructions including signs to watch for such as erythema, pain, plus, unusual swelling. Pathology is pending and I will contact the patient regarding results.

## 2014-08-05 ENCOUNTER — Encounter: Payer: Self-pay | Admitting: Family Medicine

## 2014-08-05 ENCOUNTER — Telehealth: Payer: Self-pay | Admitting: Family Medicine

## 2014-08-05 NOTE — Telephone Encounter (Signed)
Dear Betty Chapman Team Please call her and tell her the skin biopsy was NOT CANCER! 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  She should make an appointment to see Dr. [name] so they can discuss options of she wants  It to go away. I am sending her a letter but wanted her to know it was nothing worrisome THANKS! Betty Chapman

## 2014-08-06 ENCOUNTER — Ambulatory Visit (INDEPENDENT_AMBULATORY_CARE_PROVIDER_SITE_OTHER): Payer: Self-pay | Admitting: Family Medicine

## 2014-08-06 ENCOUNTER — Encounter: Payer: Self-pay | Admitting: Family Medicine

## 2014-08-06 VITALS — BP 164/87 | HR 80 | Temp 98.9°F | Wt 160.0 lb

## 2014-08-06 DIAGNOSIS — K047 Periapical abscess without sinus: Secondary | ICD-10-CM

## 2014-08-06 MED ORDER — HYDROCODONE-ACETAMINOPHEN 5-325 MG PO TABS: 1.0000 | ORAL_TABLET | Freq: Four times a day (QID) | ORAL | Status: DC | PRN

## 2014-08-06 MED ORDER — AMOXICILLIN-POT CLAVULANATE 875-125 MG PO TABS: 1.0000 | ORAL_TABLET | Freq: Two times a day (BID) | ORAL | Status: DC

## 2014-08-06 MED ORDER — IBUPROFEN 600 MG PO TABS: 600.0000 mg | ORAL_TABLET | Freq: Three times a day (TID) | ORAL | Status: DC | PRN

## 2014-08-06 NOTE — Patient Instructions (Signed)
Dental Abscess A dental abscess is a collection of infected fluid (pus) from a bacterial infection in the inner part of the tooth (pulp). It usually occurs at the end of the tooth's root.  CAUSES   Severe tooth decay.  Trauma to the tooth that allows bacteria to enter into the pulp, such as a broken or chipped tooth. SYMPTOMS   Severe pain in and around the infected tooth.  Swelling and redness around the abscessed tooth or in the mouth or face.  Tenderness.  Pus drainage.  Bad breath.  Bitter taste in the mouth.  Difficulty swallowing.  Difficulty opening the mouth.  Nausea.  Vomiting.  Chills.  Swollen neck glands. DIAGNOSIS   A medical and dental history will be taken.  An examination will be performed by tapping on the abscessed tooth.  X-rays may be taken of the tooth to identify the abscess. TREATMENT The goal of treatment is to eliminate the infection. You may be prescribed antibiotic medicine to stop the infection from spreading. A root canal may be performed to save the tooth. If the tooth cannot be saved, it may be pulled (extracted) and the abscess may be drained.  HOME CARE INSTRUCTIONS  Only take over-the-counter or prescription medicines for pain, fever, or discomfort as directed by your caregiver.  Rinse your mouth (gargle) often with salt water ( tsp salt in 8 oz [250 ml] of warm water) to relieve pain or swelling.  Do not drive after taking pain medicine (narcotics).  Do not apply heat to the outside of your face.  Return to your dentist for further treatment as directed. SEEK MEDICAL CARE IF:  Your pain is not helped by medicine.  Your pain is getting worse instead of better. SEEK IMMEDIATE MEDICAL CARE IF:  You have a fever or persistent symptoms for more than 2-3 days.  You have a fever and your symptoms suddenly get worse.  You have chills or a very bad headache.  You have problems breathing or swallowing.  You have trouble  opening your mouth.  You have swelling in the neck or around the eye. Document Released: 12/21/2004 Document Revised: 09/15/2011 Document Reviewed: 03/31/2010 [facility] Patient Information 2015 [place], [place]. This information is not intended to replace advice given to you by your health care provider. Make sure you discuss any questions you have with your health care provider.   Thanks for coming in.   You should be called with the dental appointment.   Take the antibiotic as prescribed. Only take the norco if you are in severe pain.   If you develop and of the symptoms that we discussed, then return to the ED.   Thanks for letting us take care of you.   Sincerely,  Paula Compton, MD Family Medicine - PGY 2

## 2014-08-06 NOTE — Progress Notes (Signed)
Patient ID: Betty Chapman, female   DOB: 07/31/1960, 54 y.o.   MRN: [id number]   [facility] Aquilla Hacker, MD Phone: [phone]  Subjective:   # Dental Pain  - pt. Is here with ongoing dental pain in her left upper gum for the past 24 hours.  - she says it started last night and she awoke this am with swelling, and sever pain in her upper two incisors.  - she has had most of her teeth removed previously.  - She wears dentures, and she says it was too painful to even place her dentures.  - She says she has not had anything like this previously.   - She denies, fever, chills, nausea, vomiting, difficulty opening or closing her mouth, spasms, vision changes, difficulty swallowing, or breathing.  - She has not noted neck swelling.  - her swelling is in her upper lip with pain / extension over the maxillary area to behind her eye.  - She says that she has not had any swelling of her neck or underneath her jaw.   All relevant systems were reviewed and were negative unless otherwise noted in the HPI  Past Medical History Reviewed problem list.  Medications- reviewed and updated Current Outpatient Prescriptions  Medication Sig Dispense Refill  . albuterol (PROVENTIL) (2.5 MG/3ML) 0.083% nebulizer solution Take 2.5 mg by nebulization every 6 (six) hours as needed for wheezing or shortness of breath.    [name] albuterol (VENTOLIN HFA) 108 (90 BASE) MCG/ACT inhaler Inhale 2 puffs into the lungs every 6 (six) hours as needed for wheezing. Up to every 4-6 hours. 18 g 5  . amoxicillin-clavulanate (AUGMENTIN) 875-125 MG per tablet Take 1 tablet by mouth 2 (two) times daily. 28 tablet 0  . cyclobenzaprine (FLEXERIL) 5 MG tablet Take 1 tablet (5 mg total) by mouth 3 (three) times daily as needed for muscle spasms. (Patient not taking: Reported on 07/16/2014) 30 tablet 0  . ezetimibe (ZETIA) 10 MG tablet Take 1 tablet (10 mg total) by mouth daily. 90 tablet 1  . FLUoxetine  (PROZAC) 20 MG tablet Take 1 tablet (20 mg total) by mouth daily. 30 tablet 1  . hydrochlorothiazide (HYDRODIURIL) 25 MG tablet Take 1 tablet (25 mg total) by mouth daily. 90 tablet 1  . HYDROcodone-acetaminophen (NORCO) 5-325 MG per tablet Take 1 tablet by mouth every 6 (six) hours as needed for moderate pain. 20 tablet 0  . ibuprofen (ADVIL,MOTRIN) 600 MG tablet Take 1 tablet (600 mg total) by mouth every 8 (eight) hours as needed. 30 tablet 0  . metFORMIN (GLUCOPHAGE) 500 MG tablet Take 1 tablet (500 mg total) by mouth 2 (two) times daily with a meal. 160 tablet 1  . mometasone-formoterol (DULERA) 200-5 MCG/ACT AERO Inhale 2 puffs into the lungs 2 (two) times daily. 8.8 g 0  . pantoprazole (PROTONIX) 40 MG tablet Take 40 mg by mouth daily.    [name] tiotropium (SPIRIVA HANDIHALER) 18 MCG inhalation capsule Place 1 capsule (18 mcg total) into inhaler and inhale daily. 30 capsule 12   No current facility-administered medications for this visit.   Chief complaint-noted No additions to family history Social history- patient is a current smoker  Objective: BP 164/87 mmHg  Pulse 80  Temp(Src) 98.9 F (37.2 C) (Oral)  Wt 160 lb (72.576 kg)  LMP 12/27/2011 Gen: NAD, alert, cooperative with exam HEENT: NCAT, EOMI, PERRL, O/P with poor dentition throughout, notable for periapical abscess of left upper incisor. TTP with  tongue blade and with movement of the tooth. Swelling noted of her left upper lip with external tenderness of the maxilla.  Neck: FROM, supple, no evidence of infectious extension.  CV: RRR, good S1/S2, no murmur Resp: CTABL, no wheezes, non-labored Abd: SNTND, BS present, no guarding or organomegaly Ext: No edema, warm, normal tone, moves UE/LE spontaneously Neuro: Alert and oriented, No gross deficits Skin: no rashes no lesions  Assessment/Plan:  # Dental Abscess  - Pt. With periapical abscess / infection in her left upper incisor without drainable lesion at this time. She has  significant pain and symptoms as well. No airway compromise or extensive soft tissue swelling at this time. Afebrile.  - Will give 14 day course of Augmentin.  - Referred urgently to dentistry. She has the orange card and should be seen at the health department. Pt. To be called with appointment.  - Will give motrin and a few norco for pain control given the severity.  - Return precautions reviewed with patient including worsening infection, and airway symptoms / difficulty swallowing / worsening pain.  - Follow up with PCP as needed.  - Go to the ED if worsening.

## 2014-08-06 NOTE — Telephone Encounter (Signed)
Spoke with Betty Chapman. Informed her of the below results.  Betty Chapman will call and schedule an appt with Dr. [name]. Ottis Stain, CMA

## 2014-08-13 ENCOUNTER — Encounter: Payer: Self-pay | Admitting: Family Medicine

## 2014-08-13 ENCOUNTER — Ambulatory Visit (INDEPENDENT_AMBULATORY_CARE_PROVIDER_SITE_OTHER): Payer: Self-pay | Admitting: Family Medicine

## 2014-08-13 VITALS — BP 145/76 | HR 84 | Temp 98.2°F | Ht 60.0 in | Wt 159.4 lb

## 2014-08-13 DIAGNOSIS — K047 Periapical abscess without sinus: Secondary | ICD-10-CM

## 2014-08-13 DIAGNOSIS — E118 Type 2 diabetes mellitus with unspecified complications: Secondary | ICD-10-CM

## 2014-08-13 DIAGNOSIS — L84 Corns and callosities: Secondary | ICD-10-CM

## 2014-08-13 DIAGNOSIS — F39 Unspecified mood [affective] disorder: Secondary | ICD-10-CM

## 2014-08-13 DIAGNOSIS — I10 Essential (primary) hypertension: Secondary | ICD-10-CM

## 2014-08-13 DIAGNOSIS — J449 Chronic obstructive pulmonary disease, unspecified: Secondary | ICD-10-CM

## 2014-08-13 NOTE — Assessment & Plan Note (Signed)
Rapidly improving s/p A/B treatment. Referral to [facility] dental office already in place. Patient informed they will contact her as soon as possible. Return precaution discussed.

## 2014-08-13 NOTE — Assessment & Plan Note (Addendum)
Compliant with metformin. F/U in 2-3 months for A1C check. Referral to podiatrist done today for foot calluses and DM foot exam.

## 2014-08-13 NOTE — Progress Notes (Signed)
Subjective:     Patient ID: Betty Chapman, female   DOB: 04-17-1960, 54 y.o.   MRN: [id number]  HPI  Mood disorder: She never started taking her Prozac. She did not call mental health office for appointment as instructed during her last visit. She stated she is able to control her mood and her suicidal ideation was a one time thing. She has been fine since then. DM2/HTN:She has been compliant with her meds. Here for follow up, no complaints.  COPD:Compliant with all her inhalers. She used her albuterol more often in the past week due to increase in temp.She denies any chest tightness or SOB, no cough currently. Dental pain: Her pain and swelling has improved a lot, she took only 6 days of her A/B and stopped due to GI upset. She denies any fever. She took her denture off, she feels this might have contributed to her pain. She is yet to receive a call from the dentist's office.  Current Outpatient Prescriptions on File Prior to Visit  Medication Sig Dispense Refill  . hydrochlorothiazide (HYDRODIURIL) 25 MG tablet Take 1 tablet (25 mg total) by mouth daily. 90 tablet 1  . metFORMIN (GLUCOPHAGE) 500 MG tablet Take 1 tablet (500 mg total) by mouth 2 (two) times daily with a meal. 160 tablet 1  . mometasone-formoterol (DULERA) 200-5 MCG/ACT AERO Inhale 2 puffs into the lungs 2 (two) times daily. 8.8 g 0  . pantoprazole (PROTONIX) 40 MG tablet Take 40 mg by mouth daily.    [name] tiotropium (SPIRIVA HANDIHALER) 18 MCG inhalation capsule Place 1 capsule (18 mcg total) into inhaler and inhale daily. 30 capsule 12  . albuterol (PROVENTIL) (2.5 MG/3ML) 0.083% nebulizer solution Take 2.5 mg by nebulization every 6 (six) hours as needed for wheezing or shortness of breath.    [name] albuterol (VENTOLIN HFA) 108 (90 BASE) MCG/ACT inhaler Inhale 2 puffs into the lungs every 6 (six) hours as needed for wheezing. Up to every 4-6 hours. (Patient not taking: Reported on 08/13/2014) 18 g 5  . cyclobenzaprine (FLEXERIL) 5  MG tablet Take 1 tablet (5 mg total) by mouth 3 (three) times daily as needed for muscle spasms. (Patient not taking: Reported on 07/16/2014) 30 tablet 0  . ezetimibe (ZETIA) 10 MG tablet Take 1 tablet (10 mg total) by mouth daily. (Patient not taking: Reported on 08/13/2014) 90 tablet 1  . FLUoxetine (PROZAC) 20 MG tablet Take 1 tablet (20 mg total) by mouth daily. (Patient not taking: Reported on 08/13/2014) 30 tablet 1  . ibuprofen (ADVIL,MOTRIN) 600 MG tablet Take 1 tablet (600 mg total) by mouth every 8 (eight) hours as needed. (Patient not taking: Reported on 08/13/2014) 30 tablet 0   No current facility-administered medications on file prior to visit.   Past Medical History  Diagnosis Date  . Tobacco abuse   . TIA (transient ischemic attack)     Once  . Hypertension   . Hypercholesterolemia   . COPD (chronic obstructive pulmonary disease)      Review of Systems  HENT: Positive for dental problem. Negative for facial swelling.   Respiratory: Negative.   Cardiovascular: Negative.   Gastrointestinal: Negative.   Neurological: Negative.   All other systems reviewed and are negative.      Filed Vitals:   08/13/14 0828  BP: 145/76  Pulse: 84  Temp: 98.2 F (36.8 C)  TempSrc: Oral  Height: 5' (1.524 m)  Weight: 159 lb 7 oz (72.32 kg)   Objective:  Physical Exam  Constitutional: She appears well-developed. No distress.  HENT:  Head:    Mouth/Throat:    Cardiovascular: Normal rate, regular rhythm, normal heart sounds and intact distal pulses.   No murmur heard. Pulmonary/Chest: Effort normal and breath sounds normal. No respiratory distress. She has no wheezes.  Abdominal: Soft. Bowel sounds are normal. She exhibits no distension and no mass. There is no tenderness.  Musculoskeletal: Normal range of motion. She exhibits no edema.  Thick calluses on the sole of her feet, located on ventral surface of her big toe, mid transverse arch area and the calcaneal area on her left  foot. Small calluses on her right first digit and the calcaneal area.  Neurological: She is alert.  Psychiatric: She has a normal mood and affect. Her behavior is normal. Judgment and thought content normal.  Nursing note and vitals reviewed.      Assessment:     Mood disorder: DM2/HTN: COPD: Dental pain:     Plan:     Check problem list.

## 2014-08-13 NOTE — Patient Instructions (Signed)
It was nice seeing you today, I am glad you feel better with your toothache. A dental referral is already in place to  [facility], they should be contacting you soon. I also referred you to a foot doctor for your calluses and DM foot exam. I will like to see you back in about 2-3 months. Continue current medications for DM2 and HTN.

## 2014-08-13 NOTE — Assessment & Plan Note (Signed)
Currently not on meds. She is doing well and not suicidal. She is refusing further treatment or referral. We will monitor for now off treatment. Return precaution discussed.

## 2014-08-13 NOTE — Assessment & Plan Note (Signed)
Stable on Spiriva and Dulera. Continue same for now.

## 2014-08-13 NOTE — Assessment & Plan Note (Signed)
BP slightly elevated. No dose adjusted needed today however. I will reassess her BP at next visit.

## 2014-08-15 ENCOUNTER — Ambulatory Visit: Payer: Self-pay

## 2014-08-20 ENCOUNTER — Other Ambulatory Visit: Payer: Self-pay | Admitting: *Deleted

## 2014-08-21 ENCOUNTER — Ambulatory Visit: Payer: Self-pay | Admitting: Podiatry

## 2014-08-21 ENCOUNTER — Other Ambulatory Visit: Payer: Self-pay | Admitting: Family Medicine

## 2014-08-21 ENCOUNTER — Ambulatory Visit: Payer: Self-pay

## 2014-08-21 ENCOUNTER — Other Ambulatory Visit: Payer: Self-pay | Admitting: *Deleted

## 2014-08-21 MED ORDER — ALBUTEROL SULFATE (2.5 MG/3ML) 0.083% IN NEBU: 2.5000 mg | INHALATION_SOLUTION | Freq: Four times a day (QID) | RESPIRATORY_TRACT | Status: DC | PRN

## 2014-08-21 MED ORDER — MOMETASONE FURO-FORMOTEROL FUM 200-5 MCG/ACT IN AERO: 2.0000 | INHALATION_SPRAY | Freq: Two times a day (BID) | RESPIRATORY_TRACT | Status: DC

## 2014-08-21 MED ORDER — ALBUTEROL SULFATE HFA 108 (90 BASE) MCG/ACT IN AERS: 2.0000 | INHALATION_SPRAY | Freq: Four times a day (QID) | RESPIRATORY_TRACT | Status: DC | PRN

## 2014-08-21 NOTE — Telephone Encounter (Signed)
Received a fax from [facility] stating the free Dulera inhalers are 13 gm, which they have 3 inhalers.  Can they dispense 3 x 13 gm for a 90 day supply.  If ok to change please send new Rx for the 90 day supply.  Derl Barrow, RN

## 2014-09-02 ENCOUNTER — Ambulatory Visit: Payer: Self-pay

## 2014-11-11 ENCOUNTER — Other Ambulatory Visit: Payer: Self-pay | Admitting: *Deleted

## 2014-11-12 MED ORDER — MOMETASONE FURO-FORMOTEROL FUM 200-5 MCG/ACT IN AERO: 2.0000 | INHALATION_SPRAY | Freq: Two times a day (BID) | RESPIRATORY_TRACT | Status: DC

## 2015-01-21 ENCOUNTER — Telehealth: Payer: Self-pay | Admitting: *Deleted

## 2015-01-21 NOTE — Telephone Encounter (Signed)
Called patient to offer flu vaccine, however, VM picked up.  Left message on patient's voice mail to return call. DUCATTE, LAURENZE L, RN   

## 2015-02-03 ENCOUNTER — Ambulatory Visit: Payer: Self-pay

## 2015-02-25 NOTE — Telephone Encounter (Signed)
Second attempt to reach patient to offer flu vaccine, however, VM picked up.  Left message on patient's voice mail to return call. Velora Heckler, RN

## 2015-03-03 ENCOUNTER — Other Ambulatory Visit: Payer: Self-pay | Admitting: *Deleted

## 2015-03-03 MED ORDER — ALBUTEROL SULFATE HFA 108 (90 BASE) MCG/ACT IN AERS: 2.0000 | INHALATION_SPRAY | Freq: Four times a day (QID) | RESPIRATORY_TRACT | Status: DC | PRN

## 2015-03-03 MED ORDER — MOMETASONE FURO-FORMOTEROL FUM 200-5 MCG/ACT IN AERO: 2.0000 | INHALATION_SPRAY | Freq: Two times a day (BID) | RESPIRATORY_TRACT | Status: DC

## 2015-04-09 ENCOUNTER — Other Ambulatory Visit: Payer: Self-pay | Admitting: *Deleted

## 2015-04-10 MED ORDER — EZETIMIBE 10 MG PO TABS: 10.0000 mg | ORAL_TABLET | Freq: Every day | ORAL | Status: DC

## 2016-08-06 ENCOUNTER — Encounter: Payer: Self-pay | Admitting: Family Medicine

## 2016-08-06 ENCOUNTER — Ambulatory Visit (INDEPENDENT_AMBULATORY_CARE_PROVIDER_SITE_OTHER): Payer: Medicare Other | Admitting: Family Medicine

## 2016-08-06 VITALS — BP 142/76 | HR 106 | Temp 98.0°F | Wt 167.0 lb

## 2016-08-06 DIAGNOSIS — I10 Essential (primary) hypertension: Secondary | ICD-10-CM | POA: Diagnosis not present

## 2016-08-06 DIAGNOSIS — E785 Hyperlipidemia, unspecified: Secondary | ICD-10-CM | POA: Diagnosis not present

## 2016-08-06 DIAGNOSIS — J449 Chronic obstructive pulmonary disease, unspecified: Secondary | ICD-10-CM | POA: Diagnosis not present

## 2016-08-06 DIAGNOSIS — E118 Type 2 diabetes mellitus with unspecified complications: Secondary | ICD-10-CM

## 2016-08-06 DIAGNOSIS — Z1159 Encounter for screening for other viral diseases: Secondary | ICD-10-CM

## 2016-08-06 LAB — POCT GLYCOSYLATED HEMOGLOBIN (HGB A1C): Hemoglobin A1C: 9.1

## 2016-08-06 MED ORDER — ALBUTEROL SULFATE (2.5 MG/3ML) 0.083% IN NEBU: 2.5000 mg | INHALATION_SOLUTION | Freq: Four times a day (QID) | RESPIRATORY_TRACT | 3 refills | Status: DC | PRN

## 2016-08-06 MED ORDER — EZETIMIBE 10 MG PO TABS: 10.0000 mg | ORAL_TABLET | Freq: Every day | ORAL | 1 refills | Status: DC

## 2016-08-06 MED ORDER — TETANUS-DIPHTH-ACELL PERTUSSIS 5-2-15.5 LF-MCG/0.5 IM SUSP: 0.5000 mL | Freq: Once | INTRAMUSCULAR | 0 refills | Status: AC

## 2016-08-06 MED ORDER — ALBUTEROL SULFATE HFA 108 (90 BASE) MCG/ACT IN AERS: 2.0000 | INHALATION_SPRAY | Freq: Four times a day (QID) | RESPIRATORY_TRACT | 1 refills | Status: DC | PRN

## 2016-08-06 MED ORDER — MOMETASONE FURO-FORMOTEROL FUM 200-5 MCG/ACT IN AERO: 2.0000 | INHALATION_SPRAY | Freq: Two times a day (BID) | RESPIRATORY_TRACT | 2 refills | Status: DC

## 2016-08-06 MED ORDER — TIOTROPIUM BROMIDE MONOHYDRATE 18 MCG IN CAPS: 18.0000 ug | ORAL_CAPSULE | Freq: Every day | RESPIRATORY_TRACT | 1 refills | Status: DC

## 2016-08-06 MED ORDER — METFORMIN HCL 500 MG PO TABS: 500.0000 mg | ORAL_TABLET | Freq: Two times a day (BID) | ORAL | 1 refills | Status: DC

## 2016-08-06 MED ORDER — LISINOPRIL 5 MG PO TABS: 5.0000 mg | ORAL_TABLET | Freq: Every day | ORAL | 1 refills | Status: DC

## 2016-08-06 NOTE — Assessment & Plan Note (Signed)
Zetia refilled. FLP checked today.

## 2016-08-06 NOTE — Assessment & Plan Note (Signed)
BP looks good despite being off HCTZ for more than 6 months. I discontinued HCTZ and started ACEi for renal protection since she has DM. Monitor BP at home.

## 2016-08-06 NOTE — Progress Notes (Signed)
Subjective:     Patient ID: Betty Chapman, female   DOB: 04/24/1960, 56 y.o.   MRN: [id number]  HPI DM2/HTN/HLD: Off meds for more than 6 months due to insurance issue. 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 In the last few months she has been using home remedy for her cholesterol and BP; she takes red yeast rice, honey, vinegar and cinnamon. COPD:She is compliant with her meds. She had few supplies left and will need a refill. Denies any other concern. HM: Here for routine health care.  Current Outpatient Prescriptions on File Prior to Visit  Medication Sig Dispense Refill  . mometasone-formoterol (DULERA) 200-5 MCG/ACT AERO Inhale 2 puffs into the lungs 2 (two) times daily. 13 g 5  . tiotropium (SPIRIVA HANDIHALER) 18 MCG inhalation capsule Place 1 capsule (18 mcg total) into inhaler and inhale daily. 30 capsule 12  . albuterol (PROVENTIL) (2.5 MG/3ML) 0.083% nebulizer solution Take 3 mLs (2.5 mg total) by nebulization every 6 (six) hours as needed for wheezing or shortness of breath. (Patient not taking: Reported on 08/06/2016) 75 mL 3  . albuterol (VENTOLIN HFA) 108 (90 Base) MCG/ACT inhaler Inhale 2 puffs into the lungs every 6 (six) hours as needed for wheezing. Up to every 4-6 hours. (Patient not taking: Reported on 08/06/2016) 18 g 5  . cyclobenzaprine (FLEXERIL) 5 MG tablet Take 1 tablet (5 mg total) by mouth 3 (three) times daily as needed for muscle spasms. (Patient not taking: Reported on 07/16/2014) 30 tablet 0  . ezetimibe (ZETIA) 10 MG tablet Take 1 tablet (10 mg total) by mouth daily. (Patient not taking: Reported on 08/06/2016) 90 tablet 1  . FLUoxetine (PROZAC) 20 MG tablet Take 1 tablet (20 mg total) by mouth daily. (Patient not taking: Reported on 08/13/2014) 30 tablet 1  . hydrochlorothiazide (HYDRODIURIL) 25 MG tablet Take 1 tablet (25 mg total) by mouth daily. (Patient not taking: Reported on 08/06/2016) 90 tablet 1  . ibuprofen (ADVIL,MOTRIN) 600 MG tablet Take  1 tablet (600 mg total) by mouth every 8 (eight) hours as needed. (Patient not taking: Reported on 08/13/2014) 30 tablet 0  . metFORMIN (GLUCOPHAGE) 500 MG tablet Take 1 tablet (500 mg total) by mouth 2 (two) times daily with a meal. (Patient not taking: Reported on 08/06/2016) 160 tablet 1  . pantoprazole (PROTONIX) 40 MG tablet Take 40 mg by mouth daily.     No current facility-administered medications on file prior to visit.    Past Medical History:  Diagnosis Date  . COPD (chronic obstructive pulmonary disease) ([place])   . Hypercholesterolemia   . Hypertension   . TIA (transient ischemic attack)    Once  . Tobacco abuse     Review of Systems  Respiratory: Negative.   Cardiovascular: Negative.   Gastrointestinal: Negative.   All other systems reviewed and are negative.      Objective:   Physical Exam  Constitutional: She is oriented to person, place, and time. She appears well-developed. No distress.  Cardiovascular: Normal rate, regular rhythm, normal heart sounds and intact distal pulses.   No murmur heard. Pulmonary/Chest: Effort normal and breath sounds normal. No respiratory distress. She has no wheezes.  Abdominal: Soft. Bowel sounds are normal. She exhibits no distension and no mass. There is no tenderness.  Musculoskeletal: Normal range of motion.  Sensory exam of the foot is normal, tested with the monofilament. Good pulses, no lesions or ulcers, good peripheral pulses.  ++ Calluses on  both feet worse on left.   Neurological: She is alert and oriented to person, place, and time. No cranial nerve deficit.  Nursing note and vitals reviewed.      Assessment:     DM2 HTN HLD COPD Health maintenance    Plan:     For health maintenance, mammogram slip given today and she was advised to call for her appointment. She agrred with plan. She declined Pneumovax. Tdap script given to take to her pharmacy. She stated she was advised to repeat colonoscopy in 3 yrs which  will be next year. I recommended that she contact her gastroenterologist to discuss follow-up plan. She verbalized uderstanding. Hep C screening done today.

## 2016-08-06 NOTE — Patient Instructions (Signed)

## 2016-08-06 NOTE — Assessment & Plan Note (Signed)
Stable. I refilled her meds today.

## 2016-08-06 NOTE — Assessment & Plan Note (Signed)
Off meds >6 months due to insurance issue. A1C increased from 6 to 9.1 which is expected. No adjustment to her DM regimen warranted since she had been previously well controlled on Metformin. I refilled her Metformin to her new pharmacy. Foot exam completed and foot hygiene instruction given. Eye exam recommended. She stated her insurance will not cover ophthalmology visit for now, hence she will hold off on ophthalmology referral. Urine micro-albumin checked since she had not been on ACEI or ARBs. Bmet checked.

## 2016-08-06 NOTE — Addendum Note (Signed)
Addended by: [place] Pink on: 08/06/2016 04:02 PM   Modules accepted: Orders

## 2016-08-07 LAB — BASIC METABOLIC PANEL
BUN/Creatinine Ratio: 13 (ref 9–23)
BUN: 8 mg/dL (ref 6–24)
CO2: 23 mmol/L (ref 20–29)
Calcium: 9.7 mg/dL (ref 8.7–10.2)
Chloride: 100 mmol/L (ref 96–106)
Creatinine, Ser: 0.61 mg/dL (ref 0.57–1.00)
GFR calc Af Amer: 117 mL/min/{1.73_m2} (ref >59)
GFR calc non Af Amer: 102 mL/min/{1.73_m2} (ref >59)
Glucose: 193 mg/dL — ABNORMAL HIGH (ref 65–99)
Potassium: 4 mmol/L (ref 3.5–5.2)
Sodium: 140 mmol/L (ref 134–144)

## 2016-08-07 LAB — HEPATITIS C ANTIBODY: Hep C Virus Ab: <0.1 s/co ratio (ref 0.0–0.9)

## 2016-08-07 LAB — LIPID PANEL
Chol/HDL Ratio: 4.8 ratio — ABNORMAL HIGH (ref 0.0–4.4)
Cholesterol, Total: 202 mg/dL — ABNORMAL HIGH (ref 100–199)
HDL: 42 mg/dL (ref >39)
LDL Calculated: 114 mg/dL — ABNORMAL HIGH (ref 0–99)
Triglycerides: 230 mg/dL — ABNORMAL HIGH (ref 0–149)
VLDL Cholesterol Cal: 46 mg/dL — ABNORMAL HIGH (ref 5–40)

## 2016-08-07 LAB — MICROALBUMIN / CREATININE URINE RATIO
Creatinine, Urine: 25.3 mg/dL
Microalb/Creat Ratio: 24.9 mg/g creat (ref 0.0–30.0)
Microalbumin, Urine: 6.3 ug/mL

## 2016-08-09 ENCOUNTER — Telehealth: Payer: Self-pay | Admitting: Family Medicine

## 2016-08-09 NOTE — Telephone Encounter (Signed)
HIPPA compliant message left for patient to call me back.

## 2016-08-13 ENCOUNTER — Other Ambulatory Visit: Payer: Self-pay | Admitting: Family Medicine

## 2016-08-13 DIAGNOSIS — Z1231 Encounter for screening mammogram for malignant neoplasm of breast: Secondary | ICD-10-CM

## 2016-09-28 ENCOUNTER — Ambulatory Visit: Payer: Self-pay

## 2016-10-11 ENCOUNTER — Ambulatory Visit
Admission: RE | Admit: 2016-10-11 | Discharge: 2016-10-11 | Disposition: A | Discharge status: Home / Self Care | Payer: Medicare Other | Source: Ambulatory Visit | Attending: Family Medicine | Admitting: Family Medicine

## 2016-10-11 DIAGNOSIS — E119 Type 2 diabetes mellitus without complications: Secondary | ICD-10-CM | POA: Diagnosis not present

## 2016-10-11 DIAGNOSIS — Z1231 Encounter for screening mammogram for malignant neoplasm of breast: Secondary | ICD-10-CM | POA: Diagnosis not present

## 2016-10-11 DIAGNOSIS — H40033 Anatomical narrow angle, bilateral: Secondary | ICD-10-CM | POA: Diagnosis not present

## 2016-10-11 LAB — HM DIABETES EYE EXAM

## 2016-11-23 ENCOUNTER — Encounter: Payer: Self-pay | Admitting: Family Medicine

## 2016-11-23 ENCOUNTER — Other Ambulatory Visit: Payer: Self-pay

## 2016-11-23 ENCOUNTER — Ambulatory Visit (INDEPENDENT_AMBULATORY_CARE_PROVIDER_SITE_OTHER): Payer: Medicare Other | Admitting: Family Medicine

## 2016-11-23 VITALS — BP 122/84 | HR 99 | Temp 98.1°F | Ht 60.0 in | Wt 164.0 lb

## 2016-11-23 DIAGNOSIS — J449 Chronic obstructive pulmonary disease, unspecified: Secondary | ICD-10-CM

## 2016-11-23 DIAGNOSIS — K219 Gastro-esophageal reflux disease without esophagitis: Secondary | ICD-10-CM

## 2016-11-23 DIAGNOSIS — E118 Type 2 diabetes mellitus with unspecified complications: Secondary | ICD-10-CM | POA: Diagnosis not present

## 2016-11-23 DIAGNOSIS — H9202 Otalgia, left ear: Secondary | ICD-10-CM | POA: Diagnosis not present

## 2016-11-23 DIAGNOSIS — E782 Mixed hyperlipidemia: Secondary | ICD-10-CM

## 2016-11-23 DIAGNOSIS — M25532 Pain in left wrist: Secondary | ICD-10-CM

## 2016-11-23 DIAGNOSIS — M25531 Pain in right wrist: Secondary | ICD-10-CM | POA: Diagnosis not present

## 2016-11-23 DIAGNOSIS — F39 Unspecified mood [affective] disorder: Secondary | ICD-10-CM

## 2016-11-23 LAB — POCT GLYCOSYLATED HEMOGLOBIN (HGB A1C): Hemoglobin A1C: 6.7

## 2016-11-23 MED ORDER — RANITIDINE HCL 150 MG PO CAPS: 150.0000 mg | ORAL_CAPSULE | Freq: Two times a day (BID) | ORAL | 1 refills | Status: DC

## 2016-11-23 NOTE — Progress Notes (Signed)
Subjective:     Patient ID: Betty Chapman, female   DOB: 03-05-1960, 56 y.o.   MRN: [id number]  HPI DM2/HLD:Compliant with her meds, here for follow-up. COPD:Denies any concern. She is compliant with her meds. Uses albuterol occasionally. Mood: She is off her Prozac. She stated, she does not need to be on med, she is doing well, denies depression, no suicidal ideation. Feels well in general. Left ear pain:C/O left ear irritation x 1 month, on and off. Not really a pain, but feels hot inside. At times whenever she eats the pain shots through her left ear. She is currently asymptomatic. Wrist pain:C/O B/L wrist pain on going for more than 6 months. Patient feels some knot in her wrist, no swelling, no redness. Reflux:C/O worsening reflux issue, she has been off meds for years. Her symptoms is triggered by spicy food. Denies change in bowel habit, no blood in her stool HM: here for routine health care. No concern.  Current Outpatient Medications on File Prior to Visit  Medication Sig Dispense Refill  . ezetimibe (ZETIA) 10 MG tablet Take 1 tablet (10 mg total) by mouth daily. 90 tablet 1  . lisinopril (PRINIVIL,ZESTRIL) 5 MG tablet Take 1 tablet (5 mg total) by mouth daily. 90 tablet 1  . metFORMIN (GLUCOPHAGE) 500 MG tablet Take 1 tablet (500 mg total) by mouth 2 (two) times daily with a meal. 180 tablet 1  . mometasone-formoterol (DULERA) 200-5 MCG/ACT AERO Inhale 2 puffs into the lungs 2 (two) times daily. 13 g 2  . pantoprazole (PROTONIX) 40 MG tablet Take 40 mg by mouth daily.    [name] tiotropium (SPIRIVA HANDIHALER) 18 MCG inhalation capsule Place 1 capsule (18 mcg total) into inhaler and inhale daily. 90 capsule 1  . albuterol (PROVENTIL HFA;VENTOLIN HFA) 108 (90 Base) MCG/ACT inhaler Inhale 2 puffs into the lungs every 6 (six) hours as needed for wheezing or shortness of breath. (Patient not taking: Reported on 11/23/2016) 3 Inhaler 1  . albuterol (PROVENTIL) (2.5 MG/3ML) 0.083% nebulizer  solution Take 3 mLs (2.5 mg total) by nebulization every 6 (six) hours as needed for wheezing or shortness of breath. (Patient not taking: Reported on 11/23/2016) 75 mL 3  . FLUoxetine (PROZAC) 20 MG tablet Take 1 tablet (20 mg total) by mouth daily. (Patient not taking: Reported on 11/23/2016) 30 tablet 1  . ibuprofen (ADVIL,MOTRIN) 600 MG tablet Take 1 tablet (600 mg total) by mouth every 8 (eight) hours as needed. (Patient not taking: Reported on 08/13/2014) 30 tablet 0   No current facility-administered medications on file prior to visit.    Past Medical History:  Diagnosis Date  . COPD (chronic obstructive pulmonary disease) ([place])   . Hypercholesterolemia   . Hypertension   . TIA (transient ischemic attack)    Once  . Tobacco abuse    Vitals:   11/23/16 0847  BP: 122/84  Pulse: 99  Temp: 98.1 F (36.7 C)  TempSrc: Oral  SpO2: 93%  Weight: 164 lb (74.4 kg)  Height: 5' (1.524 m)     Review of Systems  Constitutional: Negative.   HENT:       Ear discomfort  Respiratory: Negative.   Cardiovascular: Negative.   Gastrointestinal: Negative.   Musculoskeletal: Positive for arthralgias.  Neurological: Negative.   Psychiatric/Behavioral: The patient is not nervous/anxious.   All other systems reviewed and are negative.      Objective:   Physical Exam  Constitutional: She is oriented to person, place, and time. She  appears well-developed. No distress.  HENT:  Right Ear: Tympanic membrane, external ear and ear canal normal. No drainage or tenderness.  Left Ear: Tympanic membrane, external ear and ear canal normal. No drainage or tenderness.  Mouth/Throat: Uvula is midline and oropharynx is clear and moist.  Neck: Neck supple.  Cardiovascular: Normal rate, regular rhythm and normal heart sounds.  No murmur heard. Pulmonary/Chest: Effort normal and breath sounds normal. No respiratory distress. She has no wheezes.  Abdominal: Soft. Bowel sounds are normal. She exhibits no  distension and no mass. There is no tenderness.  Musculoskeletal: Normal range of motion. She exhibits no edema or deformity.       Right wrist: She exhibits tenderness. She exhibits normal range of motion, no bony tenderness, no swelling, no crepitus, no deformity and no laceration.       Left wrist: She exhibits tenderness. She exhibits normal range of motion, no swelling, no crepitus, no deformity and no laceration.  Neurological: She is alert and oriented to person, place, and time. No cranial nerve deficit.  Psychiatric: Her speech is normal and behavior is normal. Thought content normal. Her mood appears not anxious. She does not exhibit a depressed mood. She expresses no homicidal and no suicidal ideation.  Nursing note and vitals reviewed.      Assessment:     DM2 HLD COPD Mood Disorder Left ear discomfort Arthritis Wrist GERD Health maintenance: Flu shot, Tdap and Pneumovax offered, she declined all.    Plan:     Check problem list.

## 2016-11-23 NOTE — Progress Notes (Signed)
Patient decline immunizations today.  Declined immunization are Flu, tdap and pneumovax 23.  Informed patient that we will continue to recommend these vaccines and she voiced understanding.   Andreas Newport, [place]

## 2016-11-23 NOTE — Assessment & Plan Note (Signed)
No acute problem. Continue current regimen.

## 2016-11-23 NOTE — Assessment & Plan Note (Signed)
Hx of depression with suicidal ideation. She seems to be doing well. She self d/c her meds Prozac. Monitor off meds for now. No suicidal ideation currently.

## 2016-11-23 NOTE — Assessment & Plan Note (Signed)
Compliant with meds. A1C improved a lot. Continue current med regimen. Metformin 500 mg BID. F/U in 4 months.

## 2016-11-23 NOTE — Assessment & Plan Note (Signed)
Compliant with Zetia. I recommended fish oil 2 tab BID for hypertriglyceridemia. F/U in 4 months.

## 2016-11-23 NOTE — Assessment & Plan Note (Signed)
Likely arthritis. Tylenol as needed for pain recommended. Consider xray if no improvement.

## 2016-11-23 NOTE — Assessment & Plan Note (Signed)
Ear exam looks good.  Unclear cause of her symptoms. ?? TMJ disorder since this at times is associated with chewing or yawning. May do home jaw exercise. Monitor for now. Return soon if it persists or worsens. She agreed with plan.

## 2016-11-23 NOTE — Patient Instructions (Signed)
Food Choices for Gastroesophageal Reflux Disease, Adult When you have gastroesophageal reflux disease (GERD), the foods you eat and your eating habits are very important. Choosing the right foods can help ease your discomfort. What guidelines do I need to follow?  Choose fruits, vegetables, whole grains, and low-fat dairy products.  Choose low-fat meat, fish, and poultry.  Limit fats such as oils, salad dressings, butter, nuts, and avocado.  Keep a food diary. This helps you identify foods that cause symptoms.  Avoid foods that cause symptoms. These may be different for everyone.  Eat small meals often instead of 3 large meals a day.  Eat your meals slowly, in a place where you are relaxed.  Limit fried foods.  Cook foods using methods other than frying.  Avoid drinking alcohol.  Avoid drinking large amounts of liquids with your meals.  Avoid bending over or lying down until 2-3 hours after eating. What foods are not recommended? These are some foods and drinks that may make your symptoms worse: Vegetables  Tomatoes. Tomato juice. Tomato and spaghetti sauce. Chili peppers. Onion and garlic. Horseradish. Fruits  Oranges, grapefruit, and lemon (fruit and juice). Meats  High-fat meats, fish, and poultry. This includes hot dogs, ribs, ham, sausage, salami, and bacon. Dairy  Whole milk and chocolate milk. Sour cream. Cream. Butter. Ice cream. Cream cheese. Drinks  Coffee and tea. Bubbly (carbonated) drinks or energy drinks. Condiments  Hot sauce. Barbecue sauce. Sweets/Desserts  Chocolate and cocoa. Donuts. Peppermint and spearmint. Fats and Oils  High-fat foods. This includes French fries and potato chips. Other  Vinegar. Strong spices. This includes black pepper, white pepper, red pepper, cayenne, curry powder, cloves, ginger, and chili powder. The items listed above may not be a complete list of foods and drinks to avoid. Contact your dietitian for more information.    This information is not intended to replace advice given to you by your health care provider. Make sure you discuss any questions you have with your health care provider. Document Released: 06/22/2011 Document Revised: 05/29/2015 Document Reviewed: 10/25/2012 Elsevier Interactive Patient Education  2017 Elsevier Inc.  

## 2016-11-23 NOTE — Assessment & Plan Note (Signed)
Start Ranitidine. Prescription called in to her pharmacy. Further evaluation if no improvement on meds.

## 2016-12-03 ENCOUNTER — Other Ambulatory Visit: Payer: Self-pay | Admitting: Family Medicine

## 2016-12-03 ENCOUNTER — Telehealth: Payer: Self-pay | Admitting: Family Medicine

## 2016-12-03 MED ORDER — RANITIDINE HCL 150 MG PO CAPS: 150.0000 mg | ORAL_CAPSULE | Freq: Two times a day (BID) | ORAL | 1 refills | Status: DC

## 2016-12-03 NOTE — Telephone Encounter (Signed)
Will forward to MD to resend this medication to [facility]. Jazmin Hartsell,CMA

## 2016-12-03 NOTE — Telephone Encounter (Signed)
Tried to contact mail order pharmacy but the provider line was closed.  Will try again next week if patient doesn't hear from the pharmacy that it was sent. Jazmin Hartsell,CMA

## 2016-12-03 NOTE — Telephone Encounter (Signed)
[facility] called and said they never received the Rx for Zantac from 11/20. Please resend eRX.

## 2016-12-03 NOTE — Telephone Encounter (Signed)
I just resent it. Please call to confirm they have it. If they don't have it, it means we have the wrong pharmacy on file and it needs to be updated.

## 2017-01-11 ENCOUNTER — Encounter: Payer: Self-pay | Admitting: Family Medicine

## 2017-01-11 ENCOUNTER — Other Ambulatory Visit: Payer: Self-pay

## 2017-01-11 ENCOUNTER — Ambulatory Visit (INDEPENDENT_AMBULATORY_CARE_PROVIDER_SITE_OTHER): Payer: Medicare Other | Admitting: Family Medicine

## 2017-01-11 VITALS — BP 138/90 | HR 85 | Temp 98.2°F | Ht 60.0 in | Wt 159.0 lb

## 2017-01-11 DIAGNOSIS — J441 Chronic obstructive pulmonary disease with (acute) exacerbation: Secondary | ICD-10-CM

## 2017-01-11 MED ORDER — BENZONATATE 200 MG PO CAPS: 200.0000 mg | ORAL_CAPSULE | Freq: Two times a day (BID) | ORAL | 0 refills | Status: AC | PRN

## 2017-01-11 MED ORDER — AZITHROMYCIN 250 MG PO TABS: ORAL_TABLET | ORAL | 0 refills | Status: AC

## 2017-01-11 NOTE — Progress Notes (Signed)
Subjective:     Patient ID: Betty Chapman, female   DOB: [birth year], 57 y.o.   MRN: [id number]  Cough  This is a new problem. The current episode started 1 to 4 weeks ago (About 3 weeks ago). The problem has been unchanged. The problem occurs constantly. The cough is productive of sputum (Clear sputum no blood in the it). Associated symptoms include nasal congestion, shortness of breath and wheezing. Pertinent negatives include no chest pain, fever or myalgias. The symptoms are aggravated by dust (Smoking). Risk factors for lung disease include smoking/tobacco exposure. Treatments tried: Uses her albuterol more often at least daily. The treatment provided mild relief. Her past medical history is significant for COPD.  She is compliant with all her COPD meds.  Current Outpatient Medications on File Prior to Visit  Medication Sig Dispense Refill  . albuterol (PROVENTIL HFA;VENTOLIN HFA) 108 (90 Base) MCG/ACT inhaler Inhale 2 puffs into the lungs every 6 (six) hours as needed for wheezing or shortness of breath. (Patient not taking: Reported on 11/23/2016) 3 Inhaler 1  . albuterol (PROVENTIL) (2.5 MG/3ML) 0.083% nebulizer solution Take 3 mLs (2.5 mg total) by nebulization every 6 (six) hours as needed for wheezing or shortness of breath. (Patient not taking: Reported on 11/23/2016) 75 mL 3  . ezetimibe (ZETIA) 10 MG tablet TAKE 1 TABLET BY MOUTH  DAILY 90 tablet 1  . FLUoxetine (PROZAC) 20 MG tablet Take 1 tablet (20 mg total) by mouth daily. (Patient not taking: Reported on 11/23/2016) 30 tablet 1  . ibuprofen (ADVIL,MOTRIN) 600 MG tablet Take 1 tablet (600 mg total) by mouth every 8 (eight) hours as needed. (Patient not taking: Reported on 08/13/2014) 30 tablet 0  . lisinopril (PRINIVIL,ZESTRIL) 5 MG tablet TAKE 1 TABLET BY MOUTH  DAILY 90 tablet 1  . metFORMIN (GLUCOPHAGE) 500 MG tablet TAKE 1 TABLET BY MOUTH TWO  TIMES DAILY WITH A MEAL 180 tablet 1  . mometasone-formoterol (DULERA) 200-5 MCG/ACT  AERO Inhale 2 puffs into the lungs 2 (two) times daily. 13 g 2  . pantoprazole (PROTONIX) 40 MG tablet Take 40 mg by mouth daily.    . ranitidine (ZANTAC) 150 MG capsule Take 1 capsule (150 mg total) by mouth 2 (two) times daily. 180 capsule 1  . tiotropium (SPIRIVA HANDIHALER) 18 MCG inhalation capsule Place 1 capsule (18 mcg total) into inhaler and inhale daily. 90 capsule 1   No current facility-administered medications on file prior to visit.    Past Medical History:  Diagnosis Date  . Blurry vision, bilateral 03/20/2014  . COPD (chronic obstructive pulmonary disease) ([place])   . Hypercholesterolemia   . Hypertension   . TIA (transient ischemic attack)    Once  . Tobacco abuse    Vitals:   01/11/17 0909  BP: 138/90  Pulse: 85  Temp: 98.2 F (36.8 C)  TempSrc: Oral  SpO2: 96%  Weight: 159 lb (72.1 kg)  Height: 5' (1.524 m)    Review of Systems  Constitutional: Negative for fever.  Respiratory: Positive for cough, shortness of breath and wheezing.   Cardiovascular: Negative for chest pain.  Musculoskeletal: Negative for myalgias.  All other systems reviewed and are negative.      Objective:   Physical Exam  Constitutional: She appears well-developed. No distress.  Cardiovascular: Normal rate, regular rhythm and normal heart sounds.  No murmur heard. Pulmonary/Chest: Effort normal. No respiratory distress. She has no wheezes. She has rhonchi in the right lower field and the left lower  field. She has no rales.  Abdominal: Soft. There is no tenderness.  Musculoskeletal: She exhibits no edema.  Nursing note and vitals reviewed.      Assessment:     Cough: Likely Bronchitis/Mild COPD exacerbation    Plan:     No wheezing likely due to recent Albuterol treatment at home. No treatment given today. Zpak prescribed. Tessalon pearl prn cough. Continue home COPD regimen including Albuterol prn. Return precaution discussed.

## 2017-01-11 NOTE — Patient Instructions (Signed)

## 2017-02-04 ENCOUNTER — Encounter: Payer: Self-pay | Admitting: Family Medicine

## 2017-05-17 ENCOUNTER — Other Ambulatory Visit: Payer: Self-pay

## 2017-05-17 ENCOUNTER — Encounter: Payer: Self-pay | Admitting: Family Medicine

## 2017-05-17 ENCOUNTER — Ambulatory Visit (INDEPENDENT_AMBULATORY_CARE_PROVIDER_SITE_OTHER): Payer: Medicare Other | Admitting: Family Medicine

## 2017-05-17 VITALS — BP 124/70 | HR 57 | Temp 97.6°F | Ht 60.0 in | Wt 157.0 lb

## 2017-05-17 DIAGNOSIS — D1722 Benign lipomatous neoplasm of skin and subcutaneous tissue of left arm: Secondary | ICD-10-CM

## 2017-05-17 DIAGNOSIS — L720 Epidermal cyst: Secondary | ICD-10-CM | POA: Diagnosis not present

## 2017-05-17 DIAGNOSIS — D1720 Benign lipomatous neoplasm of skin and subcutaneous tissue of unspecified limb: Secondary | ICD-10-CM | POA: Insufficient documentation

## 2017-05-17 DIAGNOSIS — F329 Major depressive disorder, single episode, unspecified: Secondary | ICD-10-CM | POA: Insufficient documentation

## 2017-05-17 DIAGNOSIS — E0800 Diabetes mellitus due to underlying condition with hyperosmolarity without nonketotic hyperglycemic-hyperosmolar coma (NKHHC): Secondary | ICD-10-CM | POA: Diagnosis not present

## 2017-05-17 DIAGNOSIS — F339 Major depressive disorder, recurrent, unspecified: Secondary | ICD-10-CM | POA: Diagnosis not present

## 2017-05-17 DIAGNOSIS — I10 Essential (primary) hypertension: Secondary | ICD-10-CM | POA: Diagnosis not present

## 2017-05-17 DIAGNOSIS — Z794 Long term (current) use of insulin: Secondary | ICD-10-CM | POA: Diagnosis not present

## 2017-05-17 DIAGNOSIS — E782 Mixed hyperlipidemia: Secondary | ICD-10-CM | POA: Diagnosis not present

## 2017-05-17 LAB — POCT GLYCOSYLATED HEMOGLOBIN (HGB A1C): Hemoglobin A1C: 6.4

## 2017-05-17 MED ORDER — ASPIRIN 81 MG PO TABS: 81.0000 mg | ORAL_TABLET | Freq: Every day | ORAL | 12 refills | Status: DC

## 2017-05-17 MED ORDER — SERTRALINE HCL 50 MG PO TABS: 50.0000 mg | ORAL_TABLET | Freq: Every day | ORAL | 2 refills | Status: DC

## 2017-05-17 NOTE — Assessment & Plan Note (Signed)
Monitor closely. Referral to surg if worsening. She agreed with plan

## 2017-05-17 NOTE — Assessment & Plan Note (Signed)
Well controlled. Consider cutting back on Metformin from 500 mg BID to daily. F/U in 3-4 months.

## 2017-05-17 NOTE — Assessment & Plan Note (Signed)
We will monitor closely. If it increases in size, I will refer to an eye doctor. She agreed with plan.

## 2017-05-17 NOTE — Assessment & Plan Note (Addendum)
Stable on current regimen. Add Aspirin to regimen.

## 2017-05-17 NOTE — Progress Notes (Signed)
Subjective:     Patient ID: Betty Chapman, female   DOB: 1960/02/08, 57 y.o.   MRN: [id number]  HPI DM2:Compliant with Metformin. She is here for f/u. HTN/HLD:Compliant with her meds. On Zetia and fish oil for lipids. Denies any concern today. Depression: here for follow-up. She has been off meds for sometime. She self d/c meds. Now she is dealing with a divorce and poor relationship between her and her daughter which makes things worse. She does not want to get back on Prozac but will like to try a different medication. Lesions: Small bump on the left side of her eyelid and a knot on her left arm for sometime now. She denies pain, no change in size recently.  NG:EXBMWUX PAP.  Current Outpatient Medications on File Prior to Visit  Medication Sig Dispense Refill  . albuterol (PROVENTIL HFA;VENTOLIN HFA) 108 (90 Base) MCG/ACT inhaler Inhale 2 puffs into the lungs every 6 (six) hours as needed for wheezing or shortness of breath. 3 Inhaler 1  . albuterol (PROVENTIL) (2.5 MG/3ML) 0.083% nebulizer solution Take 3 mLs (2.5 mg total) by nebulization every 6 (six) hours as needed for wheezing or shortness of breath. 75 mL 3  . ezetimibe (ZETIA) 10 MG tablet TAKE 1 TABLET BY MOUTH  DAILY 90 tablet 1  . metFORMIN (GLUCOPHAGE) 500 MG tablet TAKE 1 TABLET BY MOUTH TWO  TIMES DAILY WITH A MEAL 180 tablet 1  . mometasone-formoterol (DULERA) 200-5 MCG/ACT AERO Inhale 2 puffs into the lungs 2 (two) times daily. 13 g 2  . tiotropium (SPIRIVA HANDIHALER) 18 MCG inhalation capsule Place 1 capsule (18 mcg total) into inhaler and inhale daily. 90 capsule 1  . FLUoxetine (PROZAC) 20 MG tablet Take 1 tablet (20 mg total) by mouth daily. (Patient not taking: Reported on 11/23/2016) 30 tablet 1  . ibuprofen (ADVIL,MOTRIN) 600 MG tablet Take 1 tablet (600 mg total) by mouth every 8 (eight) hours as needed. (Patient not taking: Reported on 08/13/2014) 30 tablet 0  . lisinopril (PRINIVIL,ZESTRIL) 5 MG tablet TAKE 1  TABLET BY MOUTH  DAILY (Patient not taking: Reported on 05/17/2017) 90 tablet 1  . pantoprazole (PROTONIX) 40 MG tablet Take 40 mg by mouth daily.    . ranitidine (ZANTAC) 150 MG capsule Take 1 capsule (150 mg total) by mouth 2 (two) times daily. (Patient not taking: Reported on 05/17/2017) 180 capsule 1   No current facility-administered medications on file prior to visit.    Past Medical History:  Diagnosis Date  . Blurry vision, bilateral 03/20/2014  . COPD (chronic obstructive pulmonary disease) ([place])   . Hypercholesterolemia   . Hypertension   . TIA (transient ischemic attack)    Once  . Tobacco abuse    Vitals:   05/17/17 0837  BP: 124/70  Pulse: (!) 57  Temp: 97.6 F (36.4 C)  TempSrc: Oral  SpO2: 99%  Weight: 157 lb (71.2 kg)  Height: 5' (1.524 m)     Review of Systems  Respiratory: Negative.   Cardiovascular: Negative.   Gastrointestinal: Negative.   Skin:       Bump on skin  Psychiatric/Behavioral: Negative for suicidal ideas.       Depress   All other systems reviewed and are negative.      Objective:   Physical Exam  Constitutional: She appears well-developed. No distress.  Cardiovascular: Normal rate, regular rhythm and normal heart sounds.  No murmur heard. Pulmonary/Chest: Effort normal and breath sounds normal. No respiratory distress. She has  no wheezes.  Abdominal: Soft. There is no tenderness.  Musculoskeletal: Normal range of motion. She exhibits no edema.  Skin:     Psychiatric: Her speech is normal and behavior is normal. Thought content normal. She exhibits a depressed mood.  Teary while talking about her daughter  Nursing note and vitals reviewed.      Assessment:     DM2 HTN HLD Depression Cystic lesion on face Lipoma of arm    Plan:     Check problem list.  PAP discussed. She prefer to check every 5 yrs instead of every 3 years.

## 2017-05-17 NOTE — Assessment & Plan Note (Signed)
Stable on current regimen. Recheck in 4 months

## 2017-05-17 NOTE — Assessment & Plan Note (Addendum)
Start Zoloft. Escribed Zoloft. I recommended CBT, she declined. She stated she is dealing with it well. Return in 4 weeks for reassessment.

## 2017-05-17 NOTE — Patient Instructions (Signed)

## 2017-06-14 ENCOUNTER — Other Ambulatory Visit: Payer: Self-pay

## 2017-06-14 ENCOUNTER — Encounter: Payer: Self-pay | Admitting: Family Medicine

## 2017-06-14 ENCOUNTER — Ambulatory Visit (INDEPENDENT_AMBULATORY_CARE_PROVIDER_SITE_OTHER): Payer: Medicare Other | Admitting: Family Medicine

## 2017-06-14 DIAGNOSIS — F39 Unspecified mood [affective] disorder: Secondary | ICD-10-CM

## 2017-06-14 DIAGNOSIS — L739 Follicular disorder, unspecified: Secondary | ICD-10-CM | POA: Diagnosis not present

## 2017-06-14 DIAGNOSIS — D1720 Benign lipomatous neoplasm of skin and subcutaneous tissue of unspecified limb: Secondary | ICD-10-CM | POA: Diagnosis not present

## 2017-06-14 MED ORDER — TETANUS-DIPHTH-ACELL PERTUSSIS 5-2-15.5 LF-MCG/0.5 IM SUSP: 0.5000 mL | Freq: Once | INTRAMUSCULAR | 0 refills | Status: AC

## 2017-06-14 NOTE — Assessment & Plan Note (Signed)
Stable off med. Monitor.

## 2017-06-14 NOTE — Patient Instructions (Signed)
Lipoma A lipoma is a noncancerous (benign) tumor that is made up of fat cells. This is a very common type of soft-tissue growth. Lipomas are usually found under the skin (subcutaneous). They may occur in any tissue of the body that contains fat. Common areas for lipomas to appear include the back, shoulders, buttocks, and thighs. Lipomas grow slowly, and they are usually painless. Most lipomas do not cause problems and do not require treatment. What are the causes? The cause of this condition is not known. What increases the risk? This condition is more likely to develop in:  People who are 40-60 years old.  People who have a family history of lipomas.  What are the signs or symptoms? A lipoma usually appears as a small, round bump under the skin. It may feel soft or rubbery, but the firmness can vary. Most lipomas are not painful. However, a lipoma may become painful if it is located in an area where it pushes on nerves. How is this diagnosed? A lipoma can usually be diagnosed with a physical exam. You may also have tests to confirm the diagnosis and to rule out other conditions. Tests may include:  Imaging tests, such as a CT scan or MRI.  Removal of a tissue sample to be looked at under a microscope (biopsy).  How is this treated? Treatment is not needed for small lipomas that are not causing problems. If a lipoma continues to get bigger or it causes problems, removal is often the best option. Lipomas can also be removed to improve appearance. Removal of a lipoma is usually done with a surgery in which the fatty cells and the surrounding capsule are removed. Most often, a medicine that numbs the area (local anesthetic) is used for this procedure. Follow these instructions at home:  Keep all follow-up visits as directed by your health care provider. This is important. Contact a health care provider if:  Your lipoma becomes larger or hard.  Your lipoma becomes painful, red, or  increasingly swollen. These could be signs of infection or a more serious condition. This information is not intended to replace advice given to you by your health care provider. Make sure you discuss any questions you have with your health care provider. Document Released: 12/11/2001 Document Revised: 05/29/2015 Document Reviewed: 12/17/2013 Elsevier Interactive Patient Education  2018 Elsevier Inc.  

## 2017-06-14 NOTE — Assessment & Plan Note (Signed)
On navel. Topical a/b recommended. She stated she has triple antibiotic at home and will use that. Keep area clean and dry. Call if worsening.

## 2017-06-14 NOTE — Progress Notes (Signed)
Subjective:     Patient ID: Betty Chapman, female   DOB: May 09, 1960, 57 y.o.   MRN: [id number]  HPI Knot on skin: Here to follow-up on the knot on her left arm. She now have similar knot developing on her right forearm. She denies pain, no redness, no itching.No trauma to the area. Navel lesion: Here to f/u with red spot on her navel, which started off with a small itchy bump. She used OTC hydrocortisone cream on it with some improvement. Denies fever. No pus from the area. Depression:She stated she did not start her Zoloft and she feels well. She does not want to start antidepressant for now. NO:IBBC Tdap and Pneumonia shot.  Current Outpatient Medications on File Prior to Visit  Medication Sig Dispense Refill  . aspirin 81 MG tablet Take 1 tablet (81 mg total) by mouth daily. 30 tablet 12  . ezetimibe (ZETIA) 10 MG tablet TAKE 1 TABLET BY MOUTH  DAILY 90 tablet 1  . lisinopril (PRINIVIL,ZESTRIL) 5 MG tablet TAKE 1 TABLET BY MOUTH  DAILY 90 tablet 1  . metFORMIN (GLUCOPHAGE) 500 MG tablet TAKE 1 TABLET BY MOUTH TWO  TIMES DAILY WITH A MEAL 180 tablet 1  . mometasone-formoterol (DULERA) 200-5 MCG/ACT AERO Inhale 2 puffs into the lungs 2 (two) times daily. 13 g 2  . tiotropium (SPIRIVA HANDIHALER) 18 MCG inhalation capsule Place 1 capsule (18 mcg total) into inhaler and inhale daily. 90 capsule 1  . albuterol (PROVENTIL HFA;VENTOLIN HFA) 108 (90 Base) MCG/ACT inhaler Inhale 2 puffs into the lungs every 6 (six) hours as needed for wheezing or shortness of breath. (Patient not taking: Reported on 06/14/2017) 3 Inhaler 1  . albuterol (PROVENTIL) (2.5 MG/3ML) 0.083% nebulizer solution Take 3 mLs (2.5 mg total) by nebulization every 6 (six) hours as needed for wheezing or shortness of breath. (Patient not taking: Reported on 06/14/2017) 75 mL 3  . ibuprofen (ADVIL,MOTRIN) 600 MG tablet Take 1 tablet (600 mg total) by mouth every 8 (eight) hours as needed. (Patient not taking: Reported on 08/13/2014)  30 tablet 0  . pantoprazole (PROTONIX) 40 MG tablet Take 40 mg by mouth daily.    . ranitidine (ZANTAC) 150 MG capsule Take 1 capsule (150 mg total) by mouth 2 (two) times daily. (Patient not taking: Reported on 05/17/2017) 180 capsule 1  . sertraline (ZOLOFT) 50 MG tablet Take 1 tablet (50 mg total) by mouth daily. (Patient not taking: Reported on 06/14/2017) 30 tablet 2   No current facility-administered medications on file prior to visit.    Past Medical History:  Diagnosis Date  . Blurry vision, bilateral 03/20/2014  . COPD (chronic obstructive pulmonary disease) ([place])   . Hypercholesterolemia   . Hypertension   . TIA (transient ischemic attack)    Once  . Tobacco abuse      Review of Systems  Respiratory: Negative.   Cardiovascular: Negative.   Gastrointestinal: Negative.   Musculoskeletal: Negative.   Skin:       Bump on skin  Neurological: Negative.   All other systems reviewed and are negative.      Objective:   Physical Exam  Constitutional: She appears well-developed. No distress.  Cardiovascular: Normal rate, regular rhythm and normal heart sounds.  No murmur heard. Pulmonary/Chest: Effort normal and breath sounds normal. No stridor. No respiratory distress.  Abdominal: Soft. Bowel sounds are normal. She exhibits no distension. There is no tenderness.  Small, mildly erythematous solitary papules on her navel. No tenderness, no discharge  Skin:  Firm, non-tender, mobile, round subcutaneous mass of her left arm measuring 2 cm by 2.5 cm. Similar but smaller mass on her right forearm dorsally.   Psychiatric: She has a normal mood and affect. Her speech is normal and behavior is normal. Judgment and thought content normal. Cognition and memory are normal. She expresses no homicidal and no suicidal ideation. She expresses no suicidal plans and no homicidal plans.  Nursing note and vitals reviewed.      Assessment:     Lipoma Navel folliculitis Depression Health  maintenance    Plan:     Check problem list. She declined pneumonia shot. She will take Tdap. I escribed prescription to her pharmacy.

## 2017-06-14 NOTE — Assessment & Plan Note (Signed)
New lesion since last visit. Surgical management discussed. She declined referral at this point. Will monitor.

## 2017-06-21 ENCOUNTER — Other Ambulatory Visit: Payer: Self-pay | Admitting: Family Medicine

## 2017-06-21 MED ORDER — EZETIMIBE 10 MG PO TABS
10.0000 mg | ORAL_TABLET | Freq: Every day | ORAL | 2 refills | Status: DC
Start: 2017-06-21 — End: 2018-06-05

## 2017-08-03 ENCOUNTER — Other Ambulatory Visit: Payer: Self-pay | Admitting: Family Medicine

## 2017-09-09 ENCOUNTER — Encounter: Payer: Self-pay | Admitting: Family Medicine

## 2017-09-09 ENCOUNTER — Other Ambulatory Visit: Payer: Self-pay

## 2017-09-09 ENCOUNTER — Ambulatory Visit (INDEPENDENT_AMBULATORY_CARE_PROVIDER_SITE_OTHER): Payer: Medicare Other | Admitting: Family Medicine

## 2017-09-09 VITALS — BP 124/68 | HR 77 | Temp 98.3°F | Ht 60.0 in | Wt 156.0 lb

## 2017-09-09 DIAGNOSIS — Z716 Tobacco abuse counseling: Secondary | ICD-10-CM

## 2017-09-09 DIAGNOSIS — E2839 Other primary ovarian failure: Secondary | ICD-10-CM | POA: Diagnosis not present

## 2017-09-09 DIAGNOSIS — M25559 Pain in unspecified hip: Secondary | ICD-10-CM | POA: Insufficient documentation

## 2017-09-09 DIAGNOSIS — M25551 Pain in right hip: Secondary | ICD-10-CM

## 2017-09-09 DIAGNOSIS — R0602 Shortness of breath: Secondary | ICD-10-CM | POA: Diagnosis not present

## 2017-09-09 DIAGNOSIS — J449 Chronic obstructive pulmonary disease, unspecified: Secondary | ICD-10-CM

## 2017-09-09 DIAGNOSIS — E785 Hyperlipidemia, unspecified: Secondary | ICD-10-CM | POA: Diagnosis not present

## 2017-09-09 DIAGNOSIS — J441 Chronic obstructive pulmonary disease with (acute) exacerbation: Secondary | ICD-10-CM

## 2017-09-09 DIAGNOSIS — E118 Type 2 diabetes mellitus with unspecified complications: Secondary | ICD-10-CM | POA: Diagnosis not present

## 2017-09-09 DIAGNOSIS — I10 Essential (primary) hypertension: Secondary | ICD-10-CM | POA: Diagnosis not present

## 2017-09-09 DIAGNOSIS — Z1239 Encounter for other screening for malignant neoplasm of breast: Secondary | ICD-10-CM

## 2017-09-09 DIAGNOSIS — Z1231 Encounter for screening mammogram for malignant neoplasm of breast: Secondary | ICD-10-CM

## 2017-09-09 LAB — POCT GLYCOSYLATED HEMOGLOBIN (HGB A1C): HbA1c, POC (controlled diabetic range): 6.3 % (ref 0.0–7.0)

## 2017-09-09 MED ORDER — AZITHROMYCIN 250 MG PO TABS: ORAL_TABLET | ORAL | 0 refills | Status: DC

## 2017-09-09 NOTE — Assessment & Plan Note (Signed)
Likely DJD. She declined pain medication for now. Consider imaging in the future if it persists or worsens.

## 2017-09-09 NOTE — Assessment & Plan Note (Addendum)
A1C goal. Continue current regimen. Foot exam completed. Podiatry evaluation recommended but she declined referral.

## 2017-09-09 NOTE — Progress Notes (Signed)
Subjective:     Patient ID: Betty Chapman, female   DOB: 1960/07/21, 57 y.o.   MRN: [id number]  Shortness of Breath  This is a new (2 months having SOB with exertion) problem. The problem occurs intermittently. The problem has been unchanged. The average episode lasts 2 months. Associated symptoms include sputum production. Pertinent negatives include no chest pain, fever, orthopnea, PND or wheezing. The symptoms are aggravated by exercise and weather changes. Associated symptoms comments: Coughs through the night. Risk factors include smoking (Now back to smoking 1 PPD). She has tried beta agonist inhalers and ipratropium inhalers for the symptoms. The treatment provided mild relief. Her past medical history is significant for COPD.  Hip Pain   The incident occurred 6 to 12 hours ago (Right hip pain worse this morning). There was no injury mechanism. The pain is present in the right hip. The quality of the pain is described as aching. The pain is at a severity of 3/10. The pain is mild. The pain has been fluctuating since onset. Pertinent negatives include no inability to bear weight, loss of motion, loss of sensation, muscle weakness, numbness or tingling. Associated symptoms comments: Goes down in her leg and feels like her leg is asleep. Nothing aggravates the symptoms. She has tried nothing for the symptoms. The treatment provided mild relief.  DM2:Compliant with Metformin 500 mg BID. SHe stated that she had a nurse come to her house and her A1C few weeks ago was 5.3 HTN/HLD:COmpliant with meds, here for f/u.  Current Outpatient Medications on File Prior to Visit  Medication Sig Dispense Refill  . albuterol (PROVENTIL HFA;VENTOLIN HFA) 108 (90 Base) MCG/ACT inhaler Inhale 2 puffs into the lungs every 6 (six) hours as needed for wheezing or shortness of breath. 3 Inhaler 1  . albuterol (PROVENTIL) (2.5 MG/3ML) 0.083% nebulizer solution Take 3 mLs (2.5 mg total) by nebulization every 6 (six)  hours as needed for wheezing or shortness of breath. 75 mL 3  . aspirin 81 MG tablet Take 1 tablet (81 mg total) by mouth daily. 30 tablet 12  . ezetimibe (ZETIA) 10 MG tablet Take 1 tablet (10 mg total) by mouth daily. 90 tablet 2  . lisinopril (PRINIVIL,ZESTRIL) 5 MG tablet TAKE 1 TABLET BY MOUTH  DAILY 90 tablet 1  . metFORMIN (GLUCOPHAGE) 500 MG tablet TAKE 1 TABLET BY MOUTH TWO  TIMES DAILY WITH A MEAL 180 tablet 1  . mometasone-formoterol (DULERA) 200-5 MCG/ACT AERO Inhale 2 puffs into the lungs 2 (two) times daily. 13 g 2  . tiotropium (SPIRIVA HANDIHALER) 18 MCG inhalation capsule Place 1 capsule (18 mcg total) into inhaler and inhale daily. 90 capsule 1  . ibuprofen (ADVIL,MOTRIN) 600 MG tablet Take 1 tablet (600 mg total) by mouth every 8 (eight) hours as needed. (Patient not taking: Reported on 08/13/2014) 30 tablet 0  . pantoprazole (PROTONIX) 40 MG tablet Take 40 mg by mouth daily.    . ranitidine (ZANTAC) 150 MG capsule Take 1 capsule (150 mg total) by mouth 2 (two) times daily. (Patient not taking: Reported on 05/17/2017) 180 capsule 1   No current facility-administered medications on file prior to visit.    Past Medical History:  Diagnosis Date  . Blurry vision, bilateral 03/20/2014  . COPD (chronic obstructive pulmonary disease) ([place])   . Hypercholesterolemia   . Hypertension   . TIA (transient ischemic attack)    Once  . Tobacco abuse    Vitals:   09/09/17 0836  BP: 124/68  Pulse: 77  Temp: 98.3 F (36.8 C)  TempSrc: Oral  SpO2: 99%  Weight: 156 lb (70.8 kg)  Height: 5' (1.524 m)      Review of Systems  Constitutional: Negative for fever.  Respiratory: Positive for sputum production and shortness of breath. Negative for wheezing.   Cardiovascular: Negative.  Negative for chest pain, orthopnea and PND.  Gastrointestinal: Negative.   Genitourinary: Negative.   Musculoskeletal: Positive for arthralgias.       Hip pain  Neurological: Negative.  Negative for  tingling and numbness.  All other systems reviewed and are negative.      Objective:   Physical Exam  Constitutional: She appears well-developed. She does not appear ill.  Pulmonary/Chest: Effort normal and breath sounds normal. No tachypnea and no bradypnea. No respiratory distress. She has no decreased breath sounds. She has no wheezes. She has no rhonchi. She has no rales.  Abdominal: Soft. She exhibits no ascites and no mass. There is no tenderness.  Musculoskeletal:       Right lower leg: She exhibits no edema.  Right hip pain  Neurological: She is alert. She is not disoriented. No cranial nerve deficit.  Nursing note and vitals reviewed.      Assessment:     COPD DM2 Hip HTN HLD    Plan:     Check problem list. She declined all vaccinations including flu, pneumonia and shingrix. Mammogram and Dexa recommended and she agreed to get them.

## 2017-09-09 NOTE — Assessment & Plan Note (Signed)
FLP checked today. Continue Statin.

## 2017-09-09 NOTE — Assessment & Plan Note (Signed)
BP looks ok. Bmet checked today.

## 2017-09-09 NOTE — Assessment & Plan Note (Signed)
Compliant with her meds. Has needed to take her Albuterol more due to cough and SOB. ?? Underlying infection given sputum production. Zithro prescribed. She requested that I send it to her mail in pharmacy. Continue home regimen. Check EChO to r/o cardiac component. F/U soon if there is no improvement. Smoking cessation discussed.

## 2017-09-09 NOTE — Assessment & Plan Note (Signed)
Brief counseling done. She will work on it.

## 2017-09-09 NOTE — Patient Instructions (Signed)

## 2017-09-10 LAB — LIPID PANEL
Chol/HDL Ratio: 3.5 ratio (ref 0.0–4.4)
Cholesterol, Total: 185 mg/dL (ref 100–199)
HDL: 53 mg/dL (ref >39)
LDL Calculated: 109 mg/dL — ABNORMAL HIGH (ref 0–99)
Triglycerides: 115 mg/dL (ref 0–149)
VLDL Cholesterol Cal: 23 mg/dL (ref 5–40)

## 2017-09-10 LAB — BASIC METABOLIC PANEL
BUN/Creatinine Ratio: 15 (ref 9–23)
BUN: 8 mg/dL (ref 6–24)
CO2: 24 mmol/L (ref 20–29)
Calcium: 9.7 mg/dL (ref 8.7–10.2)
Chloride: 103 mmol/L (ref 96–106)
Creatinine, Ser: 0.54 mg/dL — ABNORMAL LOW (ref 0.57–1.00)
GFR calc Af Amer: 121 mL/min/{1.73_m2} (ref >59)
GFR calc non Af Amer: 105 mL/min/{1.73_m2} (ref >59)
Glucose: 89 mg/dL (ref 65–99)
Potassium: 4.7 mmol/L (ref 3.5–5.2)
Sodium: 145 mmol/L — ABNORMAL HIGH (ref 134–144)

## 2017-09-11 ENCOUNTER — Encounter: Payer: Self-pay | Admitting: Family Medicine

## 2017-09-14 ENCOUNTER — Other Ambulatory Visit: Payer: Self-pay | Admitting: Family Medicine

## 2017-09-14 DIAGNOSIS — Z1231 Encounter for screening mammogram for malignant neoplasm of breast: Secondary | ICD-10-CM

## 2017-09-20 ENCOUNTER — Ambulatory Visit (HOSPITAL_COMMUNITY)
Admission: RE | Admit: 2017-09-20 | Discharge: 2017-09-20 | Disposition: A | Discharge status: Home / Self Care | Payer: Medicare Other | Source: Ambulatory Visit | Attending: Family Medicine | Admitting: Family Medicine

## 2017-09-20 DIAGNOSIS — E119 Type 2 diabetes mellitus without complications: Secondary | ICD-10-CM | POA: Diagnosis not present

## 2017-09-20 DIAGNOSIS — I10 Essential (primary) hypertension: Secondary | ICD-10-CM | POA: Diagnosis not present

## 2017-09-20 DIAGNOSIS — E785 Hyperlipidemia, unspecified: Secondary | ICD-10-CM | POA: Insufficient documentation

## 2017-09-20 DIAGNOSIS — J441 Chronic obstructive pulmonary disease with (acute) exacerbation: Secondary | ICD-10-CM | POA: Diagnosis present

## 2017-09-20 DIAGNOSIS — R0602 Shortness of breath: Secondary | ICD-10-CM | POA: Diagnosis present

## 2017-09-20 NOTE — Progress Notes (Signed)
  Echocardiogram 2D Echocardiogram has been performed.  Merrie Roof F 09/20/2017, 4:06 PM

## 2017-09-21 ENCOUNTER — Telehealth: Payer: Self-pay | Admitting: Family Medicine

## 2017-09-21 NOTE — Telephone Encounter (Signed)
ECHO report discussed with her. Still having occasional SOB. + Diastolic dysfunction. No pedal swelling. Continue ACEi for BP control. No need for diuretics. Hold beta blocker for now since BP is well controlled. Consider cardiology referral in the future. Continue ASA and Statin. She verbalized understanding and agreed with the plan. F/U with me in 1-2 weeks.

## 2017-09-22 ENCOUNTER — Other Ambulatory Visit: Payer: Self-pay | Admitting: Family Medicine

## 2017-10-11 ENCOUNTER — Ambulatory Visit (INDEPENDENT_AMBULATORY_CARE_PROVIDER_SITE_OTHER): Payer: Medicare Other | Admitting: Family Medicine

## 2017-10-11 ENCOUNTER — Encounter: Payer: Self-pay | Admitting: Family Medicine

## 2017-10-11 ENCOUNTER — Other Ambulatory Visit: Payer: Self-pay

## 2017-10-11 DIAGNOSIS — I5020 Unspecified systolic (congestive) heart failure: Secondary | ICD-10-CM | POA: Insufficient documentation

## 2017-10-11 DIAGNOSIS — J449 Chronic obstructive pulmonary disease, unspecified: Secondary | ICD-10-CM

## 2017-10-11 DIAGNOSIS — I5030 Unspecified diastolic (congestive) heart failure: Secondary | ICD-10-CM | POA: Diagnosis not present

## 2017-10-11 NOTE — Assessment & Plan Note (Addendum)
Stable. Smoking counseling done again. She is cutting back gradually. Continue current home regimen. Repeat PFT with Dr. Valentina Lucks.

## 2017-10-11 NOTE — Patient Instructions (Signed)

## 2017-10-11 NOTE — Assessment & Plan Note (Addendum)
New CHF diagnosis without exacerbation. ECHO shows G2DD. Currently asymptomatic. As discussed with her, we will need to optimize BP and DM2 management. Continue Statin and ASA. Return precaution discussed. Monitor closely.

## 2017-10-11 NOTE — Progress Notes (Signed)
  Subjective:     Patient ID: Betty Chapman, female   DOB: Mar 07, 1960, 57 y.o.   MRN: [id number]  HPI COPD:Compliant with her meds, here for f/u. Leg edema/SOB: Leg swelling improved. SOB intermittent but better. She completed A/B treatment. Cough has resolved. No new concern today.  Current Outpatient Medications on File Prior to Visit  Medication Sig Dispense Refill  . aspirin 81 MG tablet Take 1 tablet (81 mg total) by mouth daily. 30 tablet 12  . DULERA 200-5 MCG/ACT AERO USE 2 PUFFS TWO TIMES DAILY 39 g 3  . ezetimibe (ZETIA) 10 MG tablet Take 1 tablet (10 mg total) by mouth daily. 90 tablet 2  . lisinopril (PRINIVIL,ZESTRIL) 5 MG tablet TAKE 1 TABLET BY MOUTH  DAILY 90 tablet 1  . metFORMIN (GLUCOPHAGE) 500 MG tablet TAKE 1 TABLET BY MOUTH TWO  TIMES DAILY WITH A MEAL 180 tablet 1  . tiotropium (SPIRIVA HANDIHALER) 18 MCG inhalation capsule Place 1 capsule (18 mcg total) into inhaler and inhale daily. 90 capsule 1  . albuterol (PROVENTIL HFA;VENTOLIN HFA) 108 (90 Base) MCG/ACT inhaler Inhale 2 puffs into the lungs every 6 (six) hours as needed for wheezing or shortness of breath. (Patient not taking: Reported on 10/11/2017) 3 Inhaler 1  . albuterol (PROVENTIL) (2.5 MG/3ML) 0.083% nebulizer solution Take 3 mLs (2.5 mg total) by nebulization every 6 (six) hours as needed for wheezing or shortness of breath. (Patient not taking: Reported on 10/11/2017) 75 mL 3  . ibuprofen (ADVIL,MOTRIN) 600 MG tablet Take 1 tablet (600 mg total) by mouth every 8 (eight) hours as needed. (Patient not taking: Reported on 08/13/2014) 30 tablet 0  . pantoprazole (PROTONIX) 40 MG tablet Take 40 mg by mouth daily.     No current facility-administered medications on file prior to visit.    Past Medical History:  Diagnosis Date  . Blurry vision, bilateral 03/20/2014  . COPD (chronic obstructive pulmonary disease) ([place])   . Hypercholesterolemia   . Hypertension   . TIA (transient ischemic attack)    Once  .  Tobacco abuse    Vitals:   10/11/17 0857  BP: 114/74  Pulse: 74  Temp: 97.7 F (36.5 C)  TempSrc: Oral  SpO2: 95%  Weight: 154 lb (69.9 kg)  Height: 5' (1.524 m)     Review of Systems  Respiratory: Positive for shortness of breath.   Cardiovascular: Negative.   Gastrointestinal: Negative.   Neurological: Negative.   All other systems reviewed and are negative.      Objective:   Physical Exam  Constitutional: She appears well-developed. No distress.  Cardiovascular: Normal rate, regular rhythm and normal heart sounds.  No murmur heard. Pulmonary/Chest: Effort normal and breath sounds normal. No stridor. No respiratory distress. She has no wheezes.  Abdominal: Soft. Bowel sounds are normal. She exhibits no distension. There is no tenderness.  Musculoskeletal: She exhibits no edema.  Nursing note and vitals reviewed.      Assessment:     COPD HFpEF    Plan:     Check problem list.

## 2017-11-09 ENCOUNTER — Ambulatory Visit
Admission: RE | Admit: 2017-11-09 | Discharge: 2017-11-09 | Disposition: A | Discharge status: Home / Self Care | Payer: Medicare Other | Source: Ambulatory Visit | Attending: Family Medicine | Admitting: Family Medicine

## 2017-11-09 ENCOUNTER — Telehealth: Payer: Self-pay | Admitting: Family Medicine

## 2017-11-09 DIAGNOSIS — Z1231 Encounter for screening mammogram for malignant neoplasm of breast: Secondary | ICD-10-CM | POA: Diagnosis not present

## 2017-11-09 DIAGNOSIS — Z780 Asymptomatic menopausal state: Secondary | ICD-10-CM | POA: Diagnosis not present

## 2017-11-09 DIAGNOSIS — M85851 Other specified disorders of bone density and structure, right thigh: Secondary | ICD-10-CM | POA: Diagnosis not present

## 2017-11-09 DIAGNOSIS — E2839 Other primary ovarian failure: Secondary | ICD-10-CM

## 2017-11-09 NOTE — Telephone Encounter (Signed)
Bone Density result discussed with her. May initiate OscalD for prophylaxis, otherwise, normal result.

## 2017-11-10 ENCOUNTER — Ambulatory Visit (INDEPENDENT_AMBULATORY_CARE_PROVIDER_SITE_OTHER): Payer: Medicare Other | Admitting: Pharmacist

## 2017-11-10 ENCOUNTER — Encounter: Payer: Self-pay | Admitting: Pharmacist

## 2017-11-10 VITALS — BP 152/92 | HR 76 | Ht 60.0 in | Wt 159.6 lb

## 2017-11-10 DIAGNOSIS — J449 Chronic obstructive pulmonary disease, unspecified: Secondary | ICD-10-CM

## 2017-11-10 DIAGNOSIS — Z23 Encounter for immunization: Secondary | ICD-10-CM

## 2017-11-10 DIAGNOSIS — F17200 Nicotine dependence, unspecified, uncomplicated: Secondary | ICD-10-CM

## 2017-11-10 NOTE — Progress Notes (Signed)
Patient ID: Betty Chapman, female   DOB: 08-Dec-1960, 57 y.o.   MRN: [id number] Reviewed: Agree with Dr. Graylin Shiver documentation and management.

## 2017-11-10 NOTE — Progress Notes (Signed)
   S:    Patient arrives in good spirits, ambulating without assistance.  Presents for lung function evaluation (previously performed in 2016).   Patient was referred at last visit with PCP, Dr.  [name], on  10/11/2017.  Patient reports breathing has been problematic including dyspnea with exertion.  She notices she needs to stop walking to catch her breath when walking by herself.   Patient reports adherence to medications.  Patient reports last dose of COPD medications was this AM when she took her Dulera (with LABA).  Current COPD medications: Dulera, Spiriva, Albuterol PRN has MDI and Neb Rescue inhaler use frequency: Minimal recently.  Patient exacerbation hx: Reports one episode of dyspnea requiring antibiotics within the last year.   O: Physical Exam  Constitutional: She appears well-developed and well-nourished.  Pulmonary/Chest: Effort normal.  Psychiatric: She has a normal mood and affect.  Vitals reviewed.    Review of Systems  Respiratory: Positive for cough.   All other systems reviewed and are negative.    Vitals:   11/10/17 1109  BP: (!) 152/92  Pulse: 76  SpO2: 98%    mMRC score= 2 CAT score= 19 See Documentation Flowsheet - CAT/COPD for complete symptom scoring.  See "scanned report" or Documentation Flowsheet (discrete results - PFTs) for Spirometry results. Patient provided good effort while attempting spirometry.   Lung Age = 98 Albuterol Neb  Lot# [id number]     Exp. 05/04/2019  A/P: Patient has been experiencing shortness of breath with exertion and productive cough for "quite some time".  She reports taking Dulera and Spiriva.  She notes that her breathing is much improved when taking these medications.   Spirometry evaluation with pre- and post-bronchodilator reveals Severe obstruction with reversibility of ~12.8% (She did take her Dulera this AM) indicative of "reversible" and obstructive lung disease (ACOS). She is highly symptomatic with a CAT score of  19. Her exacerbation history in the last year is minimal.    -Reviewed results of pulmonary function tests.  Pt verbalized understanding of results and education.   Moderate tobacco abuse long-standing (started at age 25). Maximal daily intake was 1 ppd.  Currently attempting to reduce intake (reduced to 10 cigs per day).  She is targeting a reduction in cigarette intake through trigger reduction including meals, first cigarette with coffee and plan next to work on avoiding smoking in the car.  Not interested in tobacco cessation medication therapy at this time (specifically does NOT want patches).   Following significant discussion, patient was willing to get both Flu and Influenza vaccines to day.     Written pt instructions provided.  F/U Clinic visit PRN.    Total time in face to face counseling/evaluation 55 minutes.  Patient seen with Harrietta Guardian, PharmD.   [name]

## 2017-11-10 NOTE — Assessment & Plan Note (Signed)
Patient has been experiencing shortness of breath with exertion and productive cough for "quite some time".  She reports taking Dulera and Spiriva.  She notes that her breathing is much improved when taking these medications.   Spirometry evaluation with pre- and post-bronchodilator reveals Severe obstruction with reversibility of ~12.8% (She did take her Dulera this AM) indicative of "reversible" and obstructive lung disease (ACOS). She is highly symptomatic with a CAT score of 19. Her exacerbation history in the last year is minimal.    -Reviewed results of pulmonary function tests.  Pt verbalized understanding of results and education.   Following significant discussion, patient was willing to get both Flu and Influenza vaccines to day.

## 2017-11-10 NOTE — Patient Instructions (Signed)
Great to see you again.   Please continue to work hard on cutting down on smoking.  I am happy to work with you if you would like to work on quitting smoking in the near future.   Your breathing test showed that you have severe COPD.   Continue all of your current inhalers at this time - no changes.   Thanks for your willingness to take both a Flu and Pneumonia shot today.

## 2017-11-10 NOTE — Assessment & Plan Note (Signed)
Moderate tobacco abuse long-standing (started at age 57). Maximal daily intake was 1 ppd.  Currently attempting to reduce intake (reduced to 10 cigs per day).  She is targeting a reduction in cigarette intake through trigger reduction including meals, first cigarette with coffee and plan next to work on avoiding smoking in the car.  Not interested in tobacco cessation medication therapy at this time (specifically does NOT want patches).

## 2017-12-05 ENCOUNTER — Other Ambulatory Visit: Payer: Self-pay

## 2017-12-05 ENCOUNTER — Ambulatory Visit (INDEPENDENT_AMBULATORY_CARE_PROVIDER_SITE_OTHER): Payer: Medicare Other | Admitting: Family Medicine

## 2017-12-05 VITALS — BP 112/44 | HR 88 | Temp 98.2°F | Ht 60.0 in | Wt 162.1 lb

## 2017-12-05 DIAGNOSIS — J441 Chronic obstructive pulmonary disease with (acute) exacerbation: Secondary | ICD-10-CM

## 2017-12-05 MED ORDER — AZITHROMYCIN 250 MG PO TABS: ORAL_TABLET | ORAL | 0 refills | Status: DC

## 2017-12-05 MED ORDER — IPRATROPIUM BROMIDE 0.02 % IN SOLN
0.5000 mg | Freq: Once | RESPIRATORY_TRACT | Status: AC
  Administered 2017-12-05: 0.5 mg via RESPIRATORY_TRACT

## 2017-12-05 MED ORDER — IPRATROPIUM-ALBUTEROL 0.5-2.5 (3) MG/3ML IN SOLN: 3.0000 mL | Freq: Once | RESPIRATORY_TRACT | Status: DC

## 2017-12-05 MED ORDER — PREDNISONE 20 MG PO TABS: 40.0000 mg | ORAL_TABLET | Freq: Every day | ORAL | 0 refills | Status: DC

## 2017-12-05 MED ORDER — ALBUTEROL SULFATE (2.5 MG/3ML) 0.083% IN NEBU
2.5000 mg | INHALATION_SOLUTION | Freq: Once | RESPIRATORY_TRACT | Status: AC
  Administered 2017-12-05: 2.5 mg via RESPIRATORY_TRACT

## 2017-12-05 NOTE — Progress Notes (Signed)
Acute Office Visit  Subjective:    Patient ID: Betty Chapman, female    DOB: 1960-03-22, 57 y.o.   MRN: [id number]  Chief Complaint  Patient presents with  . Bronchitis  . Influenza    Patient feels that symptoms are similar to prior COPD exacerbations. Pt has had to use home nebulizer and rescue inhalers.   Cough  This is a new problem. Episode onset: past 4 days. The problem has been unchanged. The problem occurs every few minutes. The cough is productive of purulent sputum and productive of sputum. Associated symptoms include shortness of breath and wheezing. Pertinent negatives include no chest pain, chills, fever, headaches or rhinorrhea. The symptoms are aggravated by cold air and exercise. Treatments tried: home COPD inhalers. The treatment provided no relief. Her past medical history is significant for COPD.    Past Medical History:  Diagnosis Date  . Blurry vision, bilateral 03/20/2014  . COPD (chronic obstructive pulmonary disease) ([place])   . Hypercholesterolemia   . Hypertension   . TIA (transient ischemic attack)    Once  . Tobacco abuse     Past Surgical History:  Procedure Laterality Date  . BTL    . CHOLECYSTECTOMY    . COLONOSCOPY N/A 06/10/2014   Procedure: COLONOSCOPY;  Surgeon: Daneil Dolin, MD;  Location: [facility];  Service: Endoscopy;  Laterality: N/A;  1315  . ECTOPIC PREGNANCY SURGERY    . ESOPHAGOGASTRODUODENOSCOPY N/A 06/10/2014   Procedure: ESOPHAGOGASTRODUODENOSCOPY (EGD);  Surgeon: Daneil Dolin, MD;  Location: [facility];  Service: Endoscopy;  Laterality: N/A;  . Venia Minks DILATION N/A 06/10/2014   Procedure: Venia Minks DILATION;  Surgeon: Daneil Dolin, MD;  Location: [facility];  Service: Endoscopy;  Laterality: N/A;  . TUBAL LIGATION      Family History  Problem Relation Age of Onset  . Aneurysm Mother        brain, cause of death  . Cancer Brother        Dies of cancer, unsure what primary site  . Prostate cancer Brother   .  Colon cancer Neg Hx   . Breast cancer Neg Hx     Social History   Socioeconomic History  . Marital status: Divorced    Spouse name: Not on file  . Number of children: Not on file  . Years of education: Not on file  . Highest education level: Not on file  Occupational History  . Not on file  Social Needs  . Financial resource strain: Not on file  . Food insecurity:    Worry: Not on file    Inability: Not on file  . Transportation needs:    Medical: Not on file    Non-medical: Not on file  Tobacco Use  . Smoking status: Current Every Day Smoker    Packs/day: 0.50    Years: 47.00    Pack years: 23.50    Types: Cigarettes    Start date: 01/04/1970  . Smokeless tobacco: Never Used  . Tobacco comment: 1 pack per day max   Substance and Sexual Activity  . Alcohol use: No    Alcohol/week: 0.0 standard drinks    Comment: Rarely, none in the past year (as of 05/23/14)  . Drug use: Yes    Types: Marijuana    Comment: Occasionally  . Sexual activity: Yes    Birth control/protection: Surgical  Lifestyle  . Physical activity:    Days per week: Not on file  Minutes per session: Not on file  . Stress: Not on file  Relationships  . Social connections:    Talks on phone: Not on file    Gets together: Not on file    Attends religious service: Not on file    Active member of club or organization: Not on file    Attends meetings of clubs or organizations: Not on file    Relationship status: Not on file  . Intimate partner violence:    Fear of current or ex partner: Not on file    Emotionally abused: Not on file    Physically abused: Not on file    Forced sexual activity: Not on file  Other Topics Concern  . Not on file  Social History Narrative  . Not on file    Outpatient Medications Prior to Visit  Medication Sig Dispense Refill  . albuterol (PROVENTIL HFA;VENTOLIN HFA) 108 (90 Base) MCG/ACT inhaler Inhale 2 puffs into the lungs every 6 (six) hours as needed for wheezing  or shortness of breath. 3 Inhaler 1  . albuterol (PROVENTIL) (2.5 MG/3ML) 0.083% nebulizer solution Take 3 mLs (2.5 mg total) by nebulization every 6 (six) hours as needed for wheezing or shortness of breath. 75 mL 3  . aspirin 81 MG tablet Take 1 tablet (81 mg total) by mouth daily. 30 tablet 12  . DULERA 200-5 MCG/ACT AERO USE 2 PUFFS TWO TIMES DAILY 39 g 3  . ezetimibe (ZETIA) 10 MG tablet Take 1 tablet (10 mg total) by mouth daily. 90 tablet 2  . ibuprofen (ADVIL,MOTRIN) 600 MG tablet Take 1 tablet (600 mg total) by mouth every 8 (eight) hours as needed. (Patient not taking: Reported on 08/13/2014) 30 tablet 0  . lisinopril (PRINIVIL,ZESTRIL) 5 MG tablet TAKE 1 TABLET BY MOUTH  DAILY 90 tablet 1  . metFORMIN (GLUCOPHAGE) 500 MG tablet TAKE 1 TABLET BY MOUTH TWO  TIMES DAILY WITH A MEAL 180 tablet 1  . pantoprazole (PROTONIX) 40 MG tablet Take 40 mg by mouth daily.    . ranitidine (ZANTAC) 75 MG tablet Take 75 mg by mouth daily as needed for heartburn.    . tiotropium (SPIRIVA HANDIHALER) 18 MCG inhalation capsule Place 1 capsule (18 mcg total) into inhaler and inhale daily. 90 capsule 1   No facility-administered medications prior to visit.     Allergies  Allergen Reactions  . Mushroom Extract Complex Anaphylaxis and Rash  . Pravastatin Sodium Hives and Itching    Review of Systems  Constitutional: Negative for chills and fever.  HENT: Negative for rhinorrhea.   Respiratory: Positive for cough, shortness of breath and wheezing.   Cardiovascular: Negative for chest pain.  Neurological: Negative for headaches.  All other systems reviewed and are negative.      Objective:    Physical Exam  Constitutional: She is oriented to person, place, and time. She appears well-developed and well-nourished. No distress.  HENT:  Head: Normocephalic and atraumatic.  Eyes: No scleral icterus.  Neck: Neck supple.  Cardiovascular: Regular rhythm.  Pulmonary/Chest: No respiratory distress. She  has wheezes.  cough  Abdominal: Soft. She exhibits no distension.  Musculoskeletal: She exhibits no edema.  Neurological: She is alert and oriented to person, place, and time.  Skin: Skin is warm and dry.  Psychiatric: She has a normal mood and affect. Her behavior is normal.    BP (!) 112/44   Pulse 88   Temp 98.2 F (36.8 C) (Oral)   Ht 5' (1.524 m)  Wt 162 lb 2 oz (73.5 kg)   LMP 12/27/2011   SpO2 95%   BMI 31.66 kg/m  Wt Readings from Last 3 Encounters:  12/05/17 162 lb 2 oz (73.5 kg)  11/10/17 159 lb 9.6 oz (72.4 kg)  10/11/17 154 lb (69.9 kg)    Health Maintenance Due  Topic Date Due  . TETANUS/TDAP  01/05/2011  . OPHTHALMOLOGY EXAM  10/11/2017    There are no preventive care reminders to display for this patient.   Lab Results  Component Value Date   TSH 1.468 05/27/2009   Lab Results  Component Value Date   WBC 9.3 07/13/2014   HGB 15.7 (H) 07/13/2014   HCT 46.2 (H) 07/13/2014   MCV 93.3 07/13/2014   PLT 314 07/13/2014   Lab Results  Component Value Date   NA 145 (H) 09/09/2017   K 4.7 09/09/2017   CO2 24 09/09/2017   GLUCOSE 89 09/09/2017   BUN 8 09/09/2017   CREATININE 0.54 (L) 09/09/2017   BILITOT 0.5 07/13/2014   ALKPHOS 107 07/13/2014   AST 19 07/13/2014   ALT 17 07/13/2014   PROT 7.3 07/13/2014   ALBUMIN 4.1 07/13/2014   CALCIUM 9.7 09/09/2017   ANIONGAP 10 07/13/2014   Lab Results  Component Value Date   CHOL 185 09/09/2017   Lab Results  Component Value Date   HDL 53 09/09/2017   Lab Results  Component Value Date   LDLCALC 109 (H) 09/09/2017   Lab Results  Component Value Date   TRIG 115 09/09/2017   Lab Results  Component Value Date   CHOLHDL 3.5 09/09/2017   Lab Results  Component Value Date   HGBA1C 6.3 09/09/2017       Assessment & Plan:   Problem List Items Addressed This Visit      Respiratory   COPD exacerbation ([place]) - Primary    Patient with history of COPD presenting with COPD exacerbation.   Likely mild exacerbation.  Afebrile, diffuse wheeze.  -Given Atrovent, still having some wheeze, but no respiratory distress -5 days 40 mg prednisone -Azithromycin 500 mg, 250 mg for 4 days -Follow-up in 1 week -Return precautions given      Relevant Medications   predniSONE (DELTASONE) 20 MG tablet   azithromycin (ZITHROMAX) 250 MG tablet   albuterol (PROVENTIL) (2.5 MG/3ML) 0.083% nebulizer solution 2.5 mg (Completed)   ipratropium (ATROVENT) nebulizer solution 0.5 mg (Completed)       Meds ordered this encounter  Medications  . DISCONTD: ipratropium-albuterol (DUONEB) 0.5-2.5 (3) MG/3ML nebulizer solution 3 mL  . predniSONE (DELTASONE) 20 MG tablet    Sig: Take 2 tablets (40 mg total) by mouth daily with breakfast for 5 days.    Dispense:  10 tablet    Refill:  0  . azithromycin (ZITHROMAX) 250 MG tablet    Sig: Take two pill on day 1, and take 1 pill for 4 days after    Dispense:  6 each    Refill:  0  . albuterol (PROVENTIL) (2.5 MG/3ML) 0.083% nebulizer solution 2.5 mg  . ipratropium (ATROVENT) nebulizer solution 0.5 mg     Bonnita Hollow, MD

## 2017-12-05 NOTE — Assessment & Plan Note (Signed)
Patient with history of COPD presenting with COPD exacerbation.  Likely mild exacerbation.  Afebrile, diffuse wheeze.  -Given Atrovent, still having some wheeze, but no respiratory distress -5 days 40 mg prednisone -Azithromycin 500 mg, 250 mg for 4 days -Follow-up in 1 week -Return precautions given

## 2017-12-05 NOTE — Patient Instructions (Signed)
c Chronic Obstructive Pulmonary Disease Exacerbation Chronic obstructive pulmonary disease (COPD) is a common lung problem. In COPD, the flow of air from the lungs is limited. COPD exacerbations are times that breathing gets worse and you need extra treatment. Without treatment they can be life threatening. If they happen often, your lungs can become more damaged. If your COPD gets worse, your doctor may treat you with:  Medicines.  Oxygen.  Different ways to clear your airway, such as using a mask.  Follow these instructions at home:  Do not smoke.  Avoid tobacco smoke and other things that bother your lungs.  If given, take your antibiotic medicine as told. Finish the medicine even if you start to feel better.  Only take medicines as told by your doctor.  Drink enough fluids to keep your pee (urine) clear or pale yellow (unless your doctor has told you not to).  Use a cool mist machine (vaporizer).  If you use oxygen or a machine that turns liquid medicine into a mist (nebulizer), continue to use them as told.  Keep up with shots (vaccinations) as told by your doctor.  Exercise regularly.  Eat healthy foods.  Keep all doctor visits as told. Get help right away if:  You are very short of breath and it gets worse.  You have trouble talking.  You have bad chest pain.  You have blood in your spit (sputum).  You have a fever.  You keep throwing up (vomiting).  You feel weak, or you pass out (faint).  You feel confused.  You keep getting worse. This information is not intended to replace advice given to you by your health care provider. Make sure you discuss any questions you have with your health care provider. Document Released: 12/10/2010 Document Revised: 05/29/2015 Document Reviewed: 08/25/2012 Elsevier Interactive Patient Education  2017 [employer].

## 2017-12-13 ENCOUNTER — Telehealth: Payer: Self-pay | Admitting: *Deleted

## 2017-12-13 DIAGNOSIS — J441 Chronic obstructive pulmonary disease with (acute) exacerbation: Secondary | ICD-10-CM

## 2017-12-13 MED ORDER — PREDNISONE 20 MG PO TABS: 40.0000 mg | ORAL_TABLET | Freq: Every day | ORAL | 0 refills | Status: AC

## 2017-12-13 NOTE — Telephone Encounter (Signed)
Need to go to the provider who gave the prescription. Thanks.

## 2017-12-13 NOTE — Telephone Encounter (Signed)
Pt calls because the azithromycin and prednisone were sent to [facility] at her appt on 12/05/17.  She just received the azithromycin yesterday, but [facility] called and told her they did not have prednisone in stock and that it would need to be sent to a local pharmacy.    Pt is still coughing and congested.   I sent prednisone into [facility] as requested by pt and made followup appt with PCP on Friday.  Pt appreciative. Shamina Etheridge, Salome Spotted, CMA

## 2017-12-16 ENCOUNTER — Ambulatory Visit: Payer: Self-pay | Admitting: Family Medicine

## 2017-12-21 ENCOUNTER — Encounter: Payer: Self-pay | Admitting: Family Medicine

## 2017-12-23 ENCOUNTER — Other Ambulatory Visit: Payer: Self-pay

## 2017-12-23 ENCOUNTER — Encounter: Payer: Self-pay | Admitting: Family Medicine

## 2017-12-23 ENCOUNTER — Ambulatory Visit (INDEPENDENT_AMBULATORY_CARE_PROVIDER_SITE_OTHER): Payer: Medicare Other | Admitting: Family Medicine

## 2017-12-23 VITALS — BP 132/74 | HR 84 | Temp 97.8°F | Ht 60.0 in | Wt 160.0 lb

## 2017-12-23 DIAGNOSIS — J449 Chronic obstructive pulmonary disease, unspecified: Secondary | ICD-10-CM

## 2017-12-23 DIAGNOSIS — E1169 Type 2 diabetes mellitus with other specified complication: Secondary | ICD-10-CM

## 2017-12-23 DIAGNOSIS — R7309 Other abnormal glucose: Secondary | ICD-10-CM

## 2017-12-23 DIAGNOSIS — F17200 Nicotine dependence, unspecified, uncomplicated: Secondary | ICD-10-CM

## 2017-12-23 DIAGNOSIS — R5383 Other fatigue: Secondary | ICD-10-CM

## 2017-12-23 DIAGNOSIS — R053 Chronic cough: Secondary | ICD-10-CM

## 2017-12-23 DIAGNOSIS — J441 Chronic obstructive pulmonary disease with (acute) exacerbation: Secondary | ICD-10-CM

## 2017-12-23 DIAGNOSIS — R05 Cough: Secondary | ICD-10-CM

## 2017-12-23 LAB — POCT GLYCOSYLATED HEMOGLOBIN (HGB A1C): HbA1c, POC (controlled diabetic range): 6.9 % (ref 0.0–7.0)

## 2017-12-23 MED ORDER — BLOOD GLUCOSE MONITOR KIT: PACK | 0 refills | Status: DC

## 2017-12-23 MED ORDER — GLIPIZIDE 10 MG PO TABS: 10.0000 mg | ORAL_TABLET | Freq: Every day | ORAL | 1 refills | Status: DC

## 2017-12-23 NOTE — Assessment & Plan Note (Signed)
A1C < 7 which is goal. Compliant with Metformin. I discussed FDA investigation for cancer causing agents in Metformin. Options given to continue vs switch to something else. She will like to switch at this time per recommendation. Switch to Glipizide. Check CBG TID. Glucometer prescribed. Hypoglycemic symptoms discussed. Hold Glipizide if glucose is less than 80. She agreed with plan.

## 2017-12-23 NOTE — Assessment & Plan Note (Signed)
??   Stress or poor sleep. Hx of Mild sleep apnea. I referred her for sleep study again. TSH recommended. She will like to defer blood work till her next visit.

## 2017-12-23 NOTE — Patient Instructions (Signed)
Coping with Quitting Smoking  Quitting smoking is a physical and mental challenge. You will face cravings, withdrawal symptoms, and temptation. Before quitting, work with your health care provider to make a plan that can help you cope. Preparation can help you quit and keep you from giving in. How can I cope with cravings? Cravings usually last for 5-10 minutes. If you get through it, the craving will pass. Consider taking the following actions to help you cope with cravings:  Keep your mouth busy: ? Chew sugar-free gum. ? Suck on hard candies or a straw. ? Brush your teeth.  Keep your hands and body busy: ? Immediately change to a different activity when you feel a craving. ? Squeeze or play with a ball. ? Do an activity or a hobby, like making bead jewelry, practicing needlepoint, or working with wood. ? Mix up your normal routine. ? Take a short exercise break. Go for a quick walk or run up and down stairs. ? Spend time in public places where smoking is not allowed.  Focus on doing something kind or helpful for someone else.  Call a friend or family member to talk during a craving.  Join a support group.  Call a quit line, such as 1-800-QUIT-NOW.  Talk with your health care provider about medicines that might help you cope with cravings and make quitting easier for you. How can I deal with withdrawal symptoms? Your body may experience negative effects as it tries to get used to not having nicotine in the system. These effects are called withdrawal symptoms. They may include:  Feeling hungrier than normal.  Trouble concentrating.  Irritability.  Trouble sleeping.  Feeling depressed.  Restlessness and agitation.  Craving a cigarette. To manage withdrawal symptoms:  Avoid places, people, and activities that trigger your cravings.  Remember why you want to quit.  Get plenty of sleep.  Avoid coffee and other caffeinated drinks. These may worsen some of your symptoms.  How can I handle social situations? Social situations can be difficult when you are quitting smoking, especially in the first few weeks. To manage this, you can:  Avoid parties, bars, and other social situations where people might be smoking.  Avoid alcohol.  Leave right away if you have the urge to smoke.  Explain to your family and friends that you are quitting smoking. Ask for understanding and support.  Plan activities with friends or family where smoking is not an option. What are some ways I can cope with stress? Wanting to smoke may cause stress, and stress can make you want to smoke. Find ways to manage your stress. Relaxation techniques can help. For example:  Breathe slowly and deeply, in through your nose and out through your mouth.  Listen to soothing, relaxing music.  Talk with a family member or friend about your stress.  Light a candle.  Soak in a bath or take a shower.  Think about a peaceful place. What are some ways I can prevent weight gain? Be aware that many people gain weight after they quit smoking. However, not everyone does. To keep from gaining weight, have a plan in place before you quit and stick to the plan after you quit. Your plan should include:  Having healthy snacks. When you have a craving, it may help to: ? Eat plain popcorn, crunchy carrots, celery, or other cut vegetables. ? Chew sugar-free gum.  Changing how you eat: ? Eat small portion sizes at meals. ? Eat 4-6 small meals   throughout the day instead of 1-2 large meals a day. ? Be mindful when you eat. Do not watch television or do other things that might distract you as you eat.  Exercising regularly: ? Make time to exercise each day. If you do not have time for a long workout, do short bouts of exercise for 5-10 minutes several times a day. ? Do some form of strengthening exercise, like weight lifting, and some form of aerobic exercise, like running or swimming.  Drinking plenty of  water or other low-calorie or no-calorie drinks. Drink 6-8 glasses of water daily, or as much as instructed by your health care provider. Summary  Quitting smoking is a physical and mental challenge. You will face cravings, withdrawal symptoms, and temptation to smoke again. Preparation can help you as you go through these challenges.  You can cope with cravings by keeping your mouth busy (such as by chewing gum), keeping your body and hands busy, and making calls to family, friends, or a helpline for people who want to quit smoking.  You can cope with withdrawal symptoms by avoiding places where people smoke, avoiding drinks with caffeine, and getting plenty of rest.  Ask your health care provider about the different ways to prevent weight gain, avoid stress, and handle social situations. This information is not intended to replace advice given to you by your health care provider. Make sure you discuss any questions you have with your health care provider. Document Released: 12/19/2015 Document Revised: 12/19/2015 Document Reviewed: 12/19/2015 Elsevier Interactive Patient Education  2019 [employer]  

## 2017-12-23 NOTE — Assessment & Plan Note (Signed)
I commended her on her willingness to quit. Lung cancer screening recommended. CT ordered.

## 2017-12-23 NOTE — Assessment & Plan Note (Signed)
No acute change. Continue current regimen. Quit smoking.

## 2017-12-23 NOTE — Progress Notes (Signed)
Subjective:     Patient ID: Betty Chapman, female   DOB: [birth year], 57 y.o.   MRN: [id number]  HPI DM: She is compliant with her Metformin 500 mg BID. Here for f/u COPD: She continues to cough but no SOB, no fever, no sputum production. She is compliant with her Dulera and Albuterol. Smoking:She continues to smoke. She smoked for about 40 years or more, smoking since she was 57 years old. She has cut down to a pack every two days. She stated that the next time she comes in she would have quit smoking. Fatigue: Daily for about 2 months, but worsening. No dizziness, or fall. Appetite is good. She sleeps well at night except when she is coughing a lot. Denies apnea while sleeping at night. She endorsed occasional sweating even in the cold weather.  Current Outpatient Medications on File Prior to Visit  Medication Sig Dispense Refill  . albuterol (PROVENTIL HFA;VENTOLIN HFA) 108 (90 Base) MCG/ACT inhaler Inhale 2 puffs into the lungs every 6 (six) hours as needed for wheezing or shortness of breath. 3 Inhaler 1  . albuterol (PROVENTIL) (2.5 MG/3ML) 0.083% nebulizer solution Take 3 mLs (2.5 mg total) by nebulization every 6 (six) hours as needed for wheezing or shortness of breath. 75 mL 3  . aspirin 81 MG tablet Take 1 tablet (81 mg total) by mouth daily. 30 tablet 12  . DULERA 200-5 MCG/ACT AERO USE 2 PUFFS TWO TIMES DAILY 39 g 3  . ezetimibe (ZETIA) 10 MG tablet Take 1 tablet (10 mg total) by mouth daily. 90 tablet 2  . lisinopril (PRINIVIL,ZESTRIL) 5 MG tablet TAKE 1 TABLET BY MOUTH  DAILY 90 tablet 1  . metFORMIN (GLUCOPHAGE) 500 MG tablet TAKE 1 TABLET BY MOUTH TWO  TIMES DAILY WITH A MEAL 180 tablet 1  . tiotropium (SPIRIVA HANDIHALER) 18 MCG inhalation capsule Place 1 capsule (18 mcg total) into inhaler and inhale daily. 90 capsule 1  . ibuprofen (ADVIL,MOTRIN) 600 MG tablet Take 1 tablet (600 mg total) by mouth every 8 (eight) hours as needed. (Patient not taking: Reported on 08/13/2014)  30 tablet 0  . pantoprazole (PROTONIX) 40 MG tablet Take 40 mg by mouth daily.    . ranitidine (ZANTAC) 75 MG tablet Take 75 mg by mouth daily as needed for heartburn.     No current facility-administered medications on file prior to visit.    Past Medical History:  Diagnosis Date  . Blurry vision, bilateral 03/20/2014  . COPD (chronic obstructive pulmonary disease) ([place])   . Dysphagia, pharyngoesophageal phase   . Hypercholesterolemia   . Hypertension   . TIA (transient ischemic attack)    Once  . Tobacco abuse    Vitals:   12/23/17 0827  BP: 132/74  Pulse: 84  Temp: 97.8 F (36.6 C)  TempSrc: Oral  SpO2: 96%  Weight: 160 lb (72.6 kg)  Height: 5' (1.524 m)     Review of Systems  Respiratory: Positive for cough. Negative for apnea, choking, chest tightness, shortness of breath, wheezing and stridor.   Cardiovascular: Negative.   Gastrointestinal: Negative.   Genitourinary: Negative.   All other systems reviewed and are negative.      Objective:   Physical Exam Vitals signs and nursing note reviewed.  Constitutional:      Appearance: Normal appearance. She is not ill-appearing.  Neck:     Musculoskeletal: Normal range of motion.  Cardiovascular:     Rate and Rhythm: Regular rhythm.  Heart sounds: Normal heart sounds. No murmur.  Pulmonary:     Effort: Pulmonary effort is normal. No respiratory distress.     Breath sounds: No wheezing.     Comments: Left basal crackle, mild Abdominal:     General: Bowel sounds are normal. There is no distension.     Palpations: There is no mass.     Tenderness: There is no abdominal tenderness.  Musculoskeletal:        General: No swelling.  Neurological:     Mental Status: She is alert.        Assessment:     DM:  COPD:  Smoking: Fatigue:  Plan:     Check problem list  Glucometer RX faxed to [facility]

## 2017-12-29 ENCOUNTER — Ambulatory Visit (HOSPITAL_COMMUNITY)
Admission: RE | Admit: 2017-12-29 | Discharge: 2017-12-29 | Disposition: A | Discharge status: Home / Self Care | Payer: Medicare Other | Source: Ambulatory Visit | Attending: Family Medicine | Admitting: Family Medicine

## 2017-12-29 ENCOUNTER — Telehealth: Payer: Self-pay

## 2017-12-29 ENCOUNTER — Other Ambulatory Visit: Payer: Self-pay

## 2017-12-29 DIAGNOSIS — R053 Chronic cough: Secondary | ICD-10-CM

## 2017-12-29 DIAGNOSIS — F17200 Nicotine dependence, unspecified, uncomplicated: Secondary | ICD-10-CM | POA: Diagnosis not present

## 2017-12-29 DIAGNOSIS — R05 Cough: Secondary | ICD-10-CM | POA: Insufficient documentation

## 2017-12-29 MED ORDER — BLOOD GLUCOSE MONITOR KIT: PACK | 0 refills | Status: AC

## 2017-12-29 NOTE — Telephone Encounter (Signed)
Pt called nurse line stating she just spoke with [facility] and they have not received the blood glucose testing kit. Pt also wanted to know if she should start taking glipizide without testing her sugars.   I called pharmacy and they confirmed they received the prescription and should be delivered by next week.  I informed patient of this and informed her to not start the glipizide until she is able to check her sugars. Pt verbalized understanding.

## 2018-02-13 ENCOUNTER — Other Ambulatory Visit: Payer: Self-pay | Admitting: Family Medicine

## 2018-03-22 ENCOUNTER — Telehealth: Payer: Self-pay | Admitting: Family Medicine

## 2018-03-22 NOTE — Telephone Encounter (Signed)
Pt wanting to set up her f/u appt for her A1C for April. Told pt I would have Dr. [name] call her to discuss this since the appt may need to be pushed out another month but we would leave that up to Dr. [name]. Please call pt back to discuss this.

## 2018-03-22 NOTE — Telephone Encounter (Signed)
I spoke with Ms. Betty Chapman regarding her visit for DM check-up. She is due for A1C testing.  During her last visit, I started her on Glipizide and held Metformin because of potential carcinogen content. However, she stated that she d/c glipizide and restarted Metformin since her CBG was below 70 on Glipizide. So far, there is no confirmation regarding Metformin being carcinogenic. A February 2020 article reviewed stated that FDA recommended not recalling Metformin. She may continue for now, pending verification.  She will return late April or early May for her A1C check. All questions were answered.

## 2018-06-05 ENCOUNTER — Other Ambulatory Visit: Payer: Self-pay | Admitting: Family Medicine

## 2018-06-05 NOTE — Telephone Encounter (Signed)
Please help patient schedule in office appointment if not already made. She was last seen 6 months ago. Thanks.

## 2018-06-05 NOTE — Telephone Encounter (Signed)
LM for patient to call back and schedule a diabetes follow up .  Lourie Retz,CMA

## 2018-06-13 ENCOUNTER — Encounter: Payer: Self-pay | Admitting: Family Medicine

## 2018-06-13 ENCOUNTER — Ambulatory Visit (INDEPENDENT_AMBULATORY_CARE_PROVIDER_SITE_OTHER): Payer: Medicare Other | Admitting: Family Medicine

## 2018-06-13 ENCOUNTER — Other Ambulatory Visit: Payer: Self-pay

## 2018-06-13 VITALS — BP 140/80 | HR 79 | Ht 60.0 in | Wt 160.0 lb

## 2018-06-13 DIAGNOSIS — H5710 Ocular pain, unspecified eye: Secondary | ICD-10-CM | POA: Insufficient documentation

## 2018-06-13 DIAGNOSIS — E6609 Other obesity due to excess calories: Secondary | ICD-10-CM | POA: Diagnosis not present

## 2018-06-13 DIAGNOSIS — J441 Chronic obstructive pulmonary disease with (acute) exacerbation: Secondary | ICD-10-CM

## 2018-06-13 DIAGNOSIS — M25551 Pain in right hip: Secondary | ICD-10-CM | POA: Diagnosis not present

## 2018-06-13 DIAGNOSIS — I10 Essential (primary) hypertension: Secondary | ICD-10-CM | POA: Diagnosis not present

## 2018-06-13 DIAGNOSIS — Z888 Allergy status to other drugs, medicaments and biological substances status: Secondary | ICD-10-CM | POA: Diagnosis not present

## 2018-06-13 DIAGNOSIS — F39 Unspecified mood [affective] disorder: Secondary | ICD-10-CM

## 2018-06-13 DIAGNOSIS — Z9289 Personal history of other medical treatment: Secondary | ICD-10-CM | POA: Insufficient documentation

## 2018-06-13 DIAGNOSIS — E1169 Type 2 diabetes mellitus with other specified complication: Secondary | ICD-10-CM | POA: Diagnosis not present

## 2018-06-13 DIAGNOSIS — G4730 Sleep apnea, unspecified: Secondary | ICD-10-CM

## 2018-06-13 DIAGNOSIS — Z6831 Body mass index (BMI) 31.0-31.9, adult: Secondary | ICD-10-CM

## 2018-06-13 DIAGNOSIS — I5030 Unspecified diastolic (congestive) heart failure: Secondary | ICD-10-CM

## 2018-06-13 DIAGNOSIS — F17200 Nicotine dependence, unspecified, uncomplicated: Secondary | ICD-10-CM

## 2018-06-13 DIAGNOSIS — G8929 Other chronic pain: Secondary | ICD-10-CM

## 2018-06-13 DIAGNOSIS — E782 Mixed hyperlipidemia: Secondary | ICD-10-CM

## 2018-06-13 DIAGNOSIS — H5712 Ocular pain, left eye: Secondary | ICD-10-CM

## 2018-06-13 LAB — POCT GLYCOSYLATED HEMOGLOBIN (HGB A1C): HbA1c, POC (controlled diabetic range): 6.6 % (ref 0.0–7.0)

## 2018-06-13 NOTE — Assessment & Plan Note (Signed)
Benign eye exam. ?? Migraine related. No neurologic deficit or vision loss. Vision screening done today was fine. I will refer her to an ophthalmologist. ED visit recommended if it reoccurs or worsens.

## 2018-06-13 NOTE — Assessment & Plan Note (Signed)
No acute flare. Monitor closely.

## 2018-06-13 NOTE — Assessment & Plan Note (Signed)
BP looks fine. Continue current regimen.

## 2018-06-13 NOTE — Assessment & Plan Note (Signed)
Office Visit from 06/13/2018 in [place]  PHQ-9 Total Score  5     Doing well off meds. Monitor closely.

## 2018-06-13 NOTE — Assessment & Plan Note (Signed)
Likely DJD. Lumbar spine xray in 2012 shows DJD. I will obtain xray of her hip at this point. Continue Tylenol as needed for pain. I completed her DMV handicap form at her request.

## 2018-06-13 NOTE — Assessment & Plan Note (Signed)
On Zetia. She is unable to tolerate statin therapy. Continue Zetia with diet and exercise.

## 2018-06-13 NOTE — Assessment & Plan Note (Signed)
A1C looks good. Continue Metformin 500 mg BID. Recheck A1c in 4 months.

## 2018-06-13 NOTE — Progress Notes (Signed)
Subjective:     Patient ID: Betty Chapman, female   DOB: July 26, 1960, 58 y.o.   MRN: [id number]  HPI DM2:Here for f/u. She is compliant with her metformin 500 mg BID. She d/c Glipizide due to hypoglycemia. Home CBG has been good. HTN/HLD:Compliant with her Lisinopril 5 mg qd. Also on Zetia for hyperlipidemia. She is unable to tolerate Statin due to hives and itching. COPD/CHF: Compliant with her meds, denies SOB or leg swelling. She has chronic smokers cough that has not changed. Smoking: She is back to smoking 1 pack of cigarette per day. She has been stressed lately with COVID-19 situation. Eye pain: C/O Pain behind left eye for about 2-4 months, she denies any change in her vision, no discharge, the light only bothers her when it hurts. Pain occurs intermittently about 1-3 times per week and each episodes last a few min. No recent trauma to her face or eyes. She is currently asymptomatic. She stated that the pain is different from her Migraine eye pain. Hip Pain: Right hip pain persists for more than 1 year, progressively worsening. Pain is aggravated by activities such as ambulating. Pain radiates down her right leg. No recent trauma or fall. Obesity: She exercise as tolerated. She felt that she gained some weight earlier this year but has since lost weight.  Current Outpatient Medications on File Prior to Visit  Medication Sig Dispense Refill  . aspirin 81 MG tablet Take 1 tablet (81 mg total) by mouth daily. 30 tablet 12  . DULERA 200-5 MCG/ACT AERO USE 2 PUFFS TWO TIMES DAILY 39 g 3  . ezetimibe (ZETIA) 10 MG tablet TAKE 1 TABLET BY MOUTH  DAILY 90 tablet 0  . lisinopril (PRINIVIL,ZESTRIL) 5 MG tablet TAKE 1 TABLET BY MOUTH  DAILY 90 tablet 1  . metFORMIN (GLUCOPHAGE) 500 MG tablet TAKE 1 TABLET BY MOUTH TWO  TIMES DAILY WITH A MEAL 180 tablet 1  . Omega-3 Fatty Acids (FISH OIL) 1000 MG CAPS Take 1 capsule by mouth 2 (two) times daily.    [name] tiotropium (SPIRIVA HANDIHALER) 18 MCG  inhalation capsule Place 1 capsule (18 mcg total) into inhaler and inhale daily. 90 capsule 1  . ACCU-CHEK AVIVA PLUS test strip TEST UP TO 3 TIMES DAILY AS DIRECTED 300 each 0  . Accu-Chek Softclix Lancets lancets TEST UP TO 3 TIMES DAILY AS DIRECTED 300 each 0  . albuterol (PROVENTIL HFA;VENTOLIN HFA) 108 (90 Base) MCG/ACT inhaler Inhale 2 puffs into the lungs every 6 (six) hours as needed for wheezing or shortness of breath. (Patient not taking: Reported on 06/13/2018) 3 Inhaler 1  . albuterol (PROVENTIL) (2.5 MG/3ML) 0.083% nebulizer solution Take 3 mLs (2.5 mg total) by nebulization every 6 (six) hours as needed for wheezing or shortness of breath. (Patient not taking: Reported on 06/13/2018) 75 mL 3  . blood glucose meter kit and supplies KIT Accu-Chek Aviva Plus Use up to TID daily as directed. (FOR ICD-9 250.00, 250.01). 1 each 0  . glipiZIDE (GLUCOTROL) 10 MG tablet Take 1 tablet (10 mg total) by mouth daily. (Patient not taking: Reported on 06/13/2018) 90 tablet 1  . ibuprofen (ADVIL,MOTRIN) 600 MG tablet Take 1 tablet (600 mg total) by mouth every 8 (eight) hours as needed. (Patient not taking: Reported on 08/13/2014) 30 tablet 0  . pantoprazole (PROTONIX) 40 MG tablet Take 40 mg by mouth daily.     No current facility-administered medications on file prior to visit.    Past Medical History:  Diagnosis Date  . Blurry vision, bilateral 03/20/2014  . COPD (chronic obstructive pulmonary disease) ([place])   . Dysphagia, pharyngoesophageal phase   . Hypercholesterolemia   . Hypertension   . TIA (transient ischemic attack)    Once  . Tobacco abuse    Vitals:   06/13/18 0823  BP: 140/80  Pulse: 79  SpO2: 97%  Weight: 160 lb (72.6 kg)  Height: 5' (1.524 m)     Review of Systems  HENT: Negative.   Eyes:       Hx of left eye pain  Respiratory: Positive for cough. Negative for chest tightness, shortness of breath and wheezing.   Cardiovascular: Negative.  Negative for chest pain,  palpitations and leg swelling.  Gastrointestinal: Negative.   Musculoskeletal:       Right hip pain  Neurological: Negative.   Psychiatric/Behavioral: Negative for agitation, self-injury and suicidal ideas. The patient is not nervous/anxious.   All other systems reviewed and are negative.      Objective:   Physical Exam Vitals signs and nursing note reviewed.  Constitutional:      Appearance: Normal appearance. She is obese.  Eyes:     General: Lids are normal.        Right eye: No discharge.        Left eye: No discharge.     Extraocular Movements: Extraocular movements intact.     Conjunctiva/sclera: Conjunctivae normal.     Pupils: Pupils are equal, round, and reactive to light.     Funduscopic exam:    Right eye: No hemorrhage.        Left eye: No hemorrhage.  Cardiovascular:     Rate and Rhythm: Normal rate and regular rhythm.     Heart sounds: Normal heart sounds. No murmur.  Pulmonary:     Effort: Pulmonary effort is normal. No respiratory distress.     Breath sounds: Normal breath sounds. No wheezing or rhonchi.  Abdominal:     General: Bowel sounds are normal. There is no distension.     Palpations: Abdomen is soft. There is no mass.     Tenderness: There is no abdominal tenderness.  Musculoskeletal:        General: No swelling.     Right hip: She exhibits decreased range of motion and tenderness.  Neurological:     Mental Status: She is alert and oriented to person, place, and time.  Psychiatric:        Mood and Affect: Mood normal.        Behavior: Behavior normal.        Thought Content: Thought content normal.        Judgment: Judgment normal.   Body mass index is 31.25 kg/m.      Assessment:     DM2: HTN HLD: COPD CHF: Smoking: Obesity: Eye pain Hip pain    Plan:     Check problem list.

## 2018-06-13 NOTE — Patient Instructions (Signed)
Coping with Quitting Smoking  Quitting smoking is a physical and mental challenge. You will face cravings, withdrawal symptoms, and temptation. Before quitting, work with your health care provider to make a plan that can help you cope. Preparation can help you quit and keep you from giving in. How can I cope with cravings? Cravings usually last for 5-10 minutes. If you get through it, the craving will pass. Consider taking the following actions to help you cope with cravings:  Keep your mouth busy: ? Chew sugar-free gum. ? Suck on hard candies or a straw. ? Brush your teeth.  Keep your hands and body busy: ? Immediately change to a different activity when you feel a craving. ? Squeeze or play with a ball. ? Do an activity or a hobby, like making bead jewelry, practicing needlepoint, or working with wood. ? Mix up your normal routine. ? Take a short exercise break. Go for a quick walk or run up and down stairs. ? Spend time in public places where smoking is not allowed.  Focus on doing something kind or helpful for someone else.  Call a friend or family member to talk during a craving.  Join a support group.  Call a quit line, such as 1-800-QUIT-NOW.  Talk with your health care provider about medicines that might help you cope with cravings and make quitting easier for you. How can I deal with withdrawal symptoms? Your body may experience negative effects as it tries to get used to not having nicotine in the system. These effects are called withdrawal symptoms. They may include:  Feeling hungrier than normal.  Trouble concentrating.  Irritability.  Trouble sleeping.  Feeling depressed.  Restlessness and agitation.  Craving a cigarette. To manage withdrawal symptoms:  Avoid places, people, and activities that trigger your cravings.  Remember why you want to quit.  Get plenty of sleep.  Avoid coffee and other caffeinated drinks. These may worsen some of your symptoms.  How can I handle social situations? Social situations can be difficult when you are quitting smoking, especially in the first few weeks. To manage this, you can:  Avoid parties, bars, and other social situations where people might be smoking.  Avoid alcohol.  Leave right away if you have the urge to smoke.  Explain to your family and friends that you are quitting smoking. Ask for understanding and support.  Plan activities with friends or family where smoking is not an option. What are some ways I can cope with stress? Wanting to smoke may cause stress, and stress can make you want to smoke. Find ways to manage your stress. Relaxation techniques can help. For example:  Breathe slowly and deeply, in through your nose and out through your mouth.  Listen to soothing, relaxing music.  Talk with a family member or friend about your stress.  Light a candle.  Soak in a bath or take a shower.  Think about a peaceful place. What are some ways I can prevent weight gain? Be aware that many people gain weight after they quit smoking. However, not everyone does. To keep from gaining weight, have a plan in place before you quit and stick to the plan after you quit. Your plan should include:  Having healthy snacks. When you have a craving, it may help to: ? Eat plain popcorn, crunchy carrots, celery, or other cut vegetables. ? Chew sugar-free gum.  Changing how you eat: ? Eat small portion sizes at meals. ? Eat 4-6 small meals   throughout the day instead of 1-2 large meals a day. ? Be mindful when you eat. Do not watch television or do other things that might distract you as you eat.  Exercising regularly: ? Make time to exercise each day. If you do not have time for a long workout, do short bouts of exercise for 5-10 minutes several times a day. ? Do some form of strengthening exercise, like weight lifting, and some form of aerobic exercise, like running or swimming.  Drinking plenty of  water or other low-calorie or no-calorie drinks. Drink 6-8 glasses of water daily, or as much as instructed by your health care provider. Summary  Quitting smoking is a physical and mental challenge. You will face cravings, withdrawal symptoms, and temptation to smoke again. Preparation can help you as you go through these challenges.  You can cope with cravings by keeping your mouth busy (such as by chewing gum), keeping your body and hands busy, and making calls to family, friends, or a helpline for people who want to quit smoking.  You can cope with withdrawal symptoms by avoiding places where people smoke, avoiding drinks with caffeine, and getting plenty of rest.  Ask your health care provider about the different ways to prevent weight gain, avoid stress, and handle social situations. This information is not intended to replace advice given to you by your health care provider. Make sure you discuss any questions you have with your health care provider. Document Released: 12/19/2015 Document Revised: 12/19/2015 Document Reviewed: 12/19/2015 Elsevier Interactive Patient Education  2019 [employer]  

## 2018-06-13 NOTE — Assessment & Plan Note (Addendum)
Stable on current regimen. She was supposed to get sleep study to assess for OSA as a cause of worsening COPD symptoms last year but she didn't get a call for scheduling. I will contact Kenny[facility] Lose to help with referral. Smoking cessation discussed.

## 2018-06-13 NOTE — Assessment & Plan Note (Signed)
Smoking cessation instruction/counseling given:   Advised given. She tried Nicotine replacement in the past which did not help much. I gave her smoking cessation information as well as the 1800-Quit-Now number. She agreed to call. She will continue to work on quitting.

## 2018-06-13 NOTE — Assessment & Plan Note (Signed)
Working on diet change and exercise. Monitor closely.

## 2018-06-14 NOTE — Addendum Note (Signed)
Addended by: Andrena Mews T on: 06/14/2018 10:04 AM   Modules accepted: Orders

## 2018-06-15 ENCOUNTER — Other Ambulatory Visit: Payer: Self-pay

## 2018-06-15 ENCOUNTER — Ambulatory Visit (HOSPITAL_COMMUNITY)
Admission: RE | Admit: 2018-06-15 | Discharge: 2018-06-15 | Disposition: A | Discharge status: Home / Self Care | Payer: Medicare Other | Source: Ambulatory Visit | Attending: Family Medicine | Admitting: Family Medicine

## 2018-06-15 ENCOUNTER — Telehealth: Payer: Self-pay | Admitting: Family Medicine

## 2018-06-15 DIAGNOSIS — M1611 Unilateral primary osteoarthritis, right hip: Secondary | ICD-10-CM | POA: Diagnosis not present

## 2018-06-15 DIAGNOSIS — M25551 Pain in right hip: Secondary | ICD-10-CM | POA: Diagnosis present

## 2018-06-15 DIAGNOSIS — G8929 Other chronic pain: Secondary | ICD-10-CM | POA: Insufficient documentation

## 2018-06-15 NOTE — Telephone Encounter (Signed)
Result discussed with her. Manage conservatively for now. PT referral in the future. She said she will let me know if pain is worse and if she needed referral sooner.

## 2018-07-04 ENCOUNTER — Encounter: Payer: Self-pay | Admitting: Family Medicine

## 2018-07-04 ENCOUNTER — Ambulatory Visit (INDEPENDENT_AMBULATORY_CARE_PROVIDER_SITE_OTHER): Payer: Medicare Other | Admitting: Family Medicine

## 2018-07-04 ENCOUNTER — Other Ambulatory Visit: Payer: Self-pay

## 2018-07-04 VITALS — BP 152/100 | HR 82

## 2018-07-04 DIAGNOSIS — I10 Essential (primary) hypertension: Secondary | ICD-10-CM

## 2018-07-04 DIAGNOSIS — R21 Rash and other nonspecific skin eruption: Secondary | ICD-10-CM | POA: Diagnosis not present

## 2018-07-04 MED ORDER — TRIAMCINOLONE ACETONIDE 0.5 % EX OINT: 1.0000 "application " | TOPICAL_OINTMENT | Freq: Two times a day (BID) | CUTANEOUS | 0 refills | Status: DC

## 2018-07-04 MED ORDER — HYDROXYZINE HCL 10 MG PO TABS: 10.0000 mg | ORAL_TABLET | Freq: Three times a day (TID) | ORAL | 0 refills | Status: DC | PRN

## 2018-07-04 MED ORDER — TETANUS-DIPHTH-ACELL PERTUSSIS 5-2.5-18.5 LF-MCG/0.5 IM SUSP: 0.5000 mL | Freq: Once | INTRAMUSCULAR | 0 refills | Status: AC

## 2018-07-04 NOTE — Patient Instructions (Signed)

## 2018-07-04 NOTE — Progress Notes (Signed)
Subjective:     Patient ID: Betty Chapman, female   DOB: 18-Mar-1960, 58 y.o.   MRN: [id number]  Rash This is a new (Rash in the groin area) problem. The current episode started in the past 7 days (Started 3 days ago). The problem is unchanged. Location: Groin area. The rash is characterized by redness and itchiness (Big bumps). She was exposed to nothing (Not sexually active). Pertinent negatives include no fever, sore throat or vomiting. Treatments tried: Alcohol and hydrocortisone. The treatment provided mild relief. (COPD)  HTN: She took her Lisinopril 5 mg today. She is upset about the world situation including COVID-19 pandemic and black lives matter. She feels aggravated right now and thinks that is why her BP is up. She does not check her BP at home regularly, although she has a BP machine at home.  Current Outpatient Medications on File Prior to Visit  Medication Sig Dispense Refill  . albuterol (PROVENTIL HFA;VENTOLIN HFA) 108 (90 Base) MCG/ACT inhaler Inhale 2 puffs into the lungs every 6 (six) hours as needed for wheezing or shortness of breath. 3 Inhaler 1  . albuterol (PROVENTIL) (2.5 MG/3ML) 0.083% nebulizer solution Take 3 mLs (2.5 mg total) by nebulization every 6 (six) hours as needed for wheezing or shortness of breath. 75 mL 3  . aspirin 81 MG tablet Take 1 tablet (81 mg total) by mouth daily. 30 tablet 12  . DULERA 200-5 MCG/ACT AERO USE 2 PUFFS TWO TIMES DAILY 39 g 3  . ezetimibe (ZETIA) 10 MG tablet TAKE 1 TABLET BY MOUTH  DAILY 90 tablet 0  . famotidine (PEPCID) 10 MG tablet Take 10 mg by mouth 2 (two) times daily.    [name] lisinopril (PRINIVIL,ZESTRIL) 5 MG tablet TAKE 1 TABLET BY MOUTH  DAILY 90 tablet 1  . metFORMIN (GLUCOPHAGE) 500 MG tablet TAKE 1 TABLET BY MOUTH TWO  TIMES DAILY WITH A MEAL 180 tablet 1  . Omega-3 Fatty Acids (FISH OIL) 1000 MG CAPS Take 1 capsule by mouth 2 (two) times daily.    [name] tiotropium (SPIRIVA HANDIHALER) 18 MCG inhalation capsule Place 1  capsule (18 mcg total) into inhaler and inhale daily. 90 capsule 1  . ACCU-CHEK AVIVA PLUS test strip TEST UP TO 3 TIMES DAILY AS DIRECTED 300 each 0  . Accu-Chek Softclix Lancets lancets TEST UP TO 3 TIMES DAILY AS DIRECTED 300 each 0  . blood glucose meter kit and supplies KIT Accu-Chek Aviva Plus Use up to TID daily as directed. (FOR ICD-9 250.00, 250.01). 1 each 0  . ibuprofen (ADVIL,MOTRIN) 600 MG tablet Take 1 tablet (600 mg total) by mouth every 8 (eight) hours as needed. (Patient not taking: Reported on 07/04/2018) 30 tablet 0   No current facility-administered medications on file prior to visit.    Past Medical History:  Diagnosis Date  . Blurry vision, bilateral 03/20/2014  . COPD (chronic obstructive pulmonary disease) ([place])   . Dysphagia, pharyngoesophageal phase   . Hypercholesterolemia   . Hypertension   . TIA (transient ischemic attack)    Once  . Tobacco abuse    Vitals:   07/04/18 0829 07/04/18 0843  BP: (!) 158/90 (!) 152/100  Pulse: 82   SpO2: 96%       Review of Systems  Constitutional: Negative for fever.  HENT: Negative for sore throat.   Respiratory: Negative.   Cardiovascular: Negative.   Gastrointestinal: Negative.  Negative for vomiting.  Skin: Positive for rash.  All other systems reviewed and are  negative.      Objective:   Physical Exam Vitals signs reviewed. Exam conducted with a chaperone present (Jazmin Hartsell).  Cardiovascular:     Rate and Rhythm: Normal rate and regular rhythm.     Heart sounds: Normal heart sounds. No murmur.  Pulmonary:     Effort: Pulmonary effort is normal. No respiratory distress.     Breath sounds: Normal breath sounds. No stridor. No wheezing or rhonchi.  Abdominal:     General: Bowel sounds are normal. There is no distension.     Palpations: Abdomen is soft. There is no mass.     Tenderness: There is no abdominal tenderness.  Genitourinary:         Assessment:     Rash HTN    Plan:     Check  problem list for HTN  Rash: Looks typical of insect bite. ?? Bed bugs vs spider bite. No signs of infection. Topical steroid and antihistamine recommended. Patient to return soon if there is no improvement of if symptoms worsens. I recommended change of her bed sheets and cleaning of her room which she stated she already did.     Tdap recommended. She will get it at her pharmacy.

## 2018-07-04 NOTE — Assessment & Plan Note (Signed)
BP elevated today. ?? Stress and being upset. Her BP had been good in the past few months. I recommended home BP check for the next 2 weeks. Consider increasing her Lisinopril if it remains elevated. She agreed with the plan.

## 2018-07-18 ENCOUNTER — Ambulatory Visit (INDEPENDENT_AMBULATORY_CARE_PROVIDER_SITE_OTHER): Payer: Medicare Other | Admitting: Family Medicine

## 2018-07-18 ENCOUNTER — Other Ambulatory Visit: Payer: Self-pay

## 2018-07-18 ENCOUNTER — Encounter: Payer: Self-pay | Admitting: Family Medicine

## 2018-07-18 DIAGNOSIS — I10 Essential (primary) hypertension: Secondary | ICD-10-CM | POA: Diagnosis not present

## 2018-07-18 MED ORDER — LISINOPRIL 10 MG PO TABS: 10.0000 mg | ORAL_TABLET | Freq: Every day | ORAL | 0 refills | Status: DC

## 2018-07-18 NOTE — Patient Instructions (Signed)
Blood Pressure Record Sheet To take your blood pressure, you will need a blood pressure machine. You can buy a blood pressure machine (blood pressure monitor) at your clinic, drug store, or online. When choosing one, consider:  An automatic monitor that has an arm cuff.  A cuff that wraps snugly around your upper arm. You should be able to fit only one finger between your arm and the cuff.  A device that stores blood pressure reading results.  Do not choose a monitor that measures your blood pressure from your wrist or finger. Follow your health care provider's instructions for how to take your blood pressure. To use this form:  Get one reading in the morning (a.m.) before you take any medicines.  Get one reading in the evening (p.m.) before supper.  Take at least 2 readings with each blood pressure check. This makes sure the results are correct. Wait 1-2 minutes between measurements.  Write down the results in the spaces on this form.  Repeat this once a week, or as told by your health care provider.  Make a follow-up appointment with your health care provider to discuss the results. Blood pressure log Date: _______________________  a.m. _____________________(1st reading) _____________________(2nd reading)  p.m. _____________________(1st reading) _____________________(2nd reading) Date: _______________________  a.m. _____________________(1st reading) _____________________(2nd reading)  p.m. _____________________(1st reading) _____________________(2nd reading) Date: _______________________  a.m. _____________________(1st reading) _____________________(2nd reading)  p.m. _____________________(1st reading) _____________________(2nd reading) Date: _______________________  a.m. _____________________(1st reading) _____________________(2nd reading)  p.m. _____________________(1st reading) _____________________(2nd reading) Date: _______________________  a.m.  _____________________(1st reading) _____________________(2nd reading)  p.m. _____________________(1st reading) _____________________(2nd reading) This information is not intended to replace advice given to you by your health care provider. Make sure you discuss any questions you have with your health care provider. Document Released: 09/19/2002 Document Revised: 02/18/2017 Document Reviewed: 12/21/2016 Elsevier Patient Education  2020 [employer]  

## 2018-07-18 NOTE — Progress Notes (Signed)
Subjective:     Patient ID: Betty Chapman, female   DOB: Apr 29, 1960, 58 y.o.   MRN: [id number]  Hypertension This is a chronic problem. The problem has been waxing and waning since onset. Pertinent negatives include no anxiety, blurred vision, chest pain, headaches, palpitations, peripheral edema or shortness of breath. Risk factors for coronary artery disease include diabetes mellitus and smoking/tobacco exposure. Past treatments include ACE inhibitors. The current treatment provides moderate improvement. There are no compliance problems.   Rash: Resolved.  Current Outpatient Medications on File Prior to Visit  Medication Sig Dispense Refill  . albuterol (PROVENTIL HFA;VENTOLIN HFA) 108 (90 Base) MCG/ACT inhaler Inhale 2 puffs into the lungs every 6 (six) hours as needed for wheezing or shortness of breath. 3 Inhaler 1  . albuterol (PROVENTIL) (2.5 MG/3ML) 0.083% nebulizer solution Take 3 mLs (2.5 mg total) by nebulization every 6 (six) hours as needed for wheezing or shortness of breath. 75 mL 3  . aspirin 81 MG tablet Take 1 tablet (81 mg total) by mouth daily. 30 tablet 12  . DULERA 200-5 MCG/ACT AERO USE 2 PUFFS TWO TIMES DAILY 39 g 3  . ezetimibe (ZETIA) 10 MG tablet TAKE 1 TABLET BY MOUTH  DAILY 90 tablet 0  . famotidine (PEPCID) 10 MG tablet Take 10 mg by mouth 2 (two) times daily.    [name] lisinopril (PRINIVIL,ZESTRIL) 5 MG tablet TAKE 1 TABLET BY MOUTH  DAILY 90 tablet 1  . metFORMIN (GLUCOPHAGE) 500 MG tablet TAKE 1 TABLET BY MOUTH TWO  TIMES DAILY WITH A MEAL 180 tablet 1  . Omega-3 Fatty Acids (FISH OIL) 1000 MG CAPS Take 1 capsule by mouth 2 (two) times daily.    [name] tiotropium (SPIRIVA HANDIHALER) 18 MCG inhalation capsule Place 1 capsule (18 mcg total) into inhaler and inhale daily. 90 capsule 1  . Turmeric (QC TUMERIC COMPLEX) 500 MG CAPS Take by mouth.    [name] ACCU-CHEK AVIVA PLUS test strip TEST UP TO 3 TIMES DAILY AS DIRECTED 300 each 0  . Accu-Chek Softclix Lancets lancets  TEST UP TO 3 TIMES DAILY AS DIRECTED 300 each 0  . blood glucose meter kit and supplies KIT Accu-Chek Aviva Plus Use up to TID daily as directed. (FOR ICD-9 250.00, 250.01). 1 each 0  . hydrOXYzine (ATARAX/VISTARIL) 10 MG tablet Take 1 tablet (10 mg total) by mouth 3 (three) times daily as needed for itching. (Patient not taking: Reported on 07/18/2018) 15 tablet 0  . ibuprofen (ADVIL,MOTRIN) 600 MG tablet Take 1 tablet (600 mg total) by mouth every 8 (eight) hours as needed. (Patient not taking: Reported on 07/04/2018) 30 tablet 0  . triamcinolone ointment (KENALOG) 0.5 % Apply 1 application topically 2 (two) times daily. 30 g 0   No current facility-administered medications on file prior to visit.    Past Medical History:  Diagnosis Date  . Blurry vision, bilateral 03/20/2014  . COPD (chronic obstructive pulmonary disease) ([place])   . Dysphagia, pharyngoesophageal phase   . Hypercholesterolemia   . Hypertension   . TIA (transient ischemic attack)    Once  . Tobacco abuse    Vitals:   07/18/18 0830 07/18/18 0831 07/18/18 0842  BP: (!) 152/78 140/90 (!) 182/92  Pulse: 82    SpO2: 97%    Weight: 159 lb 2 oz (72.2 kg)    Height: 5' (1.524 m)       Review of Systems  Eyes: Negative for blurred vision.  Respiratory: Negative for shortness of breath.  Cardiovascular: Negative for chest pain and palpitations.  Skin: Negative for rash.  Neurological: Negative for headaches.       Objective:   Physical Exam Vitals signs and nursing note reviewed.  Constitutional:      Appearance: She is not ill-appearing.  Cardiovascular:     Rate and Rhythm: Normal rate and regular rhythm.     Pulses: Normal pulses.     Heart sounds: Normal heart sounds. No murmur.  Pulmonary:     Effort: Pulmonary effort is normal. No respiratory distress.     Breath sounds: Normal breath sounds. No wheezing.  Abdominal:     General: Bowel sounds are normal.     Palpations: Abdomen is soft. There is no mass.      Tenderness: There is no abdominal tenderness.  Musculoskeletal:     Right lower leg: No edema.     Left lower leg: No edema.  Neurological:     Mental Status: She is alert.    HOME BP record         Assessment:     Essential HTN Dermatitis: Resolved    Plan:     Check problem list.

## 2018-07-18 NOTE — Assessment & Plan Note (Signed)
BP still elevated. Discussed reduced salt intake and smoking cessation. Increased Lisinopril to 10 mg daily from 5 mg daily. Continue Home BP monitoring. F/U in 4 weeks for reassessment.

## 2018-08-11 ENCOUNTER — Encounter: Payer: Self-pay | Admitting: Family Medicine

## 2018-08-11 ENCOUNTER — Other Ambulatory Visit: Payer: Self-pay

## 2018-08-11 ENCOUNTER — Ambulatory Visit (INDEPENDENT_AMBULATORY_CARE_PROVIDER_SITE_OTHER): Payer: Medicare Other | Admitting: Family Medicine

## 2018-08-11 DIAGNOSIS — E1169 Type 2 diabetes mellitus with other specified complication: Secondary | ICD-10-CM | POA: Diagnosis not present

## 2018-08-11 DIAGNOSIS — I10 Essential (primary) hypertension: Secondary | ICD-10-CM | POA: Diagnosis not present

## 2018-08-11 NOTE — Progress Notes (Signed)
Subjective:     Patient ID: Betty Chapman, female   DOB: Sep 02, 1960, 58 y.o.   MRN: [id number]  HPI HTN/DM:She is compliant with Lisinopril 10 mg qd. Her BP at home runs as high as 180/90. Denies any neurologic symptoms. She is compliant with her Metformin for DM and her glucose runs normal at home. She denies any concern.  Current Outpatient Medications on File Prior to Visit  Medication Sig Dispense Refill  . aspirin 81 MG tablet Take 1 tablet (81 mg total) by mouth daily. 30 tablet 12  . DULERA 200-5 MCG/ACT AERO USE 2 PUFFS TWO TIMES DAILY 39 g 3  . ezetimibe (ZETIA) 10 MG tablet TAKE 1 TABLET BY MOUTH  DAILY 90 tablet 0  . famotidine (PEPCID) 10 MG tablet Take 10 mg by mouth 2 (two) times daily.    [name] lisinopril (ZESTRIL) 10 MG tablet Take 1 tablet (10 mg total) by mouth daily. (Patient taking differently: Take 20 mg by mouth daily. ) 90 tablet 0  . metFORMIN (GLUCOPHAGE) 500 MG tablet TAKE 1 TABLET BY MOUTH TWO  TIMES DAILY WITH A MEAL 180 tablet 1  . Omega-3 Fatty Acids (FISH OIL) 1000 MG CAPS Take 1 capsule by mouth 2 (two) times daily.    [name] tiotropium (SPIRIVA HANDIHALER) 18 MCG inhalation capsule Place 1 capsule (18 mcg total) into inhaler and inhale daily. 90 capsule 1  . ACCU-CHEK AVIVA PLUS test strip TEST UP TO 3 TIMES DAILY AS DIRECTED 300 each 0  . Accu-Chek Softclix Lancets lancets TEST UP TO 3 TIMES DAILY AS DIRECTED 300 each 0  . albuterol (PROVENTIL HFA;VENTOLIN HFA) 108 (90 Base) MCG/ACT inhaler Inhale 2 puffs into the lungs every 6 (six) hours as needed for wheezing or shortness of breath. (Patient not taking: Reported on 08/11/2018) 3 Inhaler 1  . albuterol (PROVENTIL) (2.5 MG/3ML) 0.083% nebulizer solution Take 3 mLs (2.5 mg total) by nebulization every 6 (six) hours as needed for wheezing or shortness of breath. (Patient not taking: Reported on 08/11/2018) 75 mL 3  . blood glucose meter kit and supplies KIT Accu-Chek Aviva Plus Use up to TID daily as directed. (FOR  ICD-9 250.00, 250.01). 1 each 0  . hydrOXYzine (ATARAX/VISTARIL) 10 MG tablet Take 1 tablet (10 mg total) by mouth 3 (three) times daily as needed for itching. (Patient not taking: Reported on 07/18/2018) 15 tablet 0  . ibuprofen (ADVIL,MOTRIN) 600 MG tablet Take 1 tablet (600 mg total) by mouth every 8 (eight) hours as needed. (Patient not taking: Reported on 07/04/2018) 30 tablet 0  . triamcinolone ointment (KENALOG) 0.5 % Apply 1 application topically 2 (two) times daily. 30 g 0  . Turmeric (QC TUMERIC COMPLEX) 500 MG CAPS Take by mouth.     No current facility-administered medications on file prior to visit.    Past Medical History:  Diagnosis Date  . Blurry vision, bilateral 03/20/2014  . COPD (chronic obstructive pulmonary disease) ([place])   . Dysphagia, pharyngoesophageal phase   . Hypercholesterolemia   . Hypertension   . TIA (transient ischemic attack)    Once  . Tobacco abuse    Vitals:   08/11/18 0825 08/11/18 0838 08/11/18 0840  BP: 140/78 (!) 162/89 (!) 164/90  Pulse: 78    SpO2: 98%    Weight: 159 lb (72.1 kg)    Height: 5' (1.524 m)      Review of Systems  Respiratory: Negative.   Cardiovascular: Negative.   Gastrointestinal: Negative.   Genitourinary: Negative.  Neurological: Negative.   All other systems reviewed and are negative.      Objective:   Physical Exam Vitals signs and nursing note reviewed.  Cardiovascular:     Rate and Rhythm: Normal rate and regular rhythm.     Pulses: Normal pulses.     Heart sounds: Normal heart sounds. No murmur.  Pulmonary:     Effort: Pulmonary effort is normal. No respiratory distress.     Breath sounds: Normal breath sounds. No wheezing.  Abdominal:     General: Abdomen is flat. Bowel sounds are normal. There is no distension.     Palpations: Abdomen is soft. There is no mass.     Tenderness: There is no abdominal tenderness.  Musculoskeletal: Normal range of motion.     Right lower leg: No edema.  Neurological:      Mental Status: She is alert.        Assessment:     HTN DM2    Plan:     Check problem list

## 2018-08-11 NOTE — Assessment & Plan Note (Signed)
Home BP running high. BP checked by CMA appears to be within range. However, when I recheck her BP in both arms, it was in the 528U systolic which almost correlates with her home readings. I recommended increasing her Lisinopril to 20 mg qd. Continue home BP monitoring. Keep BP between 100/60 and 140/90. She agreed with the plan and verbalized understanding. F/U appointment made for 3 weeks.

## 2018-08-11 NOTE — Assessment & Plan Note (Signed)
Stable on current regimen. I will reevaluate at her next appointment in 3 weeks.

## 2018-08-11 NOTE — Patient Instructions (Signed)
It was nice seeing you today. Your BP is still elevated. Let us up your Lisinopril to 20 mg daily.  I will like to see you back in 2 weeks.  Please keep checking your BP at home and your range should be between 100/60 - 140/90.  Call is numbers are off.

## 2018-08-21 ENCOUNTER — Other Ambulatory Visit: Payer: Self-pay | Admitting: Family Medicine

## 2018-09-01 ENCOUNTER — Ambulatory Visit (INDEPENDENT_AMBULATORY_CARE_PROVIDER_SITE_OTHER): Payer: Medicare Other | Admitting: Family Medicine

## 2018-09-01 ENCOUNTER — Other Ambulatory Visit: Payer: Self-pay

## 2018-09-01 ENCOUNTER — Encounter: Payer: Self-pay | Admitting: Family Medicine

## 2018-09-01 VITALS — BP 130/76 | HR 78 | Ht 60.0 in | Wt 155.0 lb

## 2018-09-01 DIAGNOSIS — E1169 Type 2 diabetes mellitus with other specified complication: Secondary | ICD-10-CM

## 2018-09-01 DIAGNOSIS — Z23 Encounter for immunization: Secondary | ICD-10-CM | POA: Diagnosis not present

## 2018-09-01 DIAGNOSIS — I10 Essential (primary) hypertension: Secondary | ICD-10-CM

## 2018-09-01 MED ORDER — BACLOFEN 10 MG PO TABS: 10.0000 mg | ORAL_TABLET | Freq: Two times a day (BID) | ORAL | 1 refills | Status: DC | PRN

## 2018-09-01 MED ORDER — LISINOPRIL 20 MG PO TABS: 20.0000 mg | ORAL_TABLET | Freq: Every day | ORAL | 1 refills | Status: DC

## 2018-09-01 NOTE — Assessment & Plan Note (Signed)
Compliant with meds. Recheck A1C in about 2-3 months.

## 2018-09-01 NOTE — Assessment & Plan Note (Signed)
BP improved a lot. Continue Lisinopril 20 mg qd.

## 2018-09-01 NOTE — Progress Notes (Signed)
Subjective:     Patient ID: Betty Chapman, female   DOB: [birth year], 58 y.o.   MRN: [id number]  HPI VZ:DGLOVFIEP with Metformin 500 mg BID. No Concern. HTN:She is now on Lisinopril 20 mg qd. Home BP has been good. She is here for f/u.  Current Outpatient Medications on File Prior to Visit  Medication Sig Dispense Refill  . aspirin 81 MG tablet Take 1 tablet (81 mg total) by mouth daily. 30 tablet 12  . DULERA 200-5 MCG/ACT AERO USE 2 PUFFS TWO TIMES DAILY 39 g 3  . ezetimibe (ZETIA) 10 MG tablet TAKE 1 TABLET BY MOUTH  DAILY 90 tablet 3  . famotidine (PEPCID) 10 MG tablet Take 10 mg by mouth 2 (two) times daily.    [name] lisinopril (ZESTRIL) 10 MG tablet Take 1 tablet (10 mg total) by mouth daily. (Patient taking differently: Take 20 mg by mouth daily. ) 90 tablet 0  . metFORMIN (GLUCOPHAGE) 500 MG tablet TAKE 1 TABLET BY MOUTH TWO  TIMES DAILY WITH A MEAL 180 tablet 3  . Omega-3 Fatty Acids (FISH OIL) 1000 MG CAPS Take 1 capsule by mouth 2 (two) times daily.    [name] tiotropium (SPIRIVA HANDIHALER) 18 MCG inhalation capsule Place 1 capsule (18 mcg total) into inhaler and inhale daily. 90 capsule 1  . triamcinolone ointment (KENALOG) 0.5 % Apply 1 application topically 2 (two) times daily. 30 g 0  . Turmeric (QC TUMERIC COMPLEX) 500 MG CAPS Take by mouth.    [name] ACCU-CHEK AVIVA PLUS test strip TEST UP TO 3 TIMES DAILY AS DIRECTED 300 each 0  . Accu-Chek Softclix Lancets lancets TEST UP TO 3 TIMES DAILY AS DIRECTED 300 each 0  . albuterol (PROVENTIL HFA;VENTOLIN HFA) 108 (90 Base) MCG/ACT inhaler Inhale 2 puffs into the lungs every 6 (six) hours as needed for wheezing or shortness of breath. (Patient not taking: Reported on 08/11/2018) 3 Inhaler 1  . albuterol (PROVENTIL) (2.5 MG/3ML) 0.083% nebulizer solution Take 3 mLs (2.5 mg total) by nebulization every 6 (six) hours as needed for wheezing or shortness of breath. (Patient not taking: Reported on 08/11/2018) 75 mL 3  . blood glucose meter kit and  supplies KIT Accu-Chek Aviva Plus Use up to TID daily as directed. (FOR ICD-9 250.00, 250.01). 1 each 0  . hydrOXYzine (ATARAX/VISTARIL) 10 MG tablet Take 1 tablet (10 mg total) by mouth 3 (three) times daily as needed for itching. (Patient not taking: Reported on 07/18/2018) 15 tablet 0  . ibuprofen (ADVIL,MOTRIN) 600 MG tablet Take 1 tablet (600 mg total) by mouth every 8 (eight) hours as needed. (Patient not taking: Reported on 07/04/2018) 30 tablet 0   No current facility-administered medications on file prior to visit.    Past Medical History:  Diagnosis Date  . Blurry vision, bilateral 03/20/2014  . COPD (chronic obstructive pulmonary disease) ([place])   . Dysphagia, pharyngoesophageal phase   . Hypercholesterolemia   . Hypertension   . TIA (transient ischemic attack)    Once  . Tobacco abuse    Vitals:   09/01/18 0840  BP: 130/76  Pulse: 78  SpO2: 97%  Weight: 155 lb (70.3 kg)  Height: 5' (1.524 m)      Review of Systems  Respiratory: Negative.   Cardiovascular: Negative.   Gastrointestinal: Negative.   Musculoskeletal: Negative.   Neurological: Negative.   All other systems reviewed and are negative.      Objective:   Physical Exam Vitals signs and nursing note  reviewed.  Neck:     Musculoskeletal: Neck supple.  Cardiovascular:     Rate and Rhythm: Normal rate and regular rhythm.     Pulses: Normal pulses.     Heart sounds: Normal heart sounds. No murmur.  Pulmonary:     Effort: Pulmonary effort is normal. No respiratory distress.     Breath sounds: Normal breath sounds. No stridor. No wheezing.  Abdominal:     General: Abdomen is flat. Bowel sounds are normal. There is no distension.     Palpations: Abdomen is soft. There is no mass.     Tenderness: There is no abdominal tenderness.  Musculoskeletal:     Right lower leg: No edema.     Left lower leg: No edema.  Neurological:     General: No focal deficit present.     Mental Status: She is alert and  oriented to person, place, and time.        Assessment:     DM2 HTN    Plan:     Check problem list

## 2018-09-01 NOTE — Patient Instructions (Signed)

## 2018-10-11 ENCOUNTER — Other Ambulatory Visit: Payer: Self-pay | Admitting: Family Medicine

## 2018-10-11 DIAGNOSIS — Z1231 Encounter for screening mammogram for malignant neoplasm of breast: Secondary | ICD-10-CM

## 2018-11-02 ENCOUNTER — Other Ambulatory Visit: Payer: Self-pay | Admitting: Family Medicine

## 2018-11-14 ENCOUNTER — Other Ambulatory Visit: Payer: Self-pay

## 2018-11-14 ENCOUNTER — Ambulatory Visit: Payer: Self-pay

## 2018-11-14 NOTE — Patient Instructions (Signed)
Visit Information  Goals Addressed            This Visit's Progress   . "I want to lose weight so I can stop taking my bp medication" (pt-stated)       Current Barriers:  [name] Knowledge Deficits related to self management of weight loss  Nurse Case Manager Clinical Goal(s):  [name] Over the next 30 days, patient will work with RN case manger to address needs related to weight loss  Interventions:  . Advised patient to to eat six small meals per day to increase her metabolism.  Patient Self Care Activities:  . Self administers medications as prescribed . Attends all scheduled provider appointments . Calls pharmacy for medication refills . Performs ADL's independently . Performs IADL's independently . Calls provider office for new concerns or questions . Unable to independently self manage weight loss  Initial goal documentation     . "Since I started taking my bp medication I no longer check my blood pressures" (pt-stated)       Current Barriers:  [name] Knowledge Deficits related to self management of of HTN as evidenced by not checking BP at regular intervals.  Nurse Case Manager Clinical Goal(s):  [name] Over the next 14 days, patient will demonstrate improved health management independence as evidenced bythe patient and recording her blood pressures.  Interventions:  . Provided education to patient re: checking blood pressures and recoding values to notice any patterns.  Patient Self Care Activities:  . Attends all scheduled provider appointments . Calls provider office for new concerns or questions . Does not check her Blood pressures regularly and  record values  Initial goal documentation        Betty Chapman was given information about Care Management services today including:  1. Care Management services include personalized support from designated clinical staff supervised by her physician, including individualized plan of care and coordination with other care providers 2.  24/7 contact phone numbers for assistance for urgent and routine care needs. 3. The patient may stop CCM services at any time (effective at the end of the month) by phone call to the office staff.  Patient agreed to services and verbal consent obtained.   The patient verbalized understanding of instructions provided today and declined a print copy of patient instruction materials.   The patient has been provided with contact information for the care management team and has been advised to call with any health related questions or concerns.   Lazaro Arms RN, BSN, [facility] Care Management Coordinator [place] Phone: [phone]  Office: [phone] Fax: [phone]    Managing Your Hypertension Hypertension is commonly called high blood pressure. This is when the force of your blood pressing against the walls of your arteries is too strong. Arteries are blood vessels that carry blood from your heart throughout your body. Hypertension forces the heart to work harder to pump blood, and may cause the arteries to become narrow or stiff. Having untreated or uncontrolled hypertension can cause heart attack, stroke, kidney disease, and other problems. What are blood pressure readings? A blood pressure reading consists of a higher number over a lower number. Ideally, your blood pressure should be below 120/80. The first ("top") number is called the systolic pressure. It is a measure of the pressure in your arteries as your heart beats. The second ("bottom") number is called the diastolic pressure. It is a measure of the pressure in your arteries as the heart relaxes. What does  my blood pressure reading mean? Blood pressure is classified into four stages. Based on your blood pressure reading, your health care provider may use the following stages to determine what type of treatment you need, if any. Systolic pressure and diastolic pressure are measured in a unit called mm Hg.  Normal  Systolic pressure: below 123456.  Diastolic pressure: below 80. Elevated  Systolic pressure: [id number].  Diastolic pressure: below 80. Hypertension stage 1  Systolic pressure: 0000000.  Diastolic pressure: [id number]. Hypertension stage 2  Systolic pressure: XX123456 or above.  Diastolic pressure: 90 or above. What health risks are associated with hypertension? Managing your hypertension is an important responsibility. Uncontrolled hypertension can lead to:  A heart attack.  A stroke.  A weakened blood vessel (aneurysm).  Heart failure.  Kidney damage.  Eye damage.  Metabolic syndrome.  Memory and concentration problems. What changes can I make to manage my hypertension? Hypertension can be managed by making lifestyle changes and possibly by taking medicines. Your health care provider will help you make a plan to bring your blood pressure within a normal range. Eating and drinking   Eat a diet that is high in fiber and potassium, and low in salt (sodium), added sugar, and fat. An example eating plan is called the DASH (Dietary Approaches to Stop Hypertension) diet. To eat this way: ? Eat plenty of fresh fruits and vegetables. Try to fill half of your plate at each meal with fruits and vegetables. ? Eat whole grains, such as whole wheat pasta, brown rice, or whole grain bread. Fill about one quarter of your plate with whole grains. ? Eat low-fat diary products. ? Avoid fatty cuts of meat, processed or cured meats, and poultry with skin. Fill about one quarter of your plate with lean proteins such as fish, chicken without skin, beans, eggs, and tofu. ? Avoid premade and processed foods. These tend to be higher in sodium, added sugar, and fat.  Reduce your daily sodium intake. Most people with hypertension should eat less than 1,500 mg of sodium a day.  Limit alcohol intake to no more than 1 drink a day for nonpregnant women and 2 drinks a day for men. One drink equals 12  oz of beer, 5 oz of wine, or 1 oz of hard liquor. Lifestyle  Work with your health care provider to maintain a healthy body weight, or to lose weight. Ask what an ideal weight is for you.  Get at least 30 minutes of exercise that causes your heart to beat faster (aerobic exercise) most days of the week. Activities may include walking, swimming, or biking.  Include exercise to strengthen your muscles (resistance exercise), such as weight lifting, as part of your weekly exercise routine. Try to do these types of exercises for 30 minutes at least 3 days a week.  Do not use any products that contain nicotine or tobacco, such as cigarettes and e-cigarettes. If you need help quitting, ask your health care provider.  Control any long-term (chronic) conditions you have, such as high cholesterol or diabetes. Monitoring  Monitor your blood pressure at home as told by your health care provider. Your personal target blood pressure may vary depending on your medical conditions, your age, and other factors.  Have your blood pressure checked regularly, as often as told by your health care provider. Working with your health care provider  Review all the medicines you take with your health care provider because there may be side effects or interactions.  Talk  with your health care provider about your diet, exercise habits, and other lifestyle factors that may be contributing to hypertension.  Visit your health care provider regularly. Your health care provider can help you create and adjust your plan for managing hypertension. Will I need medicine to control my blood pressure? Your health care provider may prescribe medicine if lifestyle changes are not enough to get your blood pressure under control, and if:  Your systolic blood pressure is 130 or higher.  Your diastolic blood pressure is 80 or higher. Take medicines only as told by your health care provider. Follow the directions carefully. Blood  pressure medicines must be taken as prescribed. The medicine does not work as well when you skip doses. Skipping doses also puts you at risk for problems. Contact a health care provider if:  You think you are having a reaction to medicines you have taken.  You have repeated (recurrent) headaches.  You feel dizzy.  You have swelling in your ankles.  You have trouble with your vision. Get help right away if:  You develop a severe headache or confusion.  You have unusual weakness or numbness, or you feel faint.  You have severe pain in your chest or abdomen.  You vomit repeatedly.  You have trouble breathing. Summary  Hypertension is when the force of blood pumping through your arteries is too strong. If this condition is not controlled, it may put you at risk for serious complications.  Your personal target blood pressure may vary depending on your medical conditions, your age, and other factors. For most people, a normal blood pressure is less than 120/80.  Hypertension is managed by lifestyle changes, medicines, or both. Lifestyle changes include weight loss, eating a healthy, low-sodium diet, exercising more, and limiting alcohol. This information is not intended to replace advice given to you by your health care provider. Make sure you discuss any questions you have with your health care provider. Document Released: 09/15/2011 Document Revised: 04/14/2018 Document Reviewed: 11/19/2015 Elsevier Patient Education  2020 [employer].

## 2018-11-14 NOTE — Chronic Care Management (AMB) (Signed)
Care Management   Initial Visit Note  11/14/2018 Name: Betty Chapman MRN: [id number] DOB: 25-Jun-1960   Assessment: Betty Chapman is a 58 y.o. year old female who sees [name], MD for primary care. The care management team was consulted for assistance with care management and care coordination needs related to Disease Management Educational Needs.   Review of patient status, including review of consultants reports, relevant laboratory and other test results, and collaboration with appropriate care team members and the patient's provider was performed as part of comprehensive patient evaluation and provision of care management services.    SDOH (Social Determinants of Health) screening performed today: Tobacco Use. See Care Plan for related entries.   Advanced Directives: See Care Plan and Vynca application for related entries.   Outpatient Encounter Medications as of 11/14/2018  Medication Sig Note  . ACCU-CHEK AVIVA PLUS test strip TEST UP TO 3 TIMES DAILY AS DIRECTED   . Accu-Chek Softclix Lancets lancets TEST UP TO 3 TIMES DAILY AS DIRECTED   . albuterol (PROVENTIL HFA;VENTOLIN HFA) 108 (90 Base) MCG/ACT inhaler Inhale 2 puffs into the lungs every 6 (six) hours as needed for wheezing or shortness of breath. 11/14/2018: Only uses when she has to   . albuterol (PROVENTIL) (2.5 MG/3ML) 0.083% nebulizer solution Take 3 mLs (2.5 mg total) by nebulization every 6 (six) hours as needed for wheezing or shortness of breath. 11/14/2018: Only uses when she needs to  . aspirin 81 MG tablet Take 1 tablet (81 mg total) by mouth daily. 11/14/2018: Patient states that she will take 1 to 2 times a week.  She feels that aspirin will "mess with her stomach.  . baclofen (LIORESAL) 10 MG tablet Take 1 tablet (10 mg total) by mouth 2 (two) times daily as needed for muscle spasms. 11/14/2018: Only take when needed   . blood glucose meter kit and supplies KIT Accu-Chek Aviva Plus Use up to  TID daily as directed. (FOR ICD-9 250.00, 250.01).   . DULERA 200-5 MCG/ACT AERO USE 2 PUFFS TWO TIMES DAILY   . ezetimibe (ZETIA) 10 MG tablet TAKE 1 TABLET BY MOUTH  DAILY   . famotidine (PEPCID) 10 MG tablet Take 10 mg by mouth 2 (two) times daily. 11/14/2018: Takes over the counter med  . ibuprofen (ADVIL,MOTRIN) 600 MG tablet Take 1 tablet (600 mg total) by mouth every 8 (eight) hours as needed. 11/14/2018: As needed   . lisinopril (ZESTRIL) 20 MG tablet Take 1 tablet (20 mg total) by mouth daily.   . metFORMIN (GLUCOPHAGE) 500 MG tablet TAKE 1 TABLET BY MOUTH TWO  TIMES DAILY WITH A MEAL   . Omega-3 Fatty Acids (FISH OIL) 1000 MG CAPS Take 1 capsule by mouth 2 (two) times daily.   [name] tiotropium (SPIRIVA HANDIHALER) 18 MCG inhalation capsule Place 1 capsule (18 mcg total) into inhaler and inhale daily.   [name] triamcinolone ointment (KENALOG) 0.5 % Apply 1 application topically 2 (two) times daily. 11/14/2018: As needed   . Turmeric (QC TUMERIC COMPLEX) 500 MG CAPS Take by mouth.   . hydrOXYzine (ATARAX/VISTARIL) 10 MG tablet Take 1 tablet (10 mg total) by mouth 3 (three) times daily as needed for itching.    No facility-administered encounter medications on file as of 11/14/2018.       Objective:  BP Readings from Last 3 Encounters:  09/01/18 130/76  08/11/18 (!) 164/90  07/18/18 (!) 182/92   Wt Readings from Last 3 Encounters:  09/01/18 155 lb (  70.3 kg)  08/11/18 159 lb (72.1 kg)  07/18/18 159 lb 2 oz (72.2 kg)     Goals Addressed            This Visit's Progress   . "[name] want to stop smoking right now" (pt-stated)       Current Barriers:  . Chronic Disease Management support and education needs related to smoking cessation and chronic diease  Patient states that she  is not ready to stop smoking  Nurse Case Manager Clinical Goal(s):  [name] Over the next 60 days, patient will verbalize basic understanding of relationship of smoking and chronic diease  Interventions:  .  Advised patient to consider smoking cessation . Provided education to patient re: cessation stratagies . Discussed plans with patient for ongoing care management follow up and provided patient with direct contact information for care management team  Patient Self Care Activities:  . Attends all scheduled provider appointments . Performs ADL's independently . Calls provider office for new concerns or questions . Unable to independently stop smoking  Initial goal documentation     . "I want to lose weight so I can stop taking my bp medication" (pt-stated)       Current Barriers:  [name] Knowledge Deficits related to self management of weight loss  Nurse Case Manager Clinical Goal(s):  [name] Over the next 30 days, patient will work with RN case manger to address needs related to weight loss  Interventions:  . Advised patient to to eat six small meals per day to increase her metabolism.  Patient Self Care Activities:  . Self administers medications as prescribed . Attends all scheduled provider appointments . Calls pharmacy for medication refills . Performs ADL's independently . Performs IADL's independently . Calls provider office for new concerns or questions . Unable to independently self manage weight loss  Initial goal documentation     . "Since I started taking my bp medication I no longer check my blood pressures" (pt-stated)       Current Barriers:  [name] Knowledge Deficits related to self management of of HTN as evidenced by not checking BP at regular intervals.  Nurse Case Manager Clinical Goal(s):  [name] Over the next 14 days, patient will demonstrate improved health management independence as evidenced by the patient  checking and recording her blood pressures.  Interventions:  . Provided education to patient re: checking blood pressures and recoding values to notice any abnormal patterns.  Patient Self Care Activities:  . Attends all scheduled provider appointments . Calls  provider office for new concerns or questions . Does not check her Blood pressures regularly and  record values  Initial goal documentation     . I want to stop sticking my fingers three times a day (pt-stated)       GOAL: Understand your diabetes (DM) diagnosis and how to manage it  Diabetes (DM) Self-Health Management Activities:   Understand  your diabetes (DM) diagnosis and symptoms  Diabetes (DM) Definition: A condition in which there is a high level of sugar (glucose) in the blood. This may be caused by several factors.   Some Diabetes signs/symptoms:  . Increased urination . Excessive thirst and hunger . Blurred vision and weakness . Tingling in hands or feet . Sores with possible infections that heal slowly or do not heal   Things you can do: [name] Control your blood sugar, cholesterol and blood pressure through healthy eating habits and an active lifestyle . Maintain an ideal body  weight    2. Know which medicines are used to manage your diabetes (DM) and take them every day   Name of Medication When I take this medication  Metformin 500 mg  Two times a day              4. Record and keep your provider appointments                 Follow up plan:  Telephone follow up appointment with care management team member scheduled for: N11/24/20 @ 11 am and patient has agreed to the phone call. The patient has been provided with contact information for the care management team and has been advised to call with any health related questions or concerns.   Ms. Matto was given information about Care Management services today including:  1. Care Management services include personalized support from designated clinical staff supervised by a physician, including individualized plan of care and coordination with other care providers 2. 24/7 contact phone numbers for assistance for urgent and routine care needs. 3. The patient may stop Care Management services at any  time (effective at the end of the month) by phone call to the office staff.  Patient agreed to services and verbal consent obtained.   Lazaro Arms RN, BSN, [facility] Care Management Coordinator [place] Phone: [phone]  Office: [phone] Fax: [phone]

## 2018-11-27 ENCOUNTER — Ambulatory Visit
Admission: RE | Admit: 2018-11-27 | Discharge: 2018-11-27 | Disposition: A | Discharge status: Home / Self Care | Payer: Medicare Other | Source: Ambulatory Visit | Attending: Family Medicine | Admitting: Family Medicine

## 2018-11-27 ENCOUNTER — Other Ambulatory Visit: Payer: Self-pay

## 2018-11-27 DIAGNOSIS — Z1231 Encounter for screening mammogram for malignant neoplasm of breast: Secondary | ICD-10-CM

## 2018-11-28 ENCOUNTER — Ambulatory Visit: Payer: Medicare Other

## 2018-11-28 NOTE — Chronic Care Management (AMB) (Signed)
Care Management   Follow Up Note   11/28/2018 Name: Betty Chapman MRN: [id number] DOB: 25-Apr-1960  Referred by: [name], MD Reason for referral : Care Coordination (Care Managment F/U)   Betty Chapman is a 58 y.o. year old female who is a primary care patient of [name], MD. The care management team was consulted for assistance with care management and care coordination needs.    Review of patient status, including review of consultants reports, relevant laboratory and other test results, and collaboration with appropriate care team members and the patient's provider was performed as part of comprehensive patient evaluation and provision of chronic care management services.    SDOH (Social Determinants of Health) screening performed today: None. See Care Plan for related entries.   Advanced Directives: See Care Plan and Vynca application for related entries.   Goals Addressed            This Visit's Progress   . "I want to lose weight so I can stop taking my bp medication" (pt-stated)       Current Barriers:  [name] Knowledge Deficits related to self management of weight loss  Nurse Case Manager Clinical Goal(s):  [name] Over the next 30 days, patient will work with RN case manger to address needs related to weight loss  Interventions:  . Advised patient to to eat six small meals per day to increase her metabolism. . Patient will start to weigh once a month . She is drinking more water . She is trying to monitor her food intake  . She is walking  Patient Self Care Activities:  . Self administers medications as prescribed . Attends all scheduled provider appointments . Calls pharmacy for medication refills . Performs ADL's independently . Performs IADL's independently . Calls provider office for new concerns or questions . Unable to independently self manage weight loss  Please see past updates related to this goal by clicking on the "Past  Updates" button in the selected goal      . "Since I started taking my bp medication I no longer check my blood pressures" (pt-stated)       Current Barriers:  [name] Knowledge Deficits related to self management of of HTN as evidenced by not checking BP at regular intervals.  Nurse Case Manager Clinical Goal(s):  [name] Over the next 14 days, patient will demonstrate improved health management independence as evidenced by the patient  checking and recording her blood pressures.  . Interventions:  . Provided education to patient re: sodium in diet . Her pressures range between 130/70 to 130/77 . She is taking her medication as prescribed . The patient has been checking her blood pressure 1 to 2 times a week and recording valuess to recognizes any abnormal patterns . Drink water . Continue with her walking   Patient Self Care Activities:  . Attends all scheduled provider appointments . Calls provider office for new concerns or questions . Does not check her Blood pressures regularly and  record values  Please see past updates related to this goal by clicking on the "Past Updates" button in the selected goal      . I want to stop sticking my fingers three times a day (pt-stated)       GOAL: Understand your diabetes (DM) diagnosis and how to manage it  Diabetes (DM) Self-Health Management Activities:   Understand  your diabetes (DM) diagnosis and symptoms  Diabetes (DM) Definition: A condition in which there  is a high level of sugar (glucose) in the blood. This may be caused by several factors.   Some Diabetes signs/symptoms:  . Increased urination . Excessive thirst and hunger . Blurred vision and weakness . Tingling in hands or feet . Sores with possible infections that heal slowly or do not heal   Things you can do: [name] Control your blood sugar, cholesterol and blood pressure through healthy eating habits and an active lifestyle . Maintain an ideal body weight    2. Know which  medicines are used to manage your diabetes (DM) and take them every day   Name of Medication When I take this medication  Metformin 500 mg  Two times a day              4. Record and keep your provider appointments   We discussed the signs and symptoms of hypoglycemia and I am glad you understand and recognize the feeling when your sugar gets low. (only had 2 episodes since she has been a diabetic) not recent Patient understand to take 6 oz of juice or 6 pieces of hard candy or glucose tablet and 15 minutes later re check your blood sugar.  Reinforced instructions regarding daily blood glucose monitoring. Reviewed medications, nutrition and treatment recommendations. Confirmed pending provider visits 12/18/18 with Dr. [name]   Please see past updates related to this goal by clicking on the "Past Updates" button in the selected goal             The care management team will reach out to the patient again over the next 14 days.  The patient has been provided with contact information for the care management team and has been advised to call with any health related questions or concerns.   Lazaro Arms RN, BSN, [facility] Care Management Coordinator [place] Phone: [phone] [place] Office: [phone] Fax: [phone]

## 2018-11-28 NOTE — Patient Instructions (Signed)
Visit Information  Goals Addressed            This Visit's Progress   . "I want to lose weight so I can stop taking my bp medication" (pt-stated)       Current Barriers:  [name] Knowledge Deficits related to self management of weight loss  Nurse Case Manager Clinical Goal(s):  [name] Over the next 30 days, patient will work with RN case manger to address needs related to weight loss  Interventions:  . Advised patient to to eat six small meals per day to increase her metabolism. . Patient will start to weigh once a month . She is drinking more water . She is trying to monitor her food intake  . She is walking  Patient Self Care Activities:  . Self administers medications as prescribed . Attends all scheduled provider appointments . Calls pharmacy for medication refills . Performs ADL's independently . Performs IADL's independently . Calls provider office for new concerns or questions . Unable to independently self manage weight loss  Please see past updates related to this goal by clicking on the "Past Updates" button in the selected goal      . "Since I started taking my bp medication I no longer check my blood pressures" (pt-stated)       Current Barriers:  [name] Knowledge Deficits related to self management of of HTN as evidenced by not checking BP at regular intervals.  Nurse Case Manager Clinical Goal(s):  [name] Over the next 14 days, patient will demonstrate improved health management independence as evidenced by the patient  checking and recording her blood pressures.  . Interventions:  . Provided education to patient re: sodium in diet . Her pressures range between 130/70 to 130/77 . She is taking her medication as prescribed . The patient has been checking her blood pressure 1 to 2 times a week and recording valuess to recognizes any abnormal patterns . Drink water . Continue with her walking   Patient Self Care Activities:  . Attends all scheduled provider  appointments . Calls provider office for new concerns or questions . Does not check her Blood pressures regularly and  record values  Please see past updates related to this goal by clicking on the "Past Updates" button in the selected goal      . I want to stop sticking my fingers three times a day (pt-stated)       GOAL: Understand your diabetes (DM) diagnosis and how to manage it  Diabetes (DM) Self-Health Management Activities:   Understand  your diabetes (DM) diagnosis and symptoms  Diabetes (DM) Definition: A condition in which there is a high level of sugar (glucose) in the blood. This may be caused by several factors.   Some Diabetes signs/symptoms:  . Increased urination . Excessive thirst and hunger . Blurred vision and weakness . Tingling in hands or feet . Sores with possible infections that heal slowly or do not heal   Things you can do: [name] Control your blood sugar, cholesterol and blood pressure through healthy eating habits and an active lifestyle . Maintain an ideal body weight    2. Know which medicines are used to manage your diabetes (DM) and take them every day   Name of Medication When I take this medication  Metformin 500 mg  Two times a day              4. Record and keep your provider appointments   We discussed the  signs and symptoms of hypoglycemia and I am glad you understand and recognize the feeling when your sugar gets low. (only had 2 episodes since she has been a diabetic) not recent Patient understand to take 6 oz of juice or 6 pieces of hard candy or glucose tablet and 15 minutes later re check your blood sugar.  Reinforced instructions regarding daily blood glucose monitoring. Reviewed medications, nutrition and treatment recommendations. Confirmed pending provider visits 12/18/18 with Dr. [name]   Please see past updates related to this goal by clicking on the "Past Updates" button in the selected goal            Betty Chapman  was given information about Care Management services today including:  1. Care Management services include personalized support from designated clinical staff supervised by her physician, including individualized plan of care and coordination with other care providers 2. 24/7 contact phone numbers for assistance for urgent and routine care needs. 3. The patient may stop CCM services at any time (effective at the end of the month) by phone call to the office staff.  Patient agreed to services and verbal consent obtained.   The patient verbalized understanding of instructions provided today and declined a print copy of patient instruction materials.   The care management team will reach out to the patient again over the next 14 days.  The patient has been provided with contact information for the care management team and has been advised to call with any health related questions or concerns.   Lazaro Arms RN, BSN, [facility] Care Management Coordinator [place] Phone: [phone] I Office: [phone] Fax: [phone]

## 2018-12-18 ENCOUNTER — Ambulatory Visit (INDEPENDENT_AMBULATORY_CARE_PROVIDER_SITE_OTHER): Payer: Medicare Other | Admitting: Family Medicine

## 2018-12-18 ENCOUNTER — Encounter: Payer: Self-pay | Admitting: Family Medicine

## 2018-12-18 ENCOUNTER — Other Ambulatory Visit: Payer: Self-pay

## 2018-12-18 VITALS — BP 158/84 | HR 89 | Ht 60.0 in | Wt 151.0 lb

## 2018-12-18 DIAGNOSIS — I10 Essential (primary) hypertension: Secondary | ICD-10-CM

## 2018-12-18 DIAGNOSIS — E663 Overweight: Secondary | ICD-10-CM | POA: Insufficient documentation

## 2018-12-18 DIAGNOSIS — E1169 Type 2 diabetes mellitus with other specified complication: Secondary | ICD-10-CM

## 2018-12-18 DIAGNOSIS — E785 Hyperlipidemia, unspecified: Secondary | ICD-10-CM

## 2018-12-18 DIAGNOSIS — J441 Chronic obstructive pulmonary disease with (acute) exacerbation: Secondary | ICD-10-CM

## 2018-12-18 DIAGNOSIS — I5030 Unspecified diastolic (congestive) heart failure: Secondary | ICD-10-CM

## 2018-12-18 DIAGNOSIS — E119 Type 2 diabetes mellitus without complications: Secondary | ICD-10-CM | POA: Diagnosis not present

## 2018-12-18 DIAGNOSIS — Z720 Tobacco use: Secondary | ICD-10-CM

## 2018-12-18 DIAGNOSIS — E782 Mixed hyperlipidemia: Secondary | ICD-10-CM

## 2018-12-18 LAB — POCT GLYCOSYLATED HEMOGLOBIN (HGB A1C): HbA1c, POC (controlled diabetic range): 5.8 % (ref 0.0–7.0)

## 2018-12-18 NOTE — Assessment & Plan Note (Addendum)
Counseling done. Repeat screening CT lungs for cancer ordered. Appointment scheduled.

## 2018-12-18 NOTE — Assessment & Plan Note (Signed)
No acute flare. Unable to cut down on tobacco smoking. We will continue counseling. Continue current regimen

## 2018-12-18 NOTE — Assessment & Plan Note (Signed)
BP elevated this morning. Home monitoring recommended with unchanged regimen. F/U in 1-2 weeks for reassessment.

## 2018-12-18 NOTE — Patient Instructions (Signed)
Blood Pressure Record Sheet To take your blood pressure, you will need a blood pressure machine. You can buy a blood pressure machine (blood pressure monitor) at your clinic, drug store, or online. When choosing one, consider:  An automatic monitor that has an arm cuff.  A cuff that wraps snugly around your upper arm. You should be able to fit only one finger between your arm and the cuff.  A device that stores blood pressure reading results.  Do not choose a monitor that measures your blood pressure from your wrist or finger. Follow your health care provider's instructions for how to take your blood pressure. To use this form:  Get one reading in the morning (a.m.) before you take any medicines.  Get one reading in the evening (p.m.) before supper.  Take at least 2 readings with each blood pressure check. This makes sure the results are correct. Wait 1-2 minutes between measurements.  Write down the results in the spaces on this form.  Repeat this once a week, or as told by your health care provider.  Make a follow-up appointment with your health care provider to discuss the results. Blood pressure log Date: _______________________  a.m. _____________________(1st reading) _____________________(2nd reading)  p.m. _____________________(1st reading) _____________________(2nd reading) Date: _______________________  a.m. _____________________(1st reading) _____________________(2nd reading)  p.m. _____________________(1st reading) _____________________(2nd reading) Date: _______________________  a.m. _____________________(1st reading) _____________________(2nd reading)  p.m. _____________________(1st reading) _____________________(2nd reading) Date: _______________________  a.m. _____________________(1st reading) _____________________(2nd reading)  p.m. _____________________(1st reading) _____________________(2nd reading) Date: _______________________  a.m.  _____________________(1st reading) _____________________(2nd reading)  p.m. _____________________(1st reading) _____________________(2nd reading) This information is not intended to replace advice given to you by your health care provider. Make sure you discuss any questions you have with your health care provider. Document Released: 09/19/2002 Document Revised: 02/18/2017 Document Reviewed: 12/21/2016 Elsevier Patient Education  2020 [employer]  

## 2018-12-18 NOTE — Assessment & Plan Note (Signed)
FLP checked today. Continue Zetia. Statin discussed again but she declined due to previous reactions.

## 2018-12-18 NOTE — Progress Notes (Signed)
     Subjective:     Patient ID: Betty Chapman, female   DOB: July 06, 1960, 58 y.o.   MRN: [id number]  HPI DM2:Here for f/u. She is compliant with Metformin 500 mg BID. She also made dietary changes. She has been getting enough exercise at work only. COPD/Smoking:Cough has not changed from her last visit. She coughs more at night. She also works in a dusty environment at [employer] 5 hrs weekly. She increased her smoking toe > 1 PPD not due to stress but because she does not go out much and there is not much else to do. She wears her masks regularly at work when around people. HLD/HTN:Compliant with meds. Here for f/u.  Review of Systems  Respiratory: Negative.   Cardiovascular: Negative.   Gastrointestinal: Negative.   Neurological: Negative.   All other systems reviewed and are negative.  Vitals:   12/18/18 0823 12/18/18 0842 12/18/18 0843  BP: (!) 148/78 (!) 150/85 (!) 158/84  Pulse: 89    SpO2: 98%    Weight: 151 lb (68.5 kg)    Height: 5' (1.524 m)         Objective:   Physical Exam Vitals and nursing note reviewed.  Constitutional:      Appearance: She is not ill-appearing.  Cardiovascular:     Rate and Rhythm: Normal rate and regular rhythm.     Pulses: Normal pulses.     Heart sounds: Normal heart sounds. No murmur.  Pulmonary:     Effort: Pulmonary effort is normal. No respiratory distress.     Breath sounds: Normal breath sounds. No stridor. No wheezing or rhonchi.  Abdominal:     General: Abdomen is flat. Bowel sounds are normal. There is no distension.     Palpations: Abdomen is soft.     Tenderness: There is no abdominal tenderness.  Musculoskeletal:     Right lower leg: No edema.     Left lower leg: No edema.  Neurological:     Mental Status: She is alert and oriented to person, place, and time.  Ext: Sensory exam of the foot is normal, tested with the monofilament. Good pulses, no lesions or ulcers, good peripheral pulses. + Calluses of her big  toes. Otherwise, normal exam.       Assessment:     DM2: Controlled HTN HLD COPD/Smoker    Plan:     Check problem list.

## 2018-12-18 NOTE — Assessment & Plan Note (Signed)
She has done well on intentional weight loss. Monitor closely.

## 2018-12-18 NOTE — Assessment & Plan Note (Signed)
Stable

## 2018-12-18 NOTE — Assessment & Plan Note (Signed)
Well controlled. Considered cutting back on metformin now that she is in the pre-DM zone. Monitor closely.

## 2018-12-19 ENCOUNTER — Encounter: Payer: Self-pay | Admitting: Family Medicine

## 2018-12-19 LAB — LIPID PANEL
Chol/HDL Ratio: 2.7 ratio (ref 0.0–4.4)
Cholesterol, Total: 168 mg/dL (ref 100–199)
HDL: 62 mg/dL (ref >39)
LDL Chol Calc (NIH): 88 mg/dL (ref 0–99)
Triglycerides: 101 mg/dL (ref 0–149)
VLDL Cholesterol Cal: 18 mg/dL (ref 5–40)

## 2018-12-19 LAB — BASIC METABOLIC PANEL
BUN/Creatinine Ratio: 14 (ref 9–23)
BUN: 8 mg/dL (ref 6–24)
CO2: 22 mmol/L (ref 20–29)
Calcium: 9.4 mg/dL (ref 8.7–10.2)
Chloride: 106 mmol/L (ref 96–106)
Creatinine, Ser: 0.59 mg/dL (ref 0.57–1.00)
GFR calc Af Amer: 117 mL/min/{1.73_m2} (ref >59)
GFR calc non Af Amer: 101 mL/min/{1.73_m2} (ref >59)
Glucose: 83 mg/dL (ref 65–99)
Potassium: 4.4 mmol/L (ref 3.5–5.2)
Sodium: 143 mmol/L (ref 134–144)

## 2019-01-02 ENCOUNTER — Other Ambulatory Visit: Payer: Self-pay | Admitting: Family Medicine

## 2019-01-10 ENCOUNTER — Ambulatory Visit (HOSPITAL_COMMUNITY)
Admission: RE | Admit: 2019-01-10 | Discharge: 2019-01-10 | Disposition: A | Discharge status: Home / Self Care | Payer: Medicare Other | Source: Ambulatory Visit | Attending: Family Medicine | Admitting: Family Medicine

## 2019-01-10 ENCOUNTER — Telehealth: Payer: Self-pay | Admitting: Family Medicine

## 2019-01-10 ENCOUNTER — Other Ambulatory Visit: Payer: Self-pay

## 2019-01-10 ENCOUNTER — Ambulatory Visit: Payer: Self-pay

## 2019-01-10 ENCOUNTER — Ambulatory Visit: Payer: Medicare Other

## 2019-01-10 DIAGNOSIS — I700 Atherosclerosis of aorta: Secondary | ICD-10-CM | POA: Insufficient documentation

## 2019-01-10 DIAGNOSIS — F17210 Nicotine dependence, cigarettes, uncomplicated: Secondary | ICD-10-CM | POA: Insufficient documentation

## 2019-01-10 DIAGNOSIS — J439 Emphysema, unspecified: Secondary | ICD-10-CM | POA: Diagnosis not present

## 2019-01-10 DIAGNOSIS — Z720 Tobacco use: Secondary | ICD-10-CM

## 2019-01-10 DIAGNOSIS — Z122 Encounter for screening for malignant neoplasm of respiratory organs: Secondary | ICD-10-CM | POA: Insufficient documentation

## 2019-01-10 NOTE — Telephone Encounter (Signed)
CT lungs discussed. Repeat screening in 1 year. Smoking cessation discussed.

## 2019-01-10 NOTE — Patient Instructions (Signed)
Visit Information  Goals Addressed            This Visit's Progress   . "I want to lose weight so I can stop taking my bp medication" (pt-stated)       Current Barriers:  [name] Knowledge Deficits related to self management of weight loss  Nurse Case Manager Clinical Goal(s):  [name] Over the next 30 days, patient will work with RN case manger to address needs related to weight loss  Interventions:  . Advised patient to to eat six small meals per day to increase her metabolism. . Patient will start to weigh once a month . She is drinking more water . She is trying to monitor her food intake  . She is walking  01/10/19 . Patient had an appointment on 12/18/18 weight was 151 lbs lost 4 lbs . Congratulated her on her efforts . Advised her to continue to monitor her food intakie . Drink water . Continue to walk  Patient Self Care Activities:  . Self administers medications as prescribed . Attends all scheduled provider appointments . Calls pharmacy for medication refills . Performs ADL's independently . Performs IADL's independently . Calls provider office for new concerns or questions . Unable to independently self manage weight loss  Please see past updates related to this goal by clicking on the "Past Updates" button in the selected goal      . "Since I started taking my bp medication I no longer check my blood pressures" (pt-stated)       Current Barriers:  [name] Knowledge Deficits related to self management of of HTN as evidenced by not checking BP at regular intervals.  Nurse Case Manager Clinical Goal(s):  [name] Over the next 14 days, patient will demonstrate improved health management independence as evidenced by the patient  checking and recording her blood pressures.  . Interventions:  . Provided education to patient re: sodium in diet . Her pressures range between 130/70 to 130/77 . She is taking her medication as prescribed . The patient has been checking her blood pressure 1  to 2 times a week and recording valuess to recognizes any abnormal patterns . Drink water . Continue with her walking  01/10/19 . Patient is checking her BP as advised per her PCP . Reviewed her medications . [name] 132/69 and lowest 113/57 . She states that realizes she has been checking her BP at the wrong times ie. Right after moving around a lot , after drinking a couple cups of coffee, or smoking. Discussed more about when to check her BP. [name] Reviewed appointment information.  Patient has an appointment with Dr. [name] 02/06/19. We decided to move our appointment out to 02/13/19.    Patient Self Care Activities:  . Attends all scheduled provider appointments . Calls provider office for new concerns or questions . Does not check her Blood pressures regularly and  record values  Please see past updates related to this goal by clicking on the "Past Updates" button in the selected goal      . I want to stop sticking my fingers three times a day (pt-stated)       GOAL: Understand your diabetes (DM) diagnosis and how to manage it  Diabetes (DM) Self-Health Management Activities:   Understand  your diabetes (DM) diagnosis and symptoms  Diabetes (DM) Definition: A condition in which there is a high level of sugar (glucose) in the blood. This may be caused by several factors.  Some Diabetes signs/symptoms:  . Increased urination . Excessive thirst and hunger . Blurred vision and weakness . Tingling in hands or feet . Sores with possible infections that heal slowly or do not heal   Things you can do: [name] Control your blood sugar, cholesterol and blood pressure through healthy eating habits and an active lifestyle . Maintain an ideal body weight    2. Know which medicines are used to manage your diabetes (DM) and take them every day   Name of Medication When I take this medication  Metformin 500 mg  Two times a day              4. Record and keep your provider  appointments   We discussed the signs and symptoms of hypoglycemia and I am glad you understand and recognize the feeling when your sugar gets low. (only had 2 episodes since she has been a diabetic) not recent Patient understand to take 6 oz of juice or 6 pieces of hard candy or glucose tablet and 15 minutes later re check your blood sugar.  Reinforced instructions regarding daily blood glucose monitoring. Reviewed medications, nutrition and treatment recommendations. Confirmed pending provider visits 12/18/18 with Dr. [name]  01/10/19 Patient is checking her blood sugars and recording daily Taking medications as prescibed A1c 12/18/18 5.8 Congratulated her on her efforts      Please see past updates related to this goal by clicking on the "Past Updates" button in the selected goal            Ms. Neally was given information about Care Management services today including:  1. Care Management services include personalized support from designated clinical staff supervised by her physician, including individualized plan of care and coordination with other care providers 2. 24/7 contact phone numbers for assistance for urgent and routine care needs. 3. The patient may stop CCM services at any time (effective at the end of the month) by phone call to the office staff.  Patient agreed to services and verbal consent obtained.   The patient verbalized understanding of instructions provided today and declined a print copy of patient instruction materials.   Face to Face appointment with care management team member scheduled for: 02/13/19 The patient has been provided with contact information for the care management team and has been advised to call with any health related questions or concerns.   Lazaro Arms RN, BSN, [facility] Care Management Coordinator [place] Phone: [phone] Fax: [phone]

## 2019-01-10 NOTE — Chronic Care Management (AMB) (Signed)
Care Management   Follow Up Note   01/10/2019 Name: Betty Chapman MRN: [id number] DOB: 03/21/1960  Referred by: [name], MD Reason for referral : Care Coordination (Care Management [place] DM II)   Betty Chapman is a 59 y.o. year old female who is a primary care patient of [name], MD. The care management team was consulted for assistance with care management and care coordination needs.    Review of patient status, including review of consultants reports, relevant laboratory and other test results, and collaboration with appropriate care team members and the patient's provider was performed as part of comprehensive patient evaluation and provision of chronic care management services.    SDOH (Social Determinants of Health) screening performed today: None. See Care Plan for related entries.   Advanced Directives: See Care Plan and Vynca application for related entries.   Goals Addressed            This Visit's Progress   . "I want to lose weight so I can stop taking my bp medication" (pt-stated)       Current Barriers:  [name] Knowledge Deficits related to self management of weight loss  Nurse Case Manager Clinical Goal(s):  [name] Over the next 30 days, patient will work with RN case manger to address needs related to weight loss  Interventions:  . Advised patient to to eat six small meals per day to increase her metabolism. . Patient will start to weigh once a month . She is drinking more water . She is trying to monitor her food intake  . She is walking  01/10/19 . Patient had an appointment on 12/18/18 weight was 151 lbs lost 4 lbs . Congratulated her on her efforts . Advised her to continue to monitor her food intakie . Drink water . Continue to walk  Patient Self Care Activities:  . Self administers medications as prescribed . Attends all scheduled provider appointments . Calls pharmacy for medication refills . Performs ADL's  independently . Performs IADL's independently . Calls provider office for new concerns or questions . Unable to independently self manage weight loss  Please see past updates related to this goal by clicking on the "Past Updates" button in the selected goal      . "Since I started taking my bp medication I no longer check my blood pressures" (pt-stated)       Current Barriers:  [name] Knowledge Deficits related to self management of of HTN as evidenced by not checking BP at regular intervals.  Nurse Case Manager Clinical Goal(s):  [name] Over the next 14 days, patient will demonstrate improved health management independence as evidenced by the patient  checking and recording her blood pressures.  . Interventions:  . Provided education to patient re: sodium in diet . Her pressures range between 130/70 to 130/77 . She is taking her medication as prescribed . The patient has been checking her blood pressure 1 to 2 times a week and recording valuess to recognizes any abnormal patterns . Drink water . Continue with her walking  01/10/19 . Patient is checking her BP as advised per her PCP . Reviewed her medications . [name] 132/69 and lowest 113/57 . She states that realizes she has been checking her BP at the wrong times ie. Right after moving around a lot , after drinking a couple cups of coffee, or smoking. Discussed more about when to check her BP. [name] Reviewed appointment information.  Patient has  an appointment with Dr. [name] 02/06/19. We decided to move our appointment out to 02/13/19.    Patient Self Care Activities:  . Attends all scheduled provider appointments . Calls provider office for new concerns or questions . Does not check her Blood pressures regularly and  record values  Please see past updates related to this goal by clicking on the "Past Updates" button in the selected goal      . I want to stop sticking my fingers three times a day (pt-stated)       GOAL: Understand  your diabetes (DM) diagnosis and how to manage it  Diabetes (DM) Self-Health Management Activities:   Understand  your diabetes (DM) diagnosis and symptoms  Diabetes (DM) Definition: A condition in which there is a high level of sugar (glucose) in the blood. This may be caused by several factors.   Some Diabetes signs/symptoms:  . Increased urination . Excessive thirst and hunger . Blurred vision and weakness . Tingling in hands or feet . Sores with possible infections that heal slowly or do not heal   Things you can do: [name] Control your blood sugar, cholesterol and blood pressure through healthy eating habits and an active lifestyle . Maintain an ideal body weight    2. Know which medicines are used to manage your diabetes (DM) and take them every day   Name of Medication When I take this medication  Metformin 500 mg  Two times a day              4. Record and keep your provider appointments   We discussed the signs and symptoms of hypoglycemia and I am glad you understand and recognize the feeling when your sugar gets low. (only had 2 episodes since she has been a diabetic) not recent Patient understand to take 6 oz of juice or 6 pieces of hard candy or glucose tablet and 15 minutes later re check your blood sugar.  Reinforced instructions regarding daily blood glucose monitoring. Reviewed medications, nutrition and treatment recommendations. Confirmed pending provider visits 12/18/18 with Dr. [name]  01/10/19 Patient is checking her blood sugars and recording daily Taking medications as prescibed A1c 12/18/18 5.8 Congratulated her on her efforts      Please see past updates related to this goal by clicking on the "Past Updates" button in the selected goal             Telephone follow up appointment with care management team member scheduled for:02/13/19 The patient has been provided with contact information for the care management team and has been advised to call  with any health related questions or concerns.   Lazaro Arms RN, BSN, [facility] Care Management Coordinator [place] Phone: [phone] Fax: [phone]

## 2019-01-10 NOTE — Chronic Care Management (AMB) (Signed)
  Care Management   Outreach Note  01/10/2019 Name: Betty Chapman MRN: [id number] DOB: [birth year]  Referred by: [name], MD Reason for referral : Care Coordination (Care Management RNCM Obesity DM II)   An unsuccessful telephone outreach was attempted today. The patient was referred to the case management team by for assistance with care management and care coordination.   Follow Up Plan: The care management team will reach out to the patient again over the next 7 days.   Lazaro Arms RN, BSN, [facility] Care Management Coordinator [place] Phone: [phone] Fax: [phone]

## 2019-01-18 ENCOUNTER — Other Ambulatory Visit: Payer: Self-pay | Admitting: Family Medicine

## 2019-01-19 ENCOUNTER — Telehealth: Payer: Medicare Other

## 2019-02-06 ENCOUNTER — Ambulatory Visit (INDEPENDENT_AMBULATORY_CARE_PROVIDER_SITE_OTHER): Payer: Medicare Other | Admitting: Family Medicine

## 2019-02-06 ENCOUNTER — Other Ambulatory Visit: Payer: Self-pay

## 2019-02-06 ENCOUNTER — Telehealth: Payer: Self-pay | Admitting: Family Medicine

## 2019-02-06 ENCOUNTER — Encounter: Payer: Self-pay | Admitting: Family Medicine

## 2019-02-06 VITALS — BP 110/60 | HR 93 | Wt 147.6 lb

## 2019-02-06 DIAGNOSIS — R634 Abnormal weight loss: Secondary | ICD-10-CM

## 2019-02-06 DIAGNOSIS — I10 Essential (primary) hypertension: Secondary | ICD-10-CM | POA: Diagnosis not present

## 2019-02-06 DIAGNOSIS — J449 Chronic obstructive pulmonary disease, unspecified: Secondary | ICD-10-CM | POA: Diagnosis not present

## 2019-02-06 DIAGNOSIS — I5030 Unspecified diastolic (congestive) heart failure: Secondary | ICD-10-CM

## 2019-02-06 DIAGNOSIS — E1169 Type 2 diabetes mellitus with other specified complication: Secondary | ICD-10-CM

## 2019-02-06 DIAGNOSIS — E785 Hyperlipidemia, unspecified: Secondary | ICD-10-CM

## 2019-02-06 HISTORY — DX: Abnormal weight loss: R63.4

## 2019-02-06 NOTE — Assessment & Plan Note (Signed)
BP elevated. Home BP report looks fine. ?? White coat HTN. Multiple repeat BP with gradual improvement. I will hold off on changing her meds for now. F/U in 2-4 weeks while monitoring BP at home closely.

## 2019-02-06 NOTE — Assessment & Plan Note (Signed)
Stable with no acute change.

## 2019-02-06 NOTE — Telephone Encounter (Signed)
I called to check in on this patient as her documented O2 Sat was low. Although expected in COPD. However, this is not a typical value for her.  I called and discussed with her and recommended coming in tomorrow for recheck. She that she has no breathing issue and will prefer not to come in.  I suspect this is a documentation error since her respiratory exam was completely normal.  Also, at the moment, over the telephone, she sounds good,not in respiratory distress. Return precaution discussed. We will readdress at her next appointment.

## 2019-02-06 NOTE — Patient Instructions (Addendum)
COVID-19 Vaccine Information can be found at: [email or web address] For questions related to vaccine distribution or appointments, please email [email or web address] or call [phone].    Blood Pressure Record Sheet To take your blood pressure, you will need a blood pressure machine. You can buy a blood pressure machine (blood pressure monitor) at your clinic, drug store, or online. When choosing one, consider:  An automatic monitor that has an arm cuff.  A cuff that wraps snugly around your upper arm. You should be able to fit only one finger between your arm and the cuff.  A device that stores blood pressure reading results.  Do not choose a monitor that measures your blood pressure from your wrist or finger. Follow your health care provider's instructions for how to take your blood pressure. To use this form:  Get one reading in the morning (a.m.) before you take any medicines.  Get one reading in the evening (p.m.) before supper.  Take at least 2 readings with each blood pressure check. This makes sure the results are correct. Wait 1-2 minutes between measurements.  Write down the results in the spaces on this form.  Repeat this once a week, or as told by your health care provider.  Make a follow-up appointment with your health care provider to discuss the results. Blood pressure log Date: _______________________  a.m. _____________________(1st reading) _____________________(2nd reading)  p.m. _____________________(1st reading) _____________________(2nd reading) Date: _______________________  a.m. _____________________(1st reading) _____________________(2nd reading)  p.m. _____________________(1st reading) _____________________(2nd reading) Date: _______________________  a.m. _____________________(1st reading) _____________________(2nd reading)  p.m. _____________________(1st reading) _____________________(2nd  reading) Date: _______________________  a.m. _____________________(1st reading) _____________________(2nd reading)  p.m. _____________________(1st reading) _____________________(2nd reading) Date: _______________________  a.m. _____________________(1st reading) _____________________(2nd reading)  p.m. _____________________(1st reading) _____________________(2nd reading) This information is not intended to replace advice given to you by your health care provider. Make sure you discuss any questions you have with your health care provider. Document Revised: 02/18/2017 Document Reviewed: 12/21/2016 Elsevier Patient Education  [place].

## 2019-02-06 NOTE — Progress Notes (Addendum)
   CHIEF COMPLAINT / HPI:  HTN/DM2:Here for f/u. Compliant with Lisinopril 20 mg qd. Also with her Metformin 500 mg BID. Her home CBG has been good. Her home BP has been good too. She came with her BP log today. She denies any concerns otherwise. She stated that she was upset for waiting in line to get COVID-19 pre-visit screen done despite arriving to the clinic on time. Hence the reason for her high BP. COPD: Compliant with her meds. Still continues to smoke. No SOB, or chest pain.  Weight loss:Patient stated she had not changed her diet and neither is she getting an exercise despite losing weight. No GI symptoms.    PERTINENT  PMH / PSH: Record reviewed   OBJECTIVE: Vitals:   02/06/19 0830 02/06/19 0846 02/06/19 0848 02/06/19 0912  BP: (!) 180/72 (!) 152/70 (!) 145/80 110/60  Pulse: 93     SpO2: 91%     Weight: 147 lb 9.6 oz (67 kg)      Physical Exam Vitals and nursing note reviewed.  Cardiovascular:     Rate and Rhythm: Normal rate and regular rhythm.     Pulses: Normal pulses.     Heart sounds: Normal heart sounds. No murmur.  Pulmonary:     Effort: Pulmonary effort is normal. No respiratory distress.     Breath sounds: Normal breath sounds. No wheezing.  Abdominal:     General: Bowel sounds are normal.     Palpations: Abdomen is soft. There is no mass.  Musculoskeletal:     Right lower leg: No edema.     Left lower leg: No edema.  Neurological:     Mental Status: She is alert.  Psychiatric:        Mood and Affect: Mood normal.          ASSESSMENT / PLAN:  Hypertension BP elevated. Home BP report looks fine. ?? White coat HTN. Multiple repeat BP with gradual improvement. I will hold off on changing her meds for now. F/U in 2-4 weeks while monitoring BP at home closely.  Diabetes mellitus Stable with no acute change.  COPD No acute flare. Her O2 Sat at >88% RA. No respiratory distress on exam. Upon review her previous O2 Sat, it has not been less  than 95% RA. I called patient to address this with her, as it was not noticed at the time of her visit.  I will check in with her again and have her return to the clinic soon for recheck. Otherwise, continue home regimen.  Unintended weight loss She is up to date with her cancer screening tests except for PAP due this month. She will reschedule. For now regular diet and regular interval recommended. We will plan on further evaluation in the next 4 weeks if she continues to lose weight. She agreed with the plan.     Andrena Mews, MD [place]  Due for PAP. She is advised to reschedule.

## 2019-02-06 NOTE — Assessment & Plan Note (Addendum)
No acute flare. Her O2 Sat at >88% RA. No respiratory distress on exam. Upon review her previous O2 Sat, it has not been less than 95% RA. I called patient to address this with her, as it was not noticed at the time of her visit.  I will check in with her again and have her return to the clinic soon for recheck. Otherwise, continue home regimen.

## 2019-02-06 NOTE — Assessment & Plan Note (Signed)
She is up to date with her cancer screening tests except for PAP due this month. She will reschedule. For now regular diet and regular interval recommended. We will plan on further evaluation in the next 4 weeks if she continues to lose weight. She agreed with the plan.

## 2019-02-13 ENCOUNTER — Other Ambulatory Visit: Payer: Self-pay

## 2019-02-13 ENCOUNTER — Telehealth: Payer: Medicare Other

## 2019-02-16 ENCOUNTER — Other Ambulatory Visit: Payer: Self-pay

## 2019-02-16 ENCOUNTER — Ambulatory Visit: Payer: Medicare Other

## 2019-02-18 NOTE — Patient Instructions (Signed)
Visit Information  Goals Addressed            This Visit's Progress   . COMPLETED: "[name] want to stop smoking right now" (pt-stated)       Current Barriers:  . Chronic Disease Management support and education needs related to smoking cessation and chronic diease  Patient states that she  is not ready to stop smoking  Nurse Case Manager Clinical Goal(s):  [name] Over the next 60 days, patient will verbalize basic understanding of relationship of smoking and chronic diease  Interventions:  . Advised patient to consider smoking cessation . Provided education to patient re: cessation stratagies . Discussed plans with patient for ongoing care management follow up and provided patient with direct contact information for care management team  Patient Self Care Activities:  . Attends all scheduled provider appointments . Performs ADL's independently . Calls provider office for new concerns or questions . Unable to independently stop smoking  Initial goal documentation     . "I want to lose weight so I can stop taking my bp medication" (pt-stated)       Current Barriers:  [name] Knowledge Deficits related to self management of weight loss  Nurse Case Manager Clinical Goal(s):  [name] Over the next 30 days, patient will work with RN case manger to address needs related to weight loss  Interventions:  . Advised patient to to eat six small meals per day to increase her metabolism. . Patient will start to weigh once a month . She is drinking more water . She is trying to monitor her food intake  . She is walking   01/10/19 . Patient had an appointment on 12/18/18 weight was 151 lbs lost 4 lbs . Congratulated her on her efforts . Advised her to continue to monitor her food intakie . Drink water  . 02/16/19 . Patient states that she is still eating well . She is exercise in her daily chores  her weight is 147 lbs. . Continues to drink water Continue to walk .   Patient Self Care Activities:   . Self administers medications as prescribed . Attends all scheduled provider appointments . Calls pharmacy for medication refills . Performs ADL's independently . Performs IADL's independently . Calls provider office for new concerns or questions . Unable to independently self manage weight loss  Please see past updates related to this goal by clicking on the "Past Updates" button in the selected goal      . "Since I started taking my bp medication I no longer check my blood pressures" (pt-stated)       Current Barriers:  [name] Knowledge Deficits related to self management of of HTN as evidenced by not checking BP at regular intervals.  Nurse Case Manager Clinical Goal(s):  [name] Over the next 14 days, patient will demonstrate improved health management independence as evidenced by the patient  checking and recording her blood pressures.  . Interventions:  . Provided education to patient re: sodium in diet . Her pressures range between 130/70 to 130/77 . She is taking her medication as prescribed . The patient has been checking her blood pressure 1 to 2 times a week and recording valuess to recognizes any abnormal patterns . Drink water . Continue with her walking  01/10/19 . Patient is checking her BP as advised per her PCP . Reviewed her medications . [name] 132/69 and lowest 113/57 . She states that realizes she has been checking her BP at  the wrong times ie. Right after moving around a lot , after drinking a couple cups of coffee, or smoking. Discussed more about when to check her BP. [name] Reviewed appointment information.  Patient has an appointment with Dr. [name] 02/06/19. We decided to move our appointment out to 02/13/19.  . 02/16/19 . Patient states that she went to her appointment on 2/2 and took her BP readings  BP readings at home have been lower because she states that she takes it when she waits 5 min to relax , she has not drank caffeine or smoked a cigarette and has  noticed that her blood pressure is lower.  She feels it is higher when she is upset or moving around and has white coat syndrome. . She has not checked her BP recently but will start rechecking it. . Continue to monitor the sodium in her diet and exercise      Patient Self Care Activities:  . Attends all scheduled provider appointments . Calls provider office for new concerns or questions . Does not check her Blood pressures regularly and  record values  Please see past updates related to this goal by clicking on the "Past Updates" button in the selected goal      . I want to stop sticking my fingers three times a day (pt-stated)       GOAL: Understand your diabetes (DM) diagnosis and how to manage it  Diabetes (DM) Self-Health Management Activities:   Understand  your diabetes (DM) diagnosis and symptoms  Diabetes (DM) Definition: A condition in which there is a high level of sugar (glucose) in the blood. This may be caused by several factors.   Some Diabetes signs/symptoms:  . Increased urination . Excessive thirst and hunger . Blurred vision and weakness . Tingling in hands or feet . Sores with possible infections that heal slowly or do not heal   Things you can do: [name] Control your blood sugar, cholesterol and blood pressure through healthy eating habits and an active lifestyle . Maintain an ideal body weight . Continue to check blood sugar - blood sugars range 89-110    2. Know which medicines are used to manage your diabetes (DM) and take them every day   Name of Medication When I take this medication  Metformin 500 mg  Two times a day              4. Record and keep your provider appointments   We discussed the signs and symptoms of hypoglycemia and I am glad you understand and recognize the feeling when your sugar gets low. (only had 2 episodes since she has been a diabetic) not recent Patient understand to take 6 oz of juice or 6 pieces of hard candy or  glucose tablet and 15 minutes later re check your blood sugar.  Reinforced instructions regarding daily blood glucose monitoring. Reviewed medications, nutrition and treatment recommendations. Confirmed pending provider visits 12/18/18 with Dr. [name]  01/10/19 Patient is checking her blood sugars and recording daily Taking medications as prescibed A1c 12/18/18 5.8 Congratulated her on her efforts   02/16/19 Checking and recording bloo sugars daily Taking medications  Range 89-110     Please see past updates related to this goal by clicking on the "Past Updates" button in the selected goal            Betty Chapman was given information about Care Management services today including:  1. Care Management services include personalized support from designated  clinical staff supervised by her physician, including individualized plan of care and coordination with other care providers 2. 24/7 contact phone numbers for assistance for urgent and routine care needs. 3. The patient may stop CCM services at any time (effective at the end of the month) by phone call to the office staff.  Patient agreed to services and verbal consent obtained.   The patient verbalized understanding of instructions provided today and declined a print copy of patient instruction materials.   The care management team will reach out to the patient again over the next 30 days.  The patient has been provided with contact information for the care management team and has been advised to call with any health related questions or concerns.   Lazaro Arms RN, BSN, [facility] Care Management Coordinator [place] Phone: [phone] Fax: [phone]

## 2019-02-18 NOTE — Chronic Care Management (AMB) (Signed)
Care Management   Follow Up Note   02/18/2019 Name: Betty Chapman MRN: [id number] DOB: 1960-06-18  Referred by: Betty Feil, MD Reason for referral : Care Coordination (Care Management RNCM DM II/ HTn )   Betty Chapman is a 59 y.o. year old female who is a primary care patient of Betty Feil, MD. The care management team was consulted for assistance with care management and care coordination needs.    Review of patient status, including review of consultants reports, relevant laboratory and other test results, and collaboration with appropriate care team members and the patient's provider was performed as part of comprehensive patient evaluation and provision of chronic care management services.    SDOH (Social Determinants of Health) screening performed today: None. See Care Plan for related entries.   Advanced Directives: See Care Plan and Vyn[employer] application for related entries.   Goals Addressed            This Visit's Progress   . COMPLETED: "[name] want to stop smoking right now" (pt-stated)       Current Barriers:  . Chronic Disease Management support and education needs related to smoking cessation and chronic diease  Patient states that she  is not ready to stop smoking  Nurse Case Manager Clinical Goal(s):  [name] Over the next 60 days, patient will verbalize basic understanding of relationship of smoking and chronic diease  Interventions:  . Advised patient to consider smoking cessation . Provided education to patient re: cessation stratagies . Discussed plans with patient for ongoing care management follow up and provided patient with direct contact information for care management team  Patient Self Care Activities:  . Attends all scheduled provider appointments . Performs ADL's independently . Calls provider office for new concerns or questions . Unable to independently stop smoking  Initial goal documentation     . "I want to lose weight so  I can stop taking my bp medication" (pt-stated)       Current Barriers:  [name] Knowledge Deficits related to self management of weight loss  Nurse Case Manager Clinical Goal(s):  [name] Over the next 30 days, patient will work with RN case manger to address needs related to weight loss  Interventions:  . Advised patient to to eat six small meals per day to increase her metabolism. . Patient will start to weigh once a month . She is drinking more water . She is trying to monitor her food intake  . She is walking   01/10/19 . Patient had an appointment on 12/18/18 weight was 151 lbs lost 4 lbs . Congratulated her on her efforts . Advised her to continue to monitor her food intakie . Drink water  . 02/16/19 . Patient states that she is still eating well . She is exercise in her daily chores  her weight is 147 lbs. . Continues to drink water Continue to walk .   Patient Self Care Activities:  . Self administers medications as prescribed . Attends all scheduled provider appointments . Calls pharmacy for medication refills . Performs ADL's independently . Performs IADL's independently . Calls provider office for new concerns or questions . Unable to independently self manage weight loss  Please see past updates related to this goal by clicking on the "Past Updates" button in the selected goal      . "Since I started taking my bp medication I no longer check my blood pressures" (pt-stated)       Current  Barriers:  [name] Knowledge Deficits related to self management of of HTN as evidenced by not checking BP at regular intervals.  Nurse Case Manager Clinical Goal(s):  [name] Over the next 14 days, patient will demonstrate improved health management independence as evidenced by the patient  checking and recording her blood pressures.  . Interventions:  . Provided education to patient re: sodium in diet . Her pressures range between 130/70 to 130/77 . She is taking her medication as  prescribed . The patient has been checking her blood pressure 1 to 2 times a week and recording valuess to recognizes any abnormal patterns . Drink water . Continue with her walking  01/10/19 . Patient is checking her BP as advised per her PCP . Reviewed her medications . [name] 132/69 and lowest 113/57 . She states that realizes she has been checking her BP at the wrong times ie. Right after moving around a lot , after drinking a couple cups of coffee, or smoking. Discussed more about when to check her BP. [name] Reviewed appointment information.  Patient has an appointment with Dr. [name] 02/06/19. We decided to move our appointment out to 02/13/19.  . 02/16/19 . Patient states that she went to her appointment on 2/2 and took her BP readings  BP readings at home have been lower because she states that she takes it when she waits 5 min to relax , she has not drank caffeine or smoked a cigarette and has noticed that her blood pressure is lower.  She feels it is higher when she is upset or moving around and has white coat syndrome. . She has not checked her BP recently but will start rechecking it. . Continue to monitor the sodium in her diet and exercise      Patient Self Care Activities:  . Attends all scheduled provider appointments . Calls provider office for new concerns or questions . Does not check her Blood pressures regularly and  record values  Please see past updates related to this goal by clicking on the "Past Updates" button in the selected goal      . I want to stop sticking my fingers three times a day (pt-stated)       GOAL: Understand your diabetes (DM) diagnosis and how to manage it  Diabetes (DM) Self-Health Management Activities:   Understand  your diabetes (DM) diagnosis and symptoms  Diabetes (DM) Definition: A condition in which there is a high level of sugar (glucose) in the blood. This may be caused by several factors.   Some Diabetes signs/symptoms:   . Increased urination . Excessive thirst and hunger . Blurred vision and weakness . Tingling in hands or feet . Sores with possible infections that heal slowly or do not heal   Things you can do: [name] Control your blood sugar, cholesterol and blood pressure through healthy eating habits and an active lifestyle . Maintain an ideal body weight . Continue to check blood sugar - blood sugars range 89-110    2. Know which medicines are used to manage your diabetes (DM) and take them every day   Name of Medication When I take this medication  Metformin 500 mg  Two times a day              4. Record and keep your provider appointments   We discussed the signs and symptoms of hypoglycemia and I am glad you understand and recognize the feeling when your sugar gets low. (only had  2 episodes since she has been a diabetic) not recent Patient understand to take 6 oz of juice or 6 pieces of hard candy or glucose tablet and 15 minutes later re check your blood sugar.  Reinforced instructions regarding daily blood glucose monitoring. Reviewed medications, nutrition and treatment recommendations. Confirmed pending provider visits 12/18/18 with Dr. [name]  01/10/19 Patient is checking her blood sugars and recording daily Taking medications as prescibed A1c 12/18/18 5.8 Congratulated her on her efforts   02/16/19 Checking and recording bloo sugars daily Taking medications  Range 89-110     Please see past updates related to this goal by clicking on the "Past Updates" button in the selected goal             The care management team will reach out to the patient again over the next 30 days.  The patient has been provided with contact information for the care management team and has been advised to call with any health related questions or concerns.   Lazaro Arms RN, BSN, [facility] Care Management Coordinator [place] Phone: [phone] Fax: [phone]

## 2019-03-01 DIAGNOSIS — E113291 Type 2 diabetes mellitus with mild nonproliferative diabetic retinopathy without macular edema, right eye: Secondary | ICD-10-CM | POA: Diagnosis not present

## 2019-03-01 LAB — HM DIABETES EYE EXAM

## 2019-03-16 ENCOUNTER — Ambulatory Visit: Payer: Medicare Other

## 2019-03-16 ENCOUNTER — Other Ambulatory Visit: Payer: Self-pay

## 2019-03-16 NOTE — Chronic Care Management (AMB) (Signed)
Care Management   Follow Up Note   03/16/2019 Name: Betty Chapman MRN: [id number] DOB: 1960-03-20  Referred by: [name], MD Reason for referral : Care Coordination (Care Management DM II/HTN )   Betty Chapman is a 59 y.o. year old female who is a primary care patient of [name], MD. The care management team was consulted for assistance with care management and care coordination needs.    Review of patient status, including review of consultants reports, relevant laboratory and other test results, and collaboration with appropriate care team members and the patient's provider was performed as part of comprehensive patient evaluation and provision of chronic care management services.    SDOH (Social Determinants of Health) assessments performed: No See Care Plan activities for detailed interventions related to [facility])     Advanced Directives: See Care Plan and Vynca application for related entries.   Goals Addressed            This Visit's Progress   . "I want to lose weight so I can stop taking my bp medication" (pt-stated)       Current Barriers:  [name] Knowledge Deficits related to self management of weight loss  Nurse Case Manager Clinical Goal(s):  [name] Over the next 30 days, patient will work with RN case manger to address needs related to weight loss  Interventions:  . Advised patient to to eat six small meals per day to increase her metabolism. . Patient will start to weigh once a month . She is drinking more water . She is trying to monitor her food intake  . She is walking   01/10/19 . Patient had an appointment on 12/18/18 weight was 151 lbs lost 4 lbs . Congratulated her on her efforts . Advised her to continue to monitor her food intakie . Drink water  . 02/16/19 . Patient states that she is still eating well . She is exercise in her daily chores  her weight is 147 lbs. . Continues to drink water Continue to  walk  . 03/16/19 . Patient states that she has not been checking her blood pressure . It was checked when she had her eyes checked and she states it was 112/63 . She has an appointment with Dr [name] on  Patient Self Care Activities:  . Self administers medications as prescribed . Attends all scheduled provider appointments . Calls pharmacy for medication refills . Performs ADL's independently . Performs IADL's independently . Calls provider office for new concerns or questions . Unable to independently self manage weight loss  Please see past updates related to this goal by clicking on the "Past Updates" button in the selected goal      . I want to stop sticking my fingers three times a day (pt-stated)       GOAL: Understand your diabetes (DM) diagnosis and how to manage it  Diabetes (DM) Self-Health Management Activities:   Understand  your diabetes (DM) diagnosis and symptoms  Diabetes (DM) Definition: A condition in which there is a high level of sugar (glucose) in the blood. This may be caused by several factors.   Some Diabetes signs/symptoms:  . Increased urination . Excessive thirst and hunger . Blurred vision and weakness . Tingling in hands or feet . Sores with possible infections that heal slowly or do not heal   Things you can do: [name] Control your blood sugar, cholesterol and blood pressure through healthy eating habits and an active lifestyle .  Maintain an ideal body weight . Continue to check blood sugar - blood sugars range 89-110    2. Know which medicines are used to manage your diabetes (DM) and take them every day   Name of Medication When I take this medication  Metformin 500 mg  Two times a day              4. Record and keep your provider appointments   We discussed the signs and symptoms of hypoglycemia and I am glad you understand and recognize the feeling when your sugar gets low. (only had 2 episodes since she has been a diabetic) not  recent Patient understand to take 6 oz of juice or 6 pieces of hard candy or glucose tablet and 15 minutes later re check your blood sugar.  Reinforced instructions regarding daily blood glucose monitoring. Reviewed medications, nutrition and treatment recommendations. Confirmed pending provider visits 12/18/18 with Dr. [name]  01/10/19 Patient is checking her blood sugars and recording daily Taking medications as prescibed A1c 12/18/18 5.8 Congratulated her on her efforts   02/16/19 Checking and recording blood sugars daily Taking medications  Range 89-110  03/16/19 Checking blood sugars 3 times daily Blood sugar this morning 110     Please see past updates related to this goal by clicking on the "Past Updates" button in the selected goal             The care management team will reach out to the patient again over the next 21 days.  The patient has been provided with contact information for the care management team and has been advised to call with any health related questions or concerns.   Lazaro Arms RN, BSN, [facility] Care Management Coordinator [place] Phone: [phone] Fax: [phone]

## 2019-03-16 NOTE — Patient Instructions (Signed)
Visit Information  Goals Addressed            This Visit's Progress   . "I want to lose weight so I can stop taking my bp medication" (pt-stated)       Current Barriers:  [name] Knowledge Deficits related to self management of weight loss  Nurse Case Manager Clinical Goal(s):  [name] Over the next 30 days, patient will work with RN case manger to address needs related to weight loss  Interventions:  . Advised patient to to eat six small meals per day to increase her metabolism. . Patient will start to weigh once a month . She is drinking more water . She is trying to monitor her food intake  . She is walking   01/10/19 . Patient had an appointment on 12/18/18 weight was 151 lbs lost 4 lbs . Congratulated her on her efforts . Advised her to continue to monitor her food intakie . Drink water  . 02/16/19 . Patient states that she is still eating well . She is exercise in her daily chores  her weight is 147 lbs. . Continues to drink water Continue to walk  . 03/16/19 . Patient states that she has not been checking her blood pressure . It was checked when she had her eyes checked and she states it was 112/63 . She has an appointment with Dr [name] on  Patient Self Care Activities:  . Self administers medications as prescribed . Attends all scheduled provider appointments . Calls pharmacy for medication refills . Performs ADL's independently . Performs IADL's independently . Calls provider office for new concerns or questions . Unable to independently self manage weight loss  Please see past updates related to this goal by clicking on the "Past Updates" button in the selected goal      . I want to stop sticking my fingers three times a day (pt-stated)       GOAL: Understand your diabetes (DM) diagnosis and how to manage it  Diabetes (DM) Self-Health Management Activities:   Understand  your diabetes (DM) diagnosis and symptoms  Diabetes (DM) Definition: A condition in which  there is a high level of sugar (glucose) in the blood. This may be caused by several factors.   Some Diabetes signs/symptoms:  . Increased urination . Excessive thirst and hunger . Blurred vision and weakness . Tingling in hands or feet . Sores with possible infections that heal slowly or do not heal   Things you can do: [name] Control your blood sugar, cholesterol and blood pressure through healthy eating habits and an active lifestyle . Maintain an ideal body weight . Continue to check blood sugar - blood sugars range 89-110    2. Know which medicines are used to manage your diabetes (DM) and take them every day   Name of Medication When I take this medication  Metformin 500 mg  Two times a day              4. Record and keep your provider appointments   We discussed the signs and symptoms of hypoglycemia and I am glad you understand and recognize the feeling when your sugar gets low. (only had 2 episodes since she has been a diabetic) not recent Patient understand to take 6 oz of juice or 6 pieces of hard candy or glucose tablet and 15 minutes later re check your blood sugar.  Reinforced instructions regarding daily blood glucose monitoring. Reviewed medications, nutrition and treatment recommendations. Confirmed pending  provider visits 12/18/18 with Dr. [name]  01/10/19 Patient is checking her blood sugars and recording daily Taking medications as prescibed A1c 12/18/18 5.8 Congratulated her on her efforts   02/16/19 Checking and recording blood sugars daily Taking medications  Range 89-110  03/16/19 Checking blood sugars 3 times daily Blood sugar this morning 110     Please see past updates related to this goal by clicking on the "Past Updates" button in the selected goal            Ms. Leggitt was given information about Care Management services today including:  1. Care Management services include personalized support from designated clinical staff supervised  by her physician, including individualized plan of care and coordination with other care providers 2. 24/7 contact phone numbers for assistance for urgent and routine care needs. 3. The patient may stop CCM services at any time (effective at the end of the month) by phone call to the office staff.  Patient agreed to services and verbal consent obtained.   The patient verbalized understanding of instructions provided today and declined a print copy of patient instruction materials.   The care management team will reach out to the patient again over the next 21 days.  The patient has been provided with contact information for the care management team and has been advised to call with any health related questions or concerns.   Lazaro Arms RN, BSN, [facility] Care Management Coordinator [place] Phone: [phone] Fax: [phone]

## 2019-03-21 ENCOUNTER — Other Ambulatory Visit: Payer: Self-pay | Admitting: Family Medicine

## 2019-03-23 ENCOUNTER — Ambulatory Visit (INDEPENDENT_AMBULATORY_CARE_PROVIDER_SITE_OTHER): Payer: Medicare Other | Admitting: Family Medicine

## 2019-03-23 ENCOUNTER — Other Ambulatory Visit: Payer: Self-pay

## 2019-03-23 ENCOUNTER — Encounter: Payer: Self-pay | Admitting: Family Medicine

## 2019-03-23 VITALS — BP 134/72 | HR 76 | Ht 60.0 in | Wt 147.4 lb

## 2019-03-23 DIAGNOSIS — J449 Chronic obstructive pulmonary disease, unspecified: Secondary | ICD-10-CM

## 2019-03-23 DIAGNOSIS — G8929 Other chronic pain: Secondary | ICD-10-CM

## 2019-03-23 DIAGNOSIS — F339 Major depressive disorder, recurrent, unspecified: Secondary | ICD-10-CM

## 2019-03-23 DIAGNOSIS — M549 Dorsalgia, unspecified: Secondary | ICD-10-CM | POA: Diagnosis not present

## 2019-03-23 DIAGNOSIS — I10 Essential (primary) hypertension: Secondary | ICD-10-CM

## 2019-03-23 DIAGNOSIS — R634 Abnormal weight loss: Secondary | ICD-10-CM | POA: Diagnosis not present

## 2019-03-23 DIAGNOSIS — E1169 Type 2 diabetes mellitus with other specified complication: Secondary | ICD-10-CM | POA: Diagnosis not present

## 2019-03-23 LAB — POCT GLYCOSYLATED HEMOGLOBIN (HGB A1C): HbA1c, POC (controlled diabetic range): 6 % (ref 0.0–7.0)

## 2019-03-23 MED ORDER — BACLOFEN 10 MG PO TABS: 10.0000 mg | ORAL_TABLET | Freq: Two times a day (BID) | ORAL | 1 refills | Status: DC | PRN

## 2019-03-23 MED ORDER — IBUPROFEN 400 MG PO TABS: 400.0000 mg | ORAL_TABLET | Freq: Three times a day (TID) | ORAL | 1 refills | Status: DC | PRN

## 2019-03-23 NOTE — Assessment & Plan Note (Signed)
No acute change

## 2019-03-23 NOTE — Assessment & Plan Note (Signed)
A1C remains optimal at 6.0 Option given to continue Metformin daily vs drug holiday.  She prefers drug holiday with diet control and exercise. Continue home CBG monitoring. Parameters given as well as indication for early f/u depending on her CBG range. F/U in 4 weeks.

## 2019-03-23 NOTE — Progress Notes (Signed)
Patient ID: Betty Chapman, female   DOB: 1960/08/23, 59 y.o.   MRN: [id number]    SUBJECTIVE:   CHIEF COMPLAINT / HPI:  Back pain: C/O Back pain which started yesterday while she was mopping the flow. She has hx of chronic back pain, which got aggravated yesterday. Her pain was sharp, about 10/10 in severity but improved today to about 7/10  In severity.  Pain is worse on the left side. She used a heating pad and hot shower to improve her pain. No other concerns. HTN/DM2: Here for f/u. She is compliant with her Metformin, diet, and exercise as tolerated for weight maintenance. Her home CBG had never been greater than 110. She feels well in general. Weight loss: She feels her weight has been stable. She is here for f/u. MDD: Feels well. No depression. COPD: Denies SOB.  PAP due: Here for F/U.  PERTINENT  PMH / PSH:  Reviewed  OBJECTIVE:   Vitals:   03/23/19 0832  BP: 134/72  Pulse: 76  SpO2: 94%  Weight: 147 lb 6.4 oz (66.9 kg)  Height: 5' (1.524 m)    BP 134/72   Pulse 76   Ht 5' (1.524 m)   Wt 147 lb 6.4 oz (66.9 kg)   LMP 12/27/2011   SpO2 94%   BMI 28.79 kg/m   Physical Exam Vitals and nursing note reviewed.  Cardiovascular:     Rate and Rhythm: Normal rate and regular rhythm.     Heart sounds: Normal heart sounds. No murmur.  Pulmonary:     Effort: Pulmonary effort is normal. No respiratory distress.     Breath sounds: Normal breath sounds. No wheezing.  Abdominal:     General: Bowel sounds are normal.     Palpations: Abdomen is soft. There is no mass.     Tenderness: There is no abdominal tenderness.  Musculoskeletal:     Right lower leg: No edema.     Left lower leg: No edema.  Neurological:     Mental Status: She is alert and oriented to person, place, and time.      Office Visit from 03/23/2019 in [place]  PHQ-9 Total Score  4        ASSESSMENT/PLAN:   Chronic back pain Acute on chronic lumbar pain. Aggravated by  bending yesterday. Hx of disc herniation. PT recommended, but she prefers conservative measures for now. Home exercise discussed. Start Ibuprofen and continue Baclofen as needed. F/U if there is no improvement.   Diabetes mellitus A1C remains optimal at 6.0 Option given to continue Metformin daily vs drug holiday.  She prefers drug holiday with diet control and exercise. Continue home CBG monitoring. Parameters given as well as indication for early f/u depending on her CBG range. F/U in 4 weeks.  Hypertension BP optimal.  Unintended weight loss Weight stable. Monitor closely.  Major depression Doing well off meds.  COPD (chronic obstructive pulmonary disease) (HCC) No acute change.    She will call back for PAP appointment.  Andrena Mews, MD [place]

## 2019-03-23 NOTE — Assessment & Plan Note (Signed)
BP optimal 

## 2019-03-23 NOTE — Progress Notes (Signed)
a1c

## 2019-03-23 NOTE — Patient Instructions (Signed)
It was nice seeing you today. Your A1C looks good. You can hold off on taking Metformin for now. Normal fasting glucose should be between 80-100, however and I ok if your fasting is less than 125-130  Your glucose 2-3 hrs after eating can range from 120-140.  Continue home CBG check and see me back in 4 weeks for both DM and PAP.

## 2019-03-23 NOTE — Assessment & Plan Note (Signed)
Doing well off meds.

## 2019-03-23 NOTE — Assessment & Plan Note (Signed)
Weight stable. Monitor closely.

## 2019-03-23 NOTE — Assessment & Plan Note (Addendum)
Acute on chronic lumbar pain. Aggravated by bending yesterday. Hx of disc herniation. PT recommended, but she prefers conservative measures for now. Home exercise discussed. Start Ibuprofen and continue Baclofen as needed. F/U if there is no improvement.

## 2019-04-04 ENCOUNTER — Ambulatory Visit: Payer: Medicare Other

## 2019-04-04 ENCOUNTER — Other Ambulatory Visit: Payer: Self-pay

## 2019-04-07 NOTE — Chronic Care Management (AMB) (Signed)
Care Management   Follow Up Note   04/07/2019 Name: Betty Chapman MRN: [id number] DOB: 1960/07/15  Referred by: Betty Feil, MD Reason for referral : Care Coordination (Care Management RNCM DM II HTN)   Betty Chapman is a 59 y.o. year old female who is a primary care patient of Betty Feil, MD. The care management team was consulted for assistance with care management and care coordination needs.    Review of patient status, including review of consultants reports, relevant laboratory and other test results, and collaboration with appropriate care team members and the patient's provider was performed as part of comprehensive patient evaluation and provision of chronic care management services.    SDOH (Social Determinants of Health) assessments performed: No See Care Plan activities for detailed interventions related to [facility])     Advanced Directives: See Care Plan and Vynca application for related entries.   Goals Addressed            This Visit's Progress   . "I want to lose weight so I can stop taking my bp medication" (pt-stated)       Current Barriers:  Betty Chapman Knowledge Deficits related to self management of weight loss  Nurse Case Manager Clinical Goal(s):  Betty Chapman Over the next 30 days, patient will work with RN case manger to address needs related to weight loss  Interventions:  . Advised patient to to eat six small meals per day to increase her metabolism. . Patient will start to weigh once a month . She is drinking more water . She is trying to monitor her food intake  . She is walking     . 01/10/19 . Patient had an appointment on 12/18/18 weight was 151 lbs lost 4 lbs . Congratulated her on her efforts . Advised her to continue to monitor her food intakie . Drink water .  Betty Chapman 02/16/19 . Patient states that she went to her appointment on 2/2 and took her BP readings .  BP readings at home have been lower because she states that she takes it when  she waits 5 min to relax , she has not drank caffeine or smoked a cigarette and has noticed that her blood pressure is lower.  She feels it is higher when she is upset or moving around and has white coat syndrome. . She has not checked her BP recently but will start rechecking it. . Continue to monitor the sodium in her diet and exercise . Patient states that she is still eating well . She is exercise in her daily chores  her weight is 147 lbs. . Continues to drink water Continue to walk  . 03/16/19 . Patient states that she has not been checking her blood pressure . It was checked when she had her eyes checked and she states it was 112/63  Patient Self Care Activities:  . Self administers medications as prescribed . Attends all scheduled provider appointments . Calls pharmacy for medication refills . Performs ADL's independently . Performs IADL's independently . Calls provider office for new concerns or questions . Unable to independently self manage weight loss  Please see past updates related to this goal by clicking on the "Past Updates" button in the selected goal      . "Since I started taking my bp medication I no longer check my blood pressures" (pt-stated)       Current Barriers:  Betty Chapman Knowledge Deficits related to self management of of HTN as evidenced  by not checking BP at regular intervals.  Nurse Case Manager Clinical Goal(s):  Betty Chapman Over the next 90 days, patient will demonstrate improved health management independence as evidenced by the patient  checking and recording her blood pressures.   Patient Self Care Activities:  . Attends all scheduled provider appointments . Calls provider office for new concerns or questions . Does not check her Blood pressures regularly and  record values  . Interventions:  . Provided education to patient re: sodium in diet . Her pressures range between 130/70 to 130/77 . She is taking her medication as prescribed . The patient has been  checking her blood pressure 1 to 2 times a week and recording valuess to recognizes any abnormal patterns . Drink water . Continue with her walking  . 01/10/19 . Patient is checking her BP as advised per her PCP . Reviewed her medications . [name] 132/69 and lowest 113/57 . She states that realizes she has been checking her BP at the wrong times ie. Right after moving around a lot , after drinking a couple cups of coffee, or smoking. Discussed more about when to check her BP. Betty Chapman Reviewed appointment information.  Patient has an appointment with Dr. [name] 02/06/19. We decided to move our appointment out to 02/13/19. . 04/04/19 . Patient states that she has not checked her BP since she has been in the office.  I asked her will she check and have value for me the next time I call and she stated that she would.  Please see past updates related to this goal by clicking on the "Past Updates" button in the selected goal      . I want to stop sticking my fingers three times a day (pt-stated)       GOAL: Understand your diabetes (DM) diagnosis and how to manage it  Diabetes (DM) Self-Health Management Activities:   Understand  your diabetes (DM) diagnosis and symptoms  Diabetes (DM) Definition: A condition in which there is a high level of sugar (glucose) in the blood. This may be caused by several factors.   Some Diabetes signs/symptoms:  . Increased urination . Excessive thirst and hunger . Blurred vision and weakness . Tingling in hands or feet . Sores with possible infections that heal slowly or do not heal   Things you can do: Betty Chapman Control your blood sugar, cholesterol and blood pressure through healthy eating habits and an active lifestyle . Maintain an ideal body weight . Continue to check blood sugar - blood sugars range 89-110    2. Know which medicines are used to manage your diabetes (DM) and take them every day   Name of Medication When I take this medication  Metformin 500  mg  Two times a day              4. Record and keep your provider appointments   We discussed the signs and symptoms of hypoglycemia and I am glad you understand and recognize the feeling when your sugar gets low. (only had 2 episodes since she has been a diabetic) not recent Patient understand to take 6 oz of juice or 6 pieces of hard candy or glucose tablet and 15 minutes later re check your blood sugar.  Reinforced instructions regarding daily blood glucose monitoring. Reviewed medications, nutrition and treatment recommendations. Confirmed pending provider visits 12/18/18 with Dr. [name]  01/10/19 Patient is checking her blood sugars and recording daily Taking medications as prescibed A1c 12/18/18 5.8  Congratulated her on her efforts   02/16/19 Checking and recording blood sugars daily Taking medications  Range 89-110  03/16/19 Checking blood sugars 3 times daily Blood sugar this morning 110  04/04/19 ~Patient states at her last visit in the office with her blood sugars staying at 110 and a1c aaround 6.0 she was [name] drug holiday fro 2 weeks with contiuous monitoring of her blood sugars with diet and exercise.   ~She states that her blood sugar this morning was 109 and she had just eaten andd will check her blood sugar in two hours. ~  she will have her blood sugar re checked in the office when she comes back in for a pap in 2 weeks to see how she has been manageing. Advised her to follow the plan given her by her PCP.     Please see past updates related to this goal by clicking on the "Past Updates" button in the selected goal             The care management team will reach out to the patient again over the next 14 days.  The patient has been provided with contact information for the care management team and has been advised to call with any health related questions or concerns.   Lazaro Arms RN, BSN, [facility] Care Management Coordinator [place] Phone: [phone] Fax: [phone]

## 2019-04-07 NOTE — Patient Instructions (Signed)
Visit Information  Goals Addressed            This Visit's Progress   . "I want to lose weight so I can stop taking my bp medication" (pt-stated)       Current Barriers:  [name] Knowledge Deficits related to self management of weight loss  Nurse Case Manager Clinical Goal(s):  [name] Over the next 30 days, patient will work with RN case manger to address needs related to weight loss  Interventions:  . Advised patient to to eat six small meals per day to increase her metabolism. . Patient will start to weigh once a month . She is drinking more water . She is trying to monitor her food intake  . She is walking     . 01/10/19 . Patient had an appointment on 12/18/18 weight was 151 lbs lost 4 lbs . Congratulated her on her efforts . Advised her to continue to monitor her food intakie . Drink water .  [name] 02/16/19 . Patient states that she went to her appointment on 2/2 and took her BP readings .  BP readings at home have been lower because she states that she takes it when she waits 5 min to relax , she has not drank caffeine or smoked a cigarette and has noticed that her blood pressure is lower.  She feels it is higher when she is upset or moving around and has white coat syndrome. . She has not checked her BP recently but will start rechecking it. . Continue to monitor the sodium in her diet and exercise . Patient states that she is still eating well . She is exercise in her daily chores  her weight is 147 lbs. . Continues to drink water Continue to walk  . 03/16/19 . Patient states that she has not been checking her blood pressure . It was checked when she had her eyes checked and she states it was 112/63  Patient Self Care Activities:  . Self administers medications as prescribed . Attends all scheduled provider appointments . Calls pharmacy for medication refills . Performs ADL's independently . Performs IADL's independently . Calls provider office for new concerns or  questions . Unable to independently self manage weight loss  Please see past updates related to this goal by clicking on the "Past Updates" button in the selected goal      . "Since I started taking my bp medication I no longer check my blood pressures" (pt-stated)       Current Barriers:  [name] Knowledge Deficits related to self management of of HTN as evidenced by not checking BP at regular intervals.  Nurse Case Manager Clinical Goal(s):  [name] Over the next 90 days, patient will demonstrate improved health management independence as evidenced by the patient  checking and recording her blood pressures.   Patient Self Care Activities:  . Attends all scheduled provider appointments . Calls provider office for new concerns or questions . Does not check her Blood pressures regularly and  record values  . Interventions:  . Provided education to patient re: sodium in diet . Her pressures range between 130/70 to 130/77 . She is taking her medication as prescribed . The patient has been checking her blood pressure 1 to 2 times a week and recording valuess to recognizes any abnormal patterns . Drink water . Continue with her walking  . 01/10/19 . Patient is checking her BP as advised per her PCP . Reviewed her medications . Her highest  BP 132/69 and lowest 113/57 . She states that realizes she has been checking her BP at the wrong times ie. Right after moving around a lot , after drinking a couple cups of coffee, or smoking. Discussed more about when to check her BP. [name] Reviewed appointment information.  Patient has an appointment with Dr. [name] 02/06/19. We decided to move our appointment out to 02/13/19. . 04/04/19 . Patient states that she has not checked her BP since she has been in the office.  I asked her will she check and have value for me the next time I call and she stated that she would.  Please see past updates related to this goal by clicking on the "Past Updates" button in the selected  goal      . I want to stop sticking my fingers three times a day (pt-stated)       GOAL: Understand your diabetes (DM) diagnosis and how to manage it  Diabetes (DM) Self-Health Management Activities:   Understand  your diabetes (DM) diagnosis and symptoms  Diabetes (DM) Definition: A condition in which there is a high level of sugar (glucose) in the blood. This may be caused by several factors.   Some Diabetes signs/symptoms:  . Increased urination . Excessive thirst and hunger . Blurred vision and weakness . Tingling in hands or feet . Sores with possible infections that heal slowly or do not heal   Things you can do: [name] Control your blood sugar, cholesterol and blood pressure through healthy eating habits and an active lifestyle . Maintain an ideal body weight . Continue to check blood sugar - blood sugars range 89-110    2. Know which medicines are used to manage your diabetes (DM) and take them every day   Name of Medication When I take this medication  Metformin 500 mg  Two times a day              4. Record and keep your provider appointments   We discussed the signs and symptoms of hypoglycemia and I am glad you understand and recognize the feeling when your sugar gets low. (only had 2 episodes since she has been a diabetic) not recent Patient understand to take 6 oz of juice or 6 pieces of hard candy or glucose tablet and 15 minutes later re check your blood sugar.  Reinforced instructions regarding daily blood glucose monitoring. Reviewed medications, nutrition and treatment recommendations. Confirmed pending provider visits 12/18/18 with Dr. [name]  01/10/19 Patient is checking her blood sugars and recording daily Taking medications as prescibed A1c 12/18/18 5.8 Congratulated her on her efforts   02/16/19 Checking and recording blood sugars daily Taking medications  Range 89-110  03/16/19 Checking blood sugars 3 times daily Blood sugar this morning  110  04/04/19 ~Patient states at her last visit in the office with her blood sugars staying at 110 and a1c aaround 6.0 she was [name] drug holiday fro 2 weeks with contiuous monitoring of her blood sugars with diet and exercise.   ~She states that her blood sugar this morning was 109 and she had just eaten andd will check her blood sugar in two hours. ~  she will have her blood sugar re checked in the office when she comes back in for a pap in 2 weeks to see how she has been manageing. Advised her to follow the plan given her by her PCP.     Please see past updates related to this goal by  clicking on the "Past Updates" button in the selected goal            Ms. Weilbacher was given information about Care Management services today including:  1. Care Management services include personalized support from designated clinical staff supervised by her physician, including individualized plan of care and coordination with other care providers 2. 24/7 contact phone numbers for assistance for urgent and routine care needs. 3. The patient may stop CCM services at any time (effective at the end of the month) by phone call to the office staff.  Patient agreed to services and verbal consent obtained.   The patient verbalized understanding of instructions provided today and declined a print copy of patient instruction materials.   The care management team will reach out to the patient again over the next 21 days.  The patient has been provided with contact information for the care management team and has been advised to call with any health related questions or concerns.   Lazaro Arms RN, BSN, [facility] Care Management Coordinator [place] Phone: [phone] Fax: [phone]

## 2019-04-24 ENCOUNTER — Other Ambulatory Visit: Payer: Self-pay

## 2019-04-24 ENCOUNTER — Ambulatory Visit (INDEPENDENT_AMBULATORY_CARE_PROVIDER_SITE_OTHER): Payer: Medicare Other | Admitting: Family Medicine

## 2019-04-24 ENCOUNTER — Encounter: Payer: Self-pay | Admitting: Family Medicine

## 2019-04-24 ENCOUNTER — Other Ambulatory Visit (HOSPITAL_COMMUNITY)
Admission: RE | Admit: 2019-04-24 | Discharge: 2019-04-24 | Disposition: A | Discharge status: Home / Self Care | Payer: Medicare Other | Source: Ambulatory Visit | Attending: Family Medicine | Admitting: Family Medicine

## 2019-04-24 VITALS — BP 130/60 | HR 73 | Ht 60.0 in | Wt 148.0 lb

## 2019-04-24 DIAGNOSIS — Z124 Encounter for screening for malignant neoplasm of cervix: Secondary | ICD-10-CM | POA: Insufficient documentation

## 2019-04-24 DIAGNOSIS — Z780 Asymptomatic menopausal state: Secondary | ICD-10-CM | POA: Insufficient documentation

## 2019-04-24 DIAGNOSIS — G8929 Other chronic pain: Secondary | ICD-10-CM | POA: Diagnosis not present

## 2019-04-24 DIAGNOSIS — E1169 Type 2 diabetes mellitus with other specified complication: Secondary | ICD-10-CM

## 2019-04-24 DIAGNOSIS — M549 Dorsalgia, unspecified: Secondary | ICD-10-CM | POA: Diagnosis not present

## 2019-04-24 DIAGNOSIS — Z1151 Encounter for screening for human papillomavirus (HPV): Secondary | ICD-10-CM | POA: Diagnosis not present

## 2019-04-24 DIAGNOSIS — J449 Chronic obstructive pulmonary disease, unspecified: Secondary | ICD-10-CM | POA: Diagnosis not present

## 2019-04-24 LAB — GLUCOSE, POCT (MANUAL RESULT ENTRY): POC Glucose: 95 mg/dl (ref 70–99)

## 2019-04-24 MED ORDER — ALBUTEROL SULFATE HFA 108 (90 BASE) MCG/ACT IN AERS: 2.0000 | INHALATION_SPRAY | Freq: Four times a day (QID) | RESPIRATORY_TRACT | 7 refills | Status: DC | PRN

## 2019-04-24 NOTE — Patient Instructions (Signed)

## 2019-04-24 NOTE — Progress Notes (Signed)
    SUBJECTIVE:   CHIEF COMPLAINT / HPI:   Gyn exam: Here for PAP. She denies vaginal bleeding or discharge. Not sexually active. No concerns.  DM2: Off Metformin x 2 weeks now. Home CBG runs in the normal range. Here for f/u. She is maintaining her weight with no rapid weight loss.  Right hip/Back pain: Back pain persists but not worsened. She had an episode of right hip pain three times since her last visit to me. Her pain is at her baseline today.  COPD: Albuterol expired. Need refill.  PERTINENT  PMH / PSH: PMX reviewed  OBJECTIVE:   BP 130/60   Pulse 73   Ht 5' (1.524 m)   Wt 148 lb (67.1 kg)   LMP 12/27/2011   SpO2 95%   BMI 28.90 kg/m    Physical Exam Vitals reviewed. Exam conducted with a chaperone present (Jazmin Hartsell).  Cardiovascular:     Rate and Rhythm: Normal rate.     Heart sounds: Normal heart sounds. No murmur.  Pulmonary:     Effort: Pulmonary effort is normal. No respiratory distress.     Breath sounds: Normal breath sounds. No wheezing or rhonchi.  Abdominal:     General: Bowel sounds are normal.     Palpations: Abdomen is soft. There is no mass.     Tenderness: There is no abdominal tenderness.  Genitourinary:    Exam position: Lithotomy position.     Labia:        Right: No rash or tenderness.        Left: No rash or tenderness.      Vagina: Normal.     Cervix: Normal.     Uterus: Normal.      Adnexa: Right adnexa normal.  Musculoskeletal:     Lumbar back: Normal.     Right hip: Normal.     Left hip: Normal.     Right lower leg: No edema.     Left lower leg: No edema.  Neurological:     Mental Status: She is oriented to person, place, and time.      ASSESSMENT/PLAN:  Gyne exam: PAP completed. I will contact her soon with the result.   Diabetes mellitus CBG checked in the clinic during visit: 93 Continue to hold off Metformin. A1C check in 2-3 months.  Chronic back pain Still refusing imaging for her back and hip  pain. NSAID as needed for now. Return soon if pain worsens.     Andrena Mews, MD [place]

## 2019-04-24 NOTE — Assessment & Plan Note (Signed)
No acute flare. I refilled her inhaler.

## 2019-04-24 NOTE — Assessment & Plan Note (Signed)
CBG checked in the clinic during visit: 93 Continue to hold off Metformin. A1C check in 2-3 months.

## 2019-04-24 NOTE — Assessment & Plan Note (Signed)
Still refusing imaging for her back and hip pain. NSAID as needed for now. Return soon if pain worsens.

## 2019-04-25 ENCOUNTER — Ambulatory Visit: Payer: Medicare Other

## 2019-04-25 LAB — CYTOLOGY - PAP
Comment: NEGATIVE
Diagnosis: NEGATIVE
High risk HPV: NEGATIVE

## 2019-04-25 NOTE — Chronic Care Management (AMB) (Signed)
  Care Management   Outreach Note  04/25/2019 Name: Betty Chapman MRN: [id number] DOB: [birth year]  Referred by: [name], MD Reason for referral : Care Coordination (Care Management RNCM HTN/DM II)   An unsuccessful telephone outreach was attempted today. The patient was referred to the case management team for assistance with care management and care coordination.   Follow Up Plan: A HIPPA compliant phone message was left for the patient providing contact information and requesting a return call.  The care management team will reach out to the patient again over the next 7-14 days.   Lazaro Arms RN, BSN, [facility] Care Management Coordinator [place] Phone: [phone] Fax: [phone]

## 2019-04-26 ENCOUNTER — Other Ambulatory Visit: Payer: Self-pay | Admitting: Family Medicine

## 2019-04-26 ENCOUNTER — Telehealth: Payer: Self-pay | Admitting: *Deleted

## 2019-04-26 MED ORDER — ALBUTEROL SULFATE HFA 108 (90 BASE) MCG/ACT IN AERS: 2.0000 | INHALATION_SPRAY | Freq: Four times a day (QID) | RESPIRATORY_TRACT | 5 refills | Status: DC | PRN

## 2019-04-26 NOTE — Telephone Encounter (Signed)
Pharmacy states that ventolin isn't covered until this patient's insurance.  Please send in another albuterol option.  Siddhartha Hoback,CMA

## 2019-04-26 NOTE — Telephone Encounter (Signed)
Pro-air sent in. It still appears as ventolin. I spoke with pharmacist and they recommend that I document type on the script.

## 2019-05-03 ENCOUNTER — Ambulatory Visit: Payer: Medicare Other

## 2019-05-03 ENCOUNTER — Ambulatory Visit: Payer: Self-pay

## 2019-05-03 ENCOUNTER — Other Ambulatory Visit: Payer: Self-pay

## 2019-05-03 NOTE — Chronic Care Management (AMB) (Signed)
  Care Management   Outreach Note  05/03/2019 Name: BYANCA YOUNGBLOOD MRN: [id number] DOB: 1960-09-12  Referred by: [name], MD Reason for referral : Care Coordination (Care Management RNCM F/u)   An unsuccessful telephone outreach was attempted today. The patient was referred to the case management team for assistance with care management and care coordination.   Follow Up Plan: A HIPPA compliant phone message was left for the patient providing contact information and requesting a return call.  The care management team will reach out to the patient again over the next 7-14 days.   Lazaro Arms RN, BSN, [facility] Care Management Coordinator [place] Phone: [phone] Fax: [phone]

## 2019-05-14 NOTE — Patient Instructions (Signed)
Visit Information  Goals Addressed            This Visit's Progress   . "Since I started taking my bp medication I no longer check my blood pressures" (pt-stated)       Current Barriers:  [name] Knowledge Deficits related to self management of of HTN as evidenced by not checking BP at regular intervals.  Nurse Case Manager Clinical Goal(s):  [name] Over the next 90 days, patient will demonstrate improved health management independence as evidenced by the patient  checking and recording her blood pressures.   Patient Self Care Activities:  . Attends all scheduled provider appointments . Calls provider office for new concerns or questions . Does not check her Blood pressures regularly and  record values  . Interventions:  . Provided education to patient re: sodium in diet . Her pressures range between 130/70 to 130/77 . She is taking her medication as prescribed . The patient has been checking her blood pressure 1 to 2 times a week and recording valuess to recognizes any abnormal patterns . Drink water . Continue with her walking  . 01/10/19 . Patient is checking her BP as advised per her PCP . Reviewed her medications . [name] 132/69 and lowest 113/57 . She states that realizes she has been checking her BP at the wrong times ie. Right after moving around a lot , after drinking a couple cups of coffee, or smoking. Discussed more about when to check her BP. [name] Reviewed appointment information.  Patient has an appointment with Dr. [name] 02/06/19. We decided to move our appointment out to 02/13/19. . 04/04/19 . Patient states that she has not checked her BP since she has been in the office.  I asked her will she check and have value for me the next time I call and she stated that she would. . 05/03/19 . Every once in a while she is checking her blood pressure  . Monitor her diet . Taking  her medications as prescribed . Last BP was 130/60 . Weight 148  Please see past updates related to  this goal by clicking on the "Past Updates" button in the selected goal      . I want to stop sticking my fingers three times a day (pt-stated)       GOAL: Understand your diabetes (DM) diagnosis and how to manage it  Diabetes (DM) Self-Health Management Activities:   Understand  your diabetes (DM) diagnosis and symptoms  Diabetes (DM) Definition: A condition in which there is a high level of sugar (glucose) in the blood. This may be caused by several factors.   Some Diabetes signs/symptoms:  . Increased urination . Excessive thirst and hunger . Blurred vision and weakness . Tingling in hands or feet . Sores with possible infections that heal slowly or do not heal   Things you can do: [name] Control your blood sugar, cholesterol and blood pressure through healthy eating habits and an active lifestyle . Maintain an ideal body weight . Continue to check blood sugar - blood sugars range 89-110    2. Know which medicines are used to manage your diabetes (DM) and take them every day   Name of Medication When I take this medication  Metformin 500 mg  Two times a day medication on hold              4. Record and keep your provider appointments   We discussed the signs and symptoms of  hypoglycemia and I am glad you understand and recognize the feeling when your sugar gets low. (only had 2 episodes since she has been a diabetic) not recent Patient understand to take 6 oz of juice or 6 pieces of hard candy or glucose tablet and 15 minutes later re check your blood sugar.  Reinforced instructions regarding daily blood glucose monitoring. Reviewed medications, nutrition and treatment recommendations. Confirmed pending provider visits 12/18/18 with Dr. [name]  01/10/19 Patient is checking her blood sugars and recording daily Taking medications as prescibed A1c 12/18/18 5.8 Congratulated her on her efforts   02/16/19 Checking and recording blood sugars daily Taking medications  Range  89-110  03/16/19 Checking blood sugars 3 times daily Blood sugar this morning 110  04/04/19 ~Patient states at her last visit in the office with her blood sugars staying at 110 and a1c aaround 6.0 she was [name] drug holiday fro 2 weeks with contiuous monitoring of her blood sugars with diet and exercise.   ~She states that her blood sugar this morning was 109 and she had just eaten andd will check her blood sugar in two hours. ~  she will have her blood sugar re checked in the office when she comes back in for a pap in 2 weeks to see how she has been manageing. Advised her to follow the plan given her by her PCP.  05/03/19 `She is checking  her blood sugar daily bid BS was 93 ` patient is still on hold with metformin until she checks her A!C     Please see past updates related to this goal by clicking on the "Past Updates" button in the selected goal            Ms. Gerrity was given information about Care Management services today including:  1. Care Management services include personalized support from designated clinical staff supervised by her physician, including individualized plan of care and coordination with other care providers 2. 24/7 contact phone numbers for assistance for urgent and routine care needs. 3. The patient may stop CCM services at any time (effective at the end of the month) by phone call to the office staff.  Patient agreed to services and verbal consent obtained.   The patient verbalized understanding of instructions provided today and declined a print copy of patient instruction materials.   The care management team will reach out to the patient again over the next 21 days.  The patient has been provided with contact information for the care management team and has been advised to call with any health related questions or concerns.   Lazaro Arms RN, BSN, [facility] Care Management Coordinator [place] Phone: [phone] Fax:  [phone]

## 2019-05-14 NOTE — Chronic Care Management (AMB) (Signed)
Care Management   Follow Up Note   05/14/2019 Name: Betty Chapman MRN: [id number] DOB: [birth year]  Referred by: [name], MD Reason for referral : Care Coordination (Care Management RNCM F/U)   Betty Chapman is a 59 y.o. year old female who is a primary care patient of [name], MD. The care management team was consulted for assistance with care management and care coordination needs.    Review of patient status, including review of consultants reports, relevant laboratory and other test results, and collaboration with appropriate care team members and the patient's provider was performed as part of comprehensive patient evaluation and provision of chronic care management services.    SDOH (Social Determinants of Health) assessments performed: No See Care Plan activities for detailed interventions related to [facility])     Advanced Directives: See Care Plan and Vynca application for related entries.   Goals Addressed            This Visit's Progress   . "Since I started taking my bp medication I no longer check my blood pressures" (pt-stated)       Current Barriers:  [name] Knowledge Deficits related to self management of of HTN as evidenced by not checking BP at regular intervals.  Nurse Case Manager Clinical Goal(s):  [name] Over the next 90 days, patient will demonstrate improved health management independence as evidenced by the patient  checking and recording her blood pressures.   Patient Self Care Activities:  . Attends all scheduled provider appointments . Calls provider office for new concerns or questions . Does not check her Blood pressures regularly and  record values  . Interventions:  . Provided education to patient re: sodium in diet . Her pressures range between 130/70 to 130/77 . She is taking her medication as prescribed . The patient has been checking her blood pressure 1 to 2 times a week and recording valuess to recognizes any abnormal  patterns . Drink water . Continue with her walking  . 01/10/19 . Patient is checking her BP as advised per her PCP . Reviewed her medications . [name] 132/69 and lowest 113/57 . She states that realizes she has been checking her BP at the wrong times ie. Right after moving around a lot , after drinking a couple cups of coffee, or smoking. Discussed more about when to check her BP. [name] Reviewed appointment information.  Patient has an appointment with Dr. [name] 02/06/19. We decided to move our appointment out to 02/13/19. . 04/04/19 . Patient states that she has not checked her BP since she has been in the office.  I asked her will she check and have value for me the next time I call and she stated that she would. . 05/03/19 . Every once in a while she is checking her blood pressure  . Monitor her diet . Taking  her medications as prescribed . Last BP was 130/60 . Weight 148  Please see past updates related to this goal by clicking on the "Past Updates" button in the selected goal      . I want to stop sticking my fingers three times a day (pt-stated)       GOAL: Understand your diabetes (DM) diagnosis and how to manage it  Diabetes (DM) Self-Health Management Activities:   Understand  your diabetes (DM) diagnosis and symptoms  Diabetes (DM) Definition: A condition in which there is a high level of sugar (glucose) in the blood. This may  be caused by several factors.   Some Diabetes signs/symptoms:  . Increased urination . Excessive thirst and hunger . Blurred vision and weakness . Tingling in hands or feet . Sores with possible infections that heal slowly or do not heal   Things you can do: [name] Control your blood sugar, cholesterol and blood pressure through healthy eating habits and an active lifestyle . Maintain an ideal body weight . Continue to check blood sugar - blood sugars range 89-110    2. Know which medicines are used to manage your diabetes (DM) and take them every  day   Name of Medication When I take this medication  Metformin 500 mg  Two times a day medication on hold              4. Record and keep your provider appointments   We discussed the signs and symptoms of hypoglycemia and I am glad you understand and recognize the feeling when your sugar gets low. (only had 2 episodes since she has been a diabetic) not recent Patient understand to take 6 oz of juice or 6 pieces of hard candy or glucose tablet and 15 minutes later re check your blood sugar.  Reinforced instructions regarding daily blood glucose monitoring. Reviewed medications, nutrition and treatment recommendations. Confirmed pending provider visits 12/18/18 with Dr. [name]  01/10/19 Patient is checking her blood sugars and recording daily Taking medications as prescibed A1c 12/18/18 5.8 Congratulated her on her efforts   02/16/19 Checking and recording blood sugars daily Taking medications  Range 89-110  03/16/19 Checking blood sugars 3 times daily Blood sugar this morning 110  04/04/19 ~Patient states at her last visit in the office with her blood sugars staying at 110 and a1c aaround 6.0 she was [name] drug holiday fro 2 weeks with contiuous monitoring of her blood sugars with diet and exercise.   ~She states that her blood sugar this morning was 109 and she had just eaten andd will check her blood sugar in two hours. ~  she will have her blood sugar re checked in the office when she comes back in for a pap in 2 weeks to see how she has been manageing. Advised her to follow the plan given her by her PCP.  05/03/19 `She is checking  her blood sugar daily bid BS was 93 ` patient is still on hold with metformin until she checks her A!C     Please see past updates related to this goal by clicking on the "Past Updates" button in the selected goal             The care management team will reach out to the patient again over the next 21 days.  The patient has been  provided with contact information for the care management team and has been advised to call with any health related questions or concerns.   Lazaro Arms RN, BSN, [facility] Care Management Coordinator [place] Phone: [phone] Fax: [phone]

## 2019-05-24 ENCOUNTER — Other Ambulatory Visit: Payer: Self-pay

## 2019-05-24 ENCOUNTER — Telehealth: Payer: Medicare Other

## 2019-05-28 ENCOUNTER — Encounter: Payer: Self-pay | Admitting: Internal Medicine

## 2019-05-31 ENCOUNTER — Telehealth: Payer: Self-pay

## 2019-05-31 ENCOUNTER — Other Ambulatory Visit: Payer: Self-pay | Admitting: Family Medicine

## 2019-05-31 DIAGNOSIS — Z1211 Encounter for screening for malignant neoplasm of colon: Secondary | ICD-10-CM

## 2019-05-31 NOTE — Telephone Encounter (Signed)
Please advise her that her handicap form is completed and ready for pick up.  Also, advise her that she might not be due for colonoscopy per her recent one done in 2016, although, not well specified.  I went ahead and referred her to GI. They will let her know if she need to get colonoscopy done now or wait till 2026, since her last one was done in 2016.

## 2019-05-31 NOTE — Telephone Encounter (Signed)
Patient calls nurse line for (2) reasons. Patient states she now has insurance and its time again for her colonoscopy. However, patient does not want to go where she went last time, "all the way in [facility]," she would like to be referred somewhere locally in [place]. (2) Patient would like for another handicap placard to be filled out. Patient reports the last one that was done was only good for 6 months. Patient is requesting something permanent, because you have to pay each time one is renewed. Form in providers box for completion.

## 2019-06-01 NOTE — Telephone Encounter (Signed)
Patient aware and will be by to pick up form.  Shenae Bonanno,CMA

## 2019-06-07 ENCOUNTER — Other Ambulatory Visit: Payer: Self-pay

## 2019-06-07 ENCOUNTER — Ambulatory Visit: Payer: Medicare Other

## 2019-06-07 NOTE — Chronic Care Management (AMB) (Signed)
Care Management   Follow Up Note   06/07/2019 Name: NAUTIA LERNER MRN: [id number] DOB: [birth year]  Referred by: [name], MD Reason for referral : Care Coordination (Care Mangement RNCM  DM II/HTN)   KATIYA CROMPTON is a 59 y.o. year old female who is a primary care patient of [name], MD. The care management team was consulted for assistance with care management and care coordination needs.    Review of patient status, including review of consultants reports, relevant laboratory and other test results, and collaboration with appropriate care team members and the patient's provider was performed as part of comprehensive patient evaluation and provision of chronic care management services.    SDOH (Social Determinants of Health) assessments performed: No See Care Plan activities for detailed interventions related to [facility])     Advanced Directives: See Care Plan and Vynca application for related entries.   Goals Addressed            This Visit's Progress   . "I want to lose weight so I can stop taking my bp medication" (pt-stated)       Current Barriers:  [name] Knowledge Deficits related to self management of weight loss  Nurse Case Manager Clinical Goal(s):  [name] Over the next 30 days, patient will work with RN case manger to address needs related to weight loss  Interventions:  . Advised patient to to eat six small meals per day to increase her metabolism. . Patient will start to weigh once a month . She is drinking more water . She is trying to monitor her food intake  . She is walking  06/07/19 . Patient stated that she did check her BP this week but did not remember her BP reading at the time, but it was good . She states that she is having pain in her back the pain is a 8/10 . Nolen Mu is taking her muscle relaxers and pain meds as needed but tries not to take them to much. . Advised the patient to also use cold and heat methods as well . The patient  stated that she is going to call and set up an appointment with Dr [name] see about her back and wants to get another x-ray.      Patient Self Care Activities:  . Self administers medications as prescribed . Attends all scheduled provider appointments . Calls pharmacy for medication refills . Performs ADL's independently . Performs IADL's independently . Calls provider office for new concerns or questions . Unable to independently self manage weight loss  Please see past updates related to this goal by clicking on the "Past Updates" button in the selected goal      . I want to stop sticking my fingers three times a day (pt-stated)       GOAL: Understand your diabetes (DM) diagnosis and how to manage it  Diabetes (DM) Self-Health Management Activities:   Understand  your diabetes (DM) diagnosis and symptoms  Diabetes (DM) Definition: A condition in which there is a high level of sugar (glucose) in the blood. This may be caused by several factors.   Some Diabetes signs/symptoms:  . Increased urination . Excessive thirst and hunger . Blurred vision and weakness . Tingling in hands or feet . Sores with possible infections that heal slowly or do not heal   Things you can do: [name] Control your blood sugar, cholesterol and blood pressure through healthy eating habits and an active lifestyle . Maintain  an ideal body weight . Continue to check blood sugar - blood sugars range 89-110    2. Know which medicines are used to manage your diabetes (DM) and take them every day   Name of Medication When I take this medication  Metformin 500 mg  Two times a day medication on hold              4. Record and keep your provider appointments   We discussed the signs and symptoms of hypoglycemia and I am glad you understand and recognize the feeling when your sugar gets low. (only had 2 episodes since she has been a diabetic) not recent Patient understand to take 6 oz of juice or 6 pieces  of hard candy or glucose tablet and 15 minutes later re check your blood sugar.  Reinforced instructions regarding daily blood glucose monitoring. Reviewed medications, nutrition and treatment recommendations. Confirmed pending provider visits 12/18/18 with Dr. [name]  06/07/19 . Patient states that she continues to check her blood sugars daily  . Her blood sugar this morning was 112 . She continues to monitor her food and exercise      Please see past updates related to this goal by clicking on the "Past Updates" button in the selected goal             RNCM will follow up with the patient within the next  month of July  Aaidyn San RN, BSN, [place] Phone: [phone] Fax: [phone]

## 2019-06-07 NOTE — Patient Instructions (Signed)
Visit Information  Goals Addressed            This Visit's Progress   . "I want to lose weight so I can stop taking my bp medication" (pt-stated)       Current Barriers:  [name] Knowledge Deficits related to self management of weight loss  Nurse Case Manager Clinical Goal(s):  [name] Over the next 30 days, patient will work with RN case manger to address needs related to weight loss  Interventions:  . Advised patient to to eat six small meals per day to increase her metabolism. . Patient will start to weigh once a month . She is drinking more water . She is trying to monitor her food intake  . She is walking  06/07/19 . Patient stated that she did check her BP this week but did not remember her BP reading at the time, but it was good . She states that she is having pain in her back the pain is a 8/10 . Nolen Mu is taking her muscle relaxers and pain meds as needed but tries not to take them to much. . Advised the patient to also use cold and heat methods as well . The patient stated that she is going to call and set up an appointment with Dr [name] see about her back and wants to get another x-ray.      Patient Self Care Activities:  . Self administers medications as prescribed . Attends all scheduled provider appointments . Calls pharmacy for medication refills . Performs ADL's independently . Performs IADL's independently . Calls provider office for new concerns or questions . Unable to independently self manage weight loss  Please see past updates related to this goal by clicking on the "Past Updates" button in the selected goal      . I want to stop sticking my fingers three times a day (pt-stated)       GOAL: Understand your diabetes (DM) diagnosis and how to manage it  Diabetes (DM) Self-Health Management Activities:   Understand  your diabetes (DM) diagnosis and symptoms  Diabetes (DM) Definition: A condition in which there is a high level of sugar (glucose) in the blood.  This may be caused by several factors.   Some Diabetes signs/symptoms:  . Increased urination . Excessive thirst and hunger . Blurred vision and weakness . Tingling in hands or feet . Sores with possible infections that heal slowly or do not heal   Things you can do: [name] Control your blood sugar, cholesterol and blood pressure through healthy eating habits and an active lifestyle . Maintain an ideal body weight . Continue to check blood sugar - blood sugars range 89-110    2. Know which medicines are used to manage your diabetes (DM) and take them every day   Name of Medication When I take this medication  Metformin 500 mg  Two times a day medication on hold              4. Record and keep your provider appointments   We discussed the signs and symptoms of hypoglycemia and I am glad you understand and recognize the feeling when your sugar gets low. (only had 2 episodes since she has been a diabetic) not recent Patient understand to take 6 oz of juice or 6 pieces of hard candy or glucose tablet and 15 minutes later re check your blood sugar.  Reinforced instructions regarding daily blood glucose monitoring. Reviewed medications, nutrition and treatment recommendations. Confirmed  pending provider visits 12/18/18 with Dr. [name]  06/07/19 . Patient states that she continues to check her blood sugars daily  . Her blood sugar this morning was 112 . She continues to monitor her food and exercise      Please see past updates related to this goal by clicking on the "Past Updates" button in the selected goal            Ms. Bulley was given information about Care Management services today including:  1. Care Management services include personalized support from designated clinical staff supervised by her physician, including individualized plan of care and coordination with other care providers 2. 24/7 contact phone numbers for assistance for urgent and routine care  needs. 3. The patient may stop CCM services at any time (effective at the end of the month) by phone call to the office staff.  Patient agreed to services and verbal consent obtained.   The patient verbalized understanding of instructions provided today and declined a print copy of patient instruction materials.   RNCM will follow up with the patient within the next month of July Clare Fennimore RN, BSN, [facility] Care Management Coordinator [place] Phone: [phone] Fax: [phone]

## 2019-06-15 ENCOUNTER — Telehealth: Payer: Self-pay

## 2019-06-15 ENCOUNTER — Other Ambulatory Visit: Payer: Self-pay | Admitting: Family Medicine

## 2019-06-15 DIAGNOSIS — M47816 Spondylosis without myelopathy or radiculopathy, lumbar region: Secondary | ICD-10-CM

## 2019-06-15 DIAGNOSIS — M25551 Pain in right hip: Secondary | ICD-10-CM

## 2019-06-15 NOTE — Telephone Encounter (Signed)
I gave her a f/u call. Low back and both hips hurt. Worse on the right. Want to proceed with xray. Continue Tylenol/Ibuprofen prn for now.  I will contact her with xray report and further treatment recommendations.

## 2019-06-15 NOTE — Telephone Encounter (Signed)
Please advise her that I will place xray order later today. I am in the clinic seeing patients now. He can go to [employer] anytime from 2 PM today to get her xray done.

## 2019-06-15 NOTE — Telephone Encounter (Signed)
Patient calls nurse line to report worsening back and hip pain. Patient states that she has discussed with Dr. [name] getting an X-ray if pain worsened.   To PCP  Please advise  Talbot Grumbling, RN

## 2019-06-16 ENCOUNTER — Ambulatory Visit (HOSPITAL_COMMUNITY)
Admit: 2019-06-16 | Discharge: 2019-06-16 | Disposition: A | Discharge status: Home / Self Care | Payer: Medicare Other | Attending: Family Medicine | Admitting: Family Medicine

## 2019-06-16 ENCOUNTER — Ambulatory Visit (HOSPITAL_COMMUNITY)
Admission: RE | Admit: 2019-06-16 | Discharge: 2019-06-16 | Disposition: A | Discharge status: Home / Self Care | Payer: Medicare Other | Source: Ambulatory Visit | Attending: Family Medicine | Admitting: Family Medicine

## 2019-06-16 ENCOUNTER — Emergency Department (HOSPITAL_COMMUNITY)
Admission: EM | Admit: 2019-06-16 | Discharge status: Absent without leave | Payer: Medicare Other | Source: Home / Self Care

## 2019-06-16 DIAGNOSIS — M25551 Pain in right hip: Secondary | ICD-10-CM

## 2019-06-16 DIAGNOSIS — M25559 Pain in unspecified hip: Secondary | ICD-10-CM | POA: Insufficient documentation

## 2019-06-16 DIAGNOSIS — M545 Low back pain: Secondary | ICD-10-CM | POA: Diagnosis not present

## 2019-06-16 DIAGNOSIS — G8929 Other chronic pain: Secondary | ICD-10-CM | POA: Diagnosis not present

## 2019-06-16 DIAGNOSIS — M25552 Pain in left hip: Secondary | ICD-10-CM | POA: Insufficient documentation

## 2019-06-16 DIAGNOSIS — M47816 Spondylosis without myelopathy or radiculopathy, lumbar region: Secondary | ICD-10-CM | POA: Diagnosis not present

## 2019-06-18 ENCOUNTER — Telehealth: Payer: Self-pay | Admitting: Family Medicine

## 2019-06-18 DIAGNOSIS — M160 Bilateral primary osteoarthritis of hip: Secondary | ICD-10-CM

## 2019-06-18 DIAGNOSIS — M47816 Spondylosis without myelopathy or radiculopathy, lumbar region: Secondary | ICD-10-CM

## 2019-06-18 MED ORDER — NAPROXEN 500 MG PO TABS: 500.0000 mg | ORAL_TABLET | Freq: Two times a day (BID) | ORAL | 1 refills | Status: DC | PRN

## 2019-06-18 NOTE — Telephone Encounter (Signed)
I called and discussed xray report with her.  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  Start Naproxen daily x 1-2 weeks, then PRN recommended as well as PT referral.  She agreed with the plan.  F/U as needed.

## 2019-07-05 ENCOUNTER — Other Ambulatory Visit: Payer: Self-pay

## 2019-07-05 ENCOUNTER — Ambulatory Visit: Payer: Medicare Other | Admitting: Physical Therapy

## 2019-07-12 ENCOUNTER — Ambulatory Visit: Payer: Medicare Other

## 2019-07-12 ENCOUNTER — Other Ambulatory Visit: Payer: Self-pay

## 2019-07-12 NOTE — Patient Instructions (Signed)
Visit Information  Goals Addressed              This Visit's Progress   .  "I want to lose weight so I can stop taking my bp medication" (pt-stated)        Current Barriers:  [name] Knowledge Deficits related to self management of weight loss  Nurse Case Manager Clinical Goal(s):  [name] Over the next 30 days, patient will work with RN case manger to address needs related to weight loss  Interventions:  . Advised patient to to eat six small meals per day to increase her metabolism. . Patient will start to weigh once a month . She is drinking more water . She is trying to monitor her food intake  . She is walking  07/12/19 Patient states that she has not lost anymore weight but she has not gained any more.  She has maintained wheat she has.   Patient Self Care Activities:  . Self administers medications as prescribed . Attends all scheduled provider appointments . Calls pharmacy for medication refills . Performs ADL's independently . Performs IADL's independently . Calls provider office for new concerns or questions . Unable to independently self manage weight loss  Please see past updates related to this goal by clicking on the "Past Updates" button in the selected goal      .  "Since I started taking my bp medication I no longer check my blood pressures" (pt-stated)        Current Barriers:  [name] Knowledge Deficits related to self management of of HTN as evidenced by not checking BP at regular intervals.  Nurse Case Manager Clinical Goal(s):  [name] Over the next 90 days, patient will demonstrate improved health management independence as evidenced by the patient  checking and recording her blood pressures.   Patient Self Care Activities:  . Attends all scheduled provider appointments . Calls provider office for new concerns or questions . Does not check her Blood pressures regularly and  record values  . Interventions:  . Provided education to patient re: sodium in diet . Her  pressures range between 130/70 to 130/77 . She is taking her medication as prescribed . The patient has been checking her blood pressure 1 to 2 times a week and recording valuess to recognizes any abnormal patterns . Drink water . Continue with her walking  . 07/12/19 . Patient states that she has  not checked her bp in a month . The last time it was checked the nurse had come to the home and it was  little elevated but she had drank 2 cups of coffee before.  She could not remember the exact numbers. . She denies any ha, chest pain or flushing.  Please see past updates related to this goal by clicking on the "Past Updates" button in the selected goal      .  I want to stop sticking my fingers three times a day (pt-stated)        GOAL: Understand your diabetes (DM) diagnosis and how to manage it  Diabetes (DM) Self-Health Management Activities:   Understand  your diabetes (DM) diagnosis and symptoms  Diabetes (DM) Definition: A condition in which there is a high level of sugar (glucose) in the blood. This may be caused by several factors.   Some Diabetes signs/symptoms:  . Increased urination . Excessive thirst and hunger . Blurred vision and weakness . Tingling in hands or feet . Sores with possible infections that heal  slowly or do not heal   Things you can do: [name] Control your blood sugar, cholesterol and blood pressure through healthy eating habits and an active lifestyle . Maintain an ideal body weight . Continue to check blood sugar - blood sugars range 89-110    2. Know which medicines are used to manage your diabetes (DM) and take them every day   Name of Medication When I take this medication  Metformin 500 mg  Two times a day medication on hold              4. Record and keep your provider appointments   We discussed the signs and symptoms of hypoglycemia and I am glad you understand and recognize the feeling when your sugar gets low. (only had 2 episodes since  she has been a diabetic) not recent Patient understand to take 6 oz of juice or 6 pieces of hard candy or glucose tablet and 15 minutes later re check your blood sugar.  Reinforced instructions regarding daily blood glucose monitoring. Reviewed medications, nutrition and treatment recommendations. Confirmed pending provider visits 12/18/18 with Dr. [name]  07/12/19 . Patient stated that she checks her blood sugars bid.  Her blood sugars are staying below 140 . She is not having any problems . She continues to monitor her food and exercise  . She will schedule to have an appointment to have her a1c rechecked around 6-7 months     Please see past updates related to this goal by clicking on the "Past Updates" button in the selected goal            Ms. Lemire was given information about Care Management services today including:  1. Care Management services include personalized support from designated clinical staff supervised by her physician, including individualized plan of care and coordination with other care providers 2. 24/7 contact phone numbers for assistance for urgent and routine care needs. 3. The patient may stop CCM services at any time (effective at the end of the month) by phone call to the office staff.  Patient agreed to services and verbal consent obtained.   The patient verbalized understanding of instructions provided today and declined a print copy of patient instruction materials.    RNCM will follow up with the patient within the next month of  August Yusuf Yu RN, BSN, [facility] Care Management Coordinator [place] Phone: [phone] Fax: [phone]

## 2019-07-12 NOTE — Chronic Care Management (AMB) (Signed)
Care Management   Follow Up Note   07/12/2019 Name: Betty Chapman MRN: [id number] DOB: [birth year]  Referred by: [name], MD Reason for referral : Care Coordination (Care Management RNCM DM HTN)   Betty Chapman is a 59 y.o. year old female who is a primary care patient of [name], MD. The care management team was consulted for assistance with care management and care coordination needs.    Review of patient status, including review of consultants reports, relevant laboratory and other test results, and collaboration with appropriate care team members and the patient's provider was performed as part of comprehensive patient evaluation and provision of chronic care management services.    SDOH (Social Determinants of Health) assessments performed: No See Care Plan activities for detailed interventions related to [facility])     Advanced Directives: See Care Plan and Vynca application for related entries.   Goals Addressed              This Visit's Progress   .  "I want to lose weight so I can stop taking my bp medication" (pt-stated)        Current Barriers:  [name] Knowledge Deficits related to self management of weight loss  Nurse Case Manager Clinical Goal(s):  [name] Over the next 30 days, patient will work with RN case manger to address needs related to weight loss  Interventions:  . Advised patient to to eat six small meals per day to increase her metabolism. . Patient will start to weigh once a month . She is drinking more water . She is trying to monitor her food intake  . She is walking  07/12/19 Patient states that she has not lost anymore weight but she has not gained any more.  She has maintained wheat she has.   Patient Self Care Activities:  . Self administers medications as prescribed . Attends all scheduled provider appointments . Calls pharmacy for medication refills . Performs ADL's independently . Performs IADL's independently . Calls  provider office for new concerns or questions . Unable to independently self manage weight loss  Please see past updates related to this goal by clicking on the "Past Updates" button in the selected goal      .  "Since I started taking my bp medication I no longer check my blood pressures" (pt-stated)        Current Barriers:  [name] Knowledge Deficits related to self management of of HTN as evidenced by not checking BP at regular intervals.  Nurse Case Manager Clinical Goal(s):  [name] Over the next 90 days, patient will demonstrate improved health management independence as evidenced by the patient  checking and recording her blood pressures.   Patient Self Care Activities:  . Attends all scheduled provider appointments . Calls provider office for new concerns or questions . Does not check her Blood pressures regularly and  record values  . Interventions:  . Provided education to patient re: sodium in diet . Her pressures range between 130/70 to 130/77 . She is taking her medication as prescribed . The patient has been checking her blood pressure 1 to 2 times a week and recording valuess to recognizes any abnormal patterns . Drink water . Continue with her walking  . 07/12/19 . Patient states that she has  not checked her bp in a month . The last time it was checked the nurse had come to the home and it was  little elevated but she had drank  2 cups of coffee before.  She could not remember the exact numbers. . She denies any ha, chest pain or flushing.  Please see past updates related to this goal by clicking on the "Past Updates" button in the selected goal      .  I want to stop sticking my fingers three times a day (pt-stated)        GOAL: Understand your diabetes (DM) diagnosis and how to manage it  Diabetes (DM) Self-Health Management Activities:   Understand  your diabetes (DM) diagnosis and symptoms  Diabetes (DM) Definition: A condition in which there is a high level of sugar  (glucose) in the blood. This may be caused by several factors.   Some Diabetes signs/symptoms:  . Increased urination . Excessive thirst and hunger . Blurred vision and weakness . Tingling in hands or feet . Sores with possible infections that heal slowly or do not heal   Things you can do: [name] Control your blood sugar, cholesterol and blood pressure through healthy eating habits and an active lifestyle . Maintain an ideal body weight . Continue to check blood sugar - blood sugars range 89-110    2. Know which medicines are used to manage your diabetes (DM) and take them every day   Name of Medication When I take this medication  Metformin 500 mg  Two times a day medication on hold              4. Record and keep your provider appointments   We discussed the signs and symptoms of hypoglycemia and I am glad you understand and recognize the feeling when your sugar gets low. (only had 2 episodes since she has been a diabetic) not recent Patient understand to take 6 oz of juice or 6 pieces of hard candy or glucose tablet and 15 minutes later re check your blood sugar.  Reinforced instructions regarding daily blood glucose monitoring. Reviewed medications, nutrition and treatment recommendations. Confirmed pending provider visits 12/18/18 with Dr. [name]  07/12/19 . Patient stated that she checks her blood sugars bid.  Her blood sugars are staying below 140 . She is not having any problems . She continues to monitor her food and exercise  . She will schedule to have an appointment to have her a1c rechecked around 6-7 months     Please see past updates related to this goal by clicking on the "Past Updates" button in the selected goal          RNCM will follow up with the patient within the next month of  August Marlean Mortell RN, BSN, [place] Phone: [phone] Fax: [phone]

## 2019-08-09 ENCOUNTER — Other Ambulatory Visit: Payer: Self-pay

## 2019-08-09 ENCOUNTER — Telehealth: Payer: Self-pay

## 2019-08-09 ENCOUNTER — Telehealth: Payer: Medicare Other

## 2019-08-09 NOTE — Telephone Encounter (Signed)
  Care Management   Outreach Note  08/09/2019 Name: Betty Chapman MRN: [id number] DOB: [birth year]  Referred by: [name], MD Reason for referral : No chief complaint on file.   An unsuccessful telephone outreach was attempted today. The patient was referred to the case management team for assistance with care management and care coordination.   Follow Up Plan: The care management team will reach out to the patient again over the next 7-14 days.   Lazaro Arms RN, BSN, [facility] Care Management Coordinator [place] Phone: [phone] Fax: [phone]

## 2019-08-23 NOTE — Telephone Encounter (Signed)
Spoke with patient rescheduled call with RN CM for 08/30/2019 at 1130AM.

## 2019-08-30 ENCOUNTER — Ambulatory Visit: Payer: Medicare Other

## 2019-08-30 NOTE — Chronic Care Management (AMB) (Signed)
Care Management   Follow Up Note   08/30/2019 Name: ABBEE Chapman MRN: [id number] DOB: 09-22-1960  Referred by: [name], MD Reason for referral : Chronic Care Management (DM II HTN)   Betty Chapman is a 59 y.o. year old female who is a primary care patient of [name], MD. The care management team was consulted for assistance with care management and care coordination needs.    Review of patient status, including review of consultants reports, relevant laboratory and other test results, and collaboration with appropriate care team members and the patient's provider was performed as part of comprehensive patient evaluation and provision of chronic care management services.    SDOH (Social Determinants of Health) assessments performed: No See Care Plan activities for detailed interventions related to [name]     Advanced Directives: See Care Plan and Vynca application for related entries.   Goals Addressed              This Visit's Progress   .  "Since I started taking my bp medication I no longer check my blood pressures" (pt-stated)        Current Barriers:  [name] Knowledge Deficits related to self management of of HTN as evidenced by not checking BP at regular intervals.  Nurse Case Manager Clinical Goal(s):  [name] Over the next 90 days, patient will demonstrate improved health management independence as evidenced by the patient  checking and recording her blood pressures.   Patient Self Care Activities:  . Attends all scheduled provider appointments . Calls provider office for new concerns or questions . Does not check her Blood pressures regularly and  record values  . Interventions:  . Provided education to patient re: sodium in diet . Her pressures range between 130/70 to 130/77 . She is taking her medication as prescribed . The patient has been checking her blood pressure 1 to 2 times a week and recording valuess to recognizes any abnormal  patterns . Drink water . Continue with her walking  . 08/30/19 . Patient states that She is checking her BP but not daily . Her BP has been running normal except 2 days she said it was 115/46 which she thought was low .  She did not have any symptoms .  She does not know if it had been her monitor but her pressure is back to normal.  She states that she did have 2 days that it was high out of the normal range because she had been under pressure due to family issues.  I congratulated her on being cognizant of things gong on in her life. Being aware what is making her pressure go up or down and checking her pressure. . I advised her to continue to monitor her pressure being aware of her health and to continue the good work that she is doing.     Please see past updates related to this goal by clicking on the "Past Updates" button in the selected goal      .  I want to stop sticking my fingers three times a day (pt-stated)        GOAL: Understand your diabetes (DM) diagnosis and how to manage it  Diabetes (DM) Self-Health Management Activities:   Understand  your diabetes (DM) diagnosis and symptoms  Diabetes (DM) Definition: A condition in which there is a high level of sugar (glucose) in the blood. This may be caused by several factors.   Some Diabetes signs/symptoms:  .  Increased urination . Excessive thirst and hunger . Blurred vision and weakness . Tingling in hands or feet . Sores with possible infections that heal slowly or do not heal   Things you can do: [name] Control your blood sugar, cholesterol and blood pressure through healthy eating habits and an active lifestyle . Maintain an ideal body weight . Continue to check blood sugar - blood sugars range 89-110    2. Know which medicines are used to manage your diabetes (DM) and take them every day   Name of Medication When I take this medication  Metformin 500 mg  Two times a day medication on hold              4.  Record and keep your provider appointments   We discussed the signs and symptoms of hypoglycemia and I am glad you understand and recognize the feeling when your sugar gets low. (only had 2 episodes since she has been a diabetic) not recent Patient understand to take 6 oz of juice or 6 pieces of hard candy or glucose tablet and 15 minutes later re check your blood sugar.  Reinforced instructions regarding daily blood glucose monitoring. Reviewed medications, nutrition and treatment recommendations. Confirmed pending provider visits 12/18/18 with Dr. [name]  08/30/19 . Patient stated that she checks her blood sugars TID  Her blood sugars are staying around 120 . She is not having any problems . She continues to monitor her food and exercise     Please see past updates related to this goal by clicking on the "Past Updates" button in the selected goal             The care management team will reach out to the patient again over the next 30 days.   Lazaro Arms RN, BSN, [facility] Care Management Coordinator [place] Phone: [phone] Fax: [phone]

## 2019-08-30 NOTE — Patient Instructions (Signed)
Visit Information  Goals Addressed              This Visit's Progress   .  "Since I started taking my bp medication I no longer check my blood pressures" (pt-stated)        Current Barriers:  [name] Knowledge Deficits related to self management of of HTN as evidenced by not checking BP at regular intervals.  Nurse Case Manager Clinical Goal(s):  [name] Over the next 90 days, patient will demonstrate improved health management independence as evidenced by the patient  checking and recording her blood pressures.   Patient Self Care Activities:  . Attends all scheduled provider appointments . Calls provider office for new concerns or questions . Does not check her Blood pressures regularly and  record values  . Interventions:  . Provided education to patient re: sodium in diet . Her pressures range between 130/70 to 130/77 . She is taking her medication as prescribed . The patient has been checking her blood pressure 1 to 2 times a week and recording valuess to recognizes any abnormal patterns . Drink water . Continue with her walking  . 08/30/19 . Patient states that She is checking her BP but not daily . Her BP has been running normal except 2 days she said it was 115/46 which she thought was low .  She did not have any symptoms .  She does not know if it had been her monitor but her pressure is back to normal.  She states that she did have 2 days that it was high out of the normal range because she had been under pressure due to family issues.  I congratulated her on being cognizant of things gong on in her life. Being aware what is making her pressure go up or down and checking her pressure. . I advised her to continue to monitor her pressure being aware of her health and to continue the good work that she is doing.     Please see past updates related to this goal by clicking on the "Past Updates" button in the selected goal      .  I want to stop sticking my fingers three times a day  (pt-stated)        GOAL: Understand your diabetes (DM) diagnosis and how to manage it  Diabetes (DM) Self-Health Management Activities:   Understand  your diabetes (DM) diagnosis and symptoms  Diabetes (DM) Definition: A condition in which there is a high level of sugar (glucose) in the blood. This may be caused by several factors.   Some Diabetes signs/symptoms:  . Increased urination . Excessive thirst and hunger . Blurred vision and weakness . Tingling in hands or feet . Sores with possible infections that heal slowly or do not heal   Things you can do: [name] Control your blood sugar, cholesterol and blood pressure through healthy eating habits and an active lifestyle . Maintain an ideal body weight . Continue to check blood sugar - blood sugars range 89-110    2. Know which medicines are used to manage your diabetes (DM) and take them every day   Name of Medication When I take this medication  Metformin 500 mg  Two times a day medication on hold              4. Record and keep your provider appointments   We discussed the signs and symptoms of hypoglycemia and I am glad you understand and recognize the feeling  when your sugar gets low. (only had 2 episodes since she has been a diabetic) not recent Patient understand to take 6 oz of juice or 6 pieces of hard candy or glucose tablet and 15 minutes later re check your blood sugar.  Reinforced instructions regarding daily blood glucose monitoring. Reviewed medications, nutrition and treatment recommendations. Confirmed pending provider visits 12/18/18 with Dr. [name]  08/30/19 . Patient stated that she checks her blood sugars TID  Her blood sugars are staying around 120 . She is not having any problems . She continues to monitor her food and exercise     Please see past updates related to this goal by clicking on the "Past Updates" button in the selected goal            Betty Chapman was given information about Care  Management services today including:  1. Care Management services include personalized support from designated clinical staff supervised by her physician, including individualized plan of care and coordination with other care providers 2. 24/7 contact phone numbers for assistance for urgent and routine care needs. 3. The patient may stop CCM services at any time (effective at the end of the month) by phone call to the office staff.  Patient agreed to services and verbal consent obtained.   The patient verbalized understanding of instructions provided today and declined a print copy of patient instruction materials.   The care management team will reach out to the patient again over the next 30 days.   Lazaro Arms RN, BSN, [facility] Care Management Coordinator [place] Phone: [phone] Fax: [phone]

## 2019-09-15 ENCOUNTER — Ambulatory Visit (HOSPITAL_COMMUNITY)
Admission: EM | Admit: 2019-09-15 | Discharge: 2019-09-15 | Disposition: A | Discharge status: Home / Self Care | Payer: Medicare Other | Attending: Internal Medicine | Admitting: Internal Medicine

## 2019-09-15 ENCOUNTER — Encounter (HOSPITAL_COMMUNITY): Payer: Self-pay

## 2019-09-15 ENCOUNTER — Other Ambulatory Visit: Payer: Self-pay

## 2019-09-15 DIAGNOSIS — B349 Viral infection, unspecified: Secondary | ICD-10-CM | POA: Insufficient documentation

## 2019-09-15 DIAGNOSIS — E7800 Pure hypercholesterolemia, unspecified: Secondary | ICD-10-CM | POA: Insufficient documentation

## 2019-09-15 DIAGNOSIS — E785 Hyperlipidemia, unspecified: Secondary | ICD-10-CM | POA: Insufficient documentation

## 2019-09-15 DIAGNOSIS — U071 COVID-19: Secondary | ICD-10-CM | POA: Diagnosis not present

## 2019-09-15 DIAGNOSIS — I5030 Unspecified diastolic (congestive) heart failure: Secondary | ICD-10-CM | POA: Insufficient documentation

## 2019-09-15 DIAGNOSIS — F17210 Nicotine dependence, cigarettes, uncomplicated: Secondary | ICD-10-CM | POA: Insufficient documentation

## 2019-09-15 DIAGNOSIS — Z79899 Other long term (current) drug therapy: Secondary | ICD-10-CM | POA: Insufficient documentation

## 2019-09-15 DIAGNOSIS — Z7982 Long term (current) use of aspirin: Secondary | ICD-10-CM | POA: Insufficient documentation

## 2019-09-15 DIAGNOSIS — J449 Chronic obstructive pulmonary disease, unspecified: Secondary | ICD-10-CM | POA: Diagnosis not present

## 2019-09-15 DIAGNOSIS — E1369 Other specified diabetes mellitus with other specified complication: Secondary | ICD-10-CM | POA: Diagnosis not present

## 2019-09-15 DIAGNOSIS — Z7901 Long term (current) use of anticoagulants: Secondary | ICD-10-CM | POA: Diagnosis not present

## 2019-09-15 DIAGNOSIS — R52 Pain, unspecified: Secondary | ICD-10-CM | POA: Diagnosis not present

## 2019-09-15 DIAGNOSIS — I110 Hypertensive heart disease with heart failure: Secondary | ICD-10-CM | POA: Diagnosis not present

## 2019-09-15 DIAGNOSIS — R439 Unspecified disturbances of smell and taste: Secondary | ICD-10-CM | POA: Insufficient documentation

## 2019-09-15 DIAGNOSIS — K219 Gastro-esophageal reflux disease without esophagitis: Secondary | ICD-10-CM | POA: Insufficient documentation

## 2019-09-15 DIAGNOSIS — Z8673 Personal history of transient ischemic attack (TIA), and cerebral infarction without residual deficits: Secondary | ICD-10-CM | POA: Diagnosis not present

## 2019-09-15 LAB — SARS CORONAVIRUS 2 (TAT 6-24 HRS): SARS Coronavirus 2: POSITIVE — AB

## 2019-09-15 NOTE — Discharge Instructions (Signed)
Your COVID test is pending.  You should self quarantine until the test result is back.    Take Tylenol as needed for fever or discomfort.  Rest and keep yourself hydrated.    Go to the emergency department if you develop acute worsening symptoms.     

## 2019-09-15 NOTE — ED Provider Notes (Signed)
[facility]    CSN: [id number] Arrival date & time: 09/15/19  1015      History   Chief Complaint Chief Complaint  Patient presents with  . Generalized Body Aches    HPI Betty Chapman is a 59 y.o. female.   Patient presents with body ache, cough, loss of taste x4 to 5 days.  She is a current smoker and has history of COPD.  She states her current cough and shortness of breath are at baseline for her.  She denies fever, chills, vomiting, diarrhea, rash, or other symptoms.  No treatments attempted at home.  Patient requests COVID test.  The history is provided by the patient.    Past Medical History:  Diagnosis Date  . Blurry vision, bilateral 03/20/2014  . COPD (chronic obstructive pulmonary disease) ([place])   . Dysphagia, pharyngoesophageal phase   . Hypercholesterolemia   . Hypertension   . TIA (transient ischemic attack)    Once  . Tobacco abuse     Patient Active Problem List   Diagnosis Date Noted  . Hyperlipidemia associated with type 2 diabetes mellitus ([place]) 02/06/2019  . Unintended weight loss 02/06/2019  . Overweight 12/18/2018  . Allergy to statin medication 06/13/2018  . Diastolic CHF ([place]) 40/98/1191  . Major depression 05/17/2017  . Lipoma of arm 05/17/2017  . GERD (gastroesophageal reflux disease) 11/23/2016  . Diabetes mellitus ([place]) 07/16/2014  . Epidermal cyst of face 07/16/2014  . Callus of foot 07/16/2014  . Schatzki's ring   . COPD (chronic obstructive pulmonary disease) ([place]) 02/08/2012  . Chronic back pain 07/27/2010  . TRANSIENT ISCHEMIC ATTACK 06/04/2008  . Tobacco use disorder 03/01/2008  . Hypertension 01/30/2008    Past Surgical History:  Procedure Laterality Date  . BTL    . CHOLECYSTECTOMY    . COLONOSCOPY N/A 06/10/2014   Procedure: COLONOSCOPY;  Surgeon: Daneil Dolin, MD;  Location: [facility];  Service: Endoscopy;  Laterality: N/A;  1315  . ECTOPIC PREGNANCY SURGERY    . ESOPHAGOGASTRODUODENOSCOPY N/A  06/10/2014   Procedure: ESOPHAGOGASTRODUODENOSCOPY (EGD);  Surgeon: Daneil Dolin, MD;  Location: [facility];  Service: Endoscopy;  Laterality: N/A;  . Venia Minks DILATION N/A 06/10/2014   Procedure: Venia Minks DILATION;  Surgeon: Daneil Dolin, MD;  Location: [facility];  Service: Endoscopy;  Laterality: N/A;  . TUBAL LIGATION      OB History   No obstetric history on file.      Home Medications    Prior to Admission medications   Medication Sig Start Date End Date Taking? Authorizing Provider  ACCU-CHEK AVIVA PLUS test strip TEST UP TO 3 TIMES DAILY AS DIRECTED 11/02/18  Yes Eniola, Kehinde T, MD  Accu-Chek Softclix Lancets lancets TEST UP TO 3 TIMES DAILY AS DIRECTED 11/02/18  Yes Eniola, Kehinde T, MD  albuterol (PROVENTIL) (2.5 MG/3ML) 0.083% nebulizer solution Take 3 mLs (2.5 mg total) by nebulization every 6 (six) hours as needed for wheezing or shortness of breath. 08/06/16  Yes [name], MD  albuterol (VENTOLIN HFA) 108 (90 Base) MCG/ACT inhaler Inhale 2 puffs into the lungs every 6 (six) hours as needed for wheezing or shortness of breath. 04/26/19  Yes [name], MD  aspirin 81 MG tablet Take 1 tablet (81 mg total) by mouth daily. 05/17/17  Yes [name], MD  baclofen (LIORESAL) 10 MG tablet Take 1 tablet (10 mg total) by mouth 2 (two) times daily as needed for muscle spasms. 03/23/19  Yes Andrena Mews T, MD  blood glucose meter kit and supplies KIT Accu-Chek Aviva Plus Use up to TID daily as directed. (FOR ICD-9 250.00, 250.01). 12/29/17  Yes Eniola, Kehinde T, MD  DULERA 200-5 MCG/ACT AERO USE 2 PUFFS BY MOUTH TWO  TIMES DAILY 01/18/19  Yes Andrena Mews T, MD  ezetimibe (ZETIA) 10 MG tablet TAKE 1 TABLET BY MOUTH  DAILY 08/21/18  Yes Andrena Mews T, MD  ibuprofen (ADVIL) 400 MG tablet Take 1 tablet (400 mg total) by mouth every 8 (eight) hours as needed for moderate pain. 03/23/19  Yes Andrena Mews T, MD  lisinopril (ZESTRIL) 20 MG tablet TAKE 1  TABLET BY MOUTH  DAILY 03/21/19  Yes [name], MD  Omega-3 Fatty Acids (FISH OIL) 1000 MG CAPS Take 1 capsule by mouth 2 (two) times daily.   Yes [provider]  SPIRIVA HANDIHALER 18 MCG inhalation capsule INHALE THE CONTENTS OF 1  CAPSULE VIA HANDIHALER  DAILY 01/18/19  Yes [name], MD  Turmeric (QC TUMERIC COMPLEX) 500 MG CAPS Take by mouth.   Yes [provider]  famotidine (PEPCID) 10 MG tablet Take 10 mg by mouth 2 (two) times daily.    [provider]  hydrOXYzine (ATARAX/VISTARIL) 10 MG tablet Take 1 tablet (10 mg total) by mouth 3 (three) times daily as needed for itching. Patient not taking: Reported on 02/06/2019 07/04/18   [name], MD  naproxen (NAPROSYN) 500 MG tablet Take 1 tablet (500 mg total) by mouth 2 (two) times daily as needed. May take medication BID x 1-2 weeks, then as needed. 06/18/19   [name], MD  triamcinolone ointment (KENALOG) 0.5 % Apply 1 application topically 2 (two) times daily. Patient not taking: Reported on 02/06/2019 07/04/18   [name], MD    Family History Family History  Problem Relation Age of Onset  . Aneurysm Mother        brain, cause of death  . Cancer Brother        Dies of cancer, unsure what primary site  . Prostate cancer Brother   . Colon cancer Neg Hx   . Breast cancer Neg Hx     Social History Social History   Tobacco Use  . Smoking status: Current Every Day Smoker    Packs/day: 1.00    Years: 47.00    Pack years: 47.00    Start date: 01/04/1970  . Smokeless tobacco: Never Used  . Tobacco comment: 1 pack per day max   Vaping Use  . Vaping Use: Never used  Substance Use Topics  . Alcohol use: Yes    Alcohol/week: 0.0 standard drinks    Comment: rarely   . Drug use: Yes    Types: Marijuana    Comment: Occasionally     Allergies   Mushroom extract complex, Pravastatin sodium, and Statins   Review of Systems Review of Systems  Constitutional: Negative  for chills and fever.  HENT: Negative for ear pain and sore throat.   Eyes: Negative for pain and visual disturbance.  Respiratory: Positive for cough and shortness of breath.   Cardiovascular: Negative for chest pain and palpitations.  Gastrointestinal: Negative for abdominal pain, diarrhea and vomiting.  Genitourinary: Negative for dysuria and hematuria.  Musculoskeletal: Positive for arthralgias. Negative for back pain.  Skin: Negative for color change and rash.  Neurological: Negative for seizures and syncope.  All other systems reviewed and are negative.    Physical Exam  Triage Vital Signs ED Triage Vitals  Enc Vitals Group     BP 09/15/19 1103 140/63     Pulse Rate 09/15/19 1103 92     Resp 09/15/19 1103 20     Temp 09/15/19 1103 98.7 F (37.1 C)     Temp Source 09/15/19 1103 Oral     SpO2 09/15/19 1103 98 %     Weight --      Height --      Head Circumference --      Peak Flow --      Pain Score 09/15/19 1108 4     Pain Loc --      Pain Edu? --      Excl. in [place]? --    No data found.  Updated Vital Signs BP 140/63 (BP Location: Right Arm)   Pulse 92   Temp 98.7 F (37.1 C) (Oral)   Resp 20   LMP 12/27/2011   SpO2 98%   Visual Acuity Right Eye Distance:   Left Eye Distance:   Bilateral Distance:    Right Eye Near:   Left Eye Near:    Bilateral Near:     Physical Exam Vitals and nursing note reviewed.  Constitutional:      General: She is not in acute distress.    Appearance: She is well-developed.  HENT:     Head: Normocephalic and atraumatic.     Right Ear: Tympanic membrane normal.     Left Ear: Tympanic membrane normal.     Nose: Nose normal.     Mouth/Throat:     Mouth: Mucous membranes are moist.     Pharynx: Oropharynx is clear.  Eyes:     Conjunctiva/sclera: Conjunctivae normal.  Cardiovascular:     Rate and Rhythm: Normal rate and regular rhythm.     Heart sounds: No murmur heard.   Pulmonary:     Effort: Pulmonary effort is  normal. No respiratory distress.     Breath sounds: Normal breath sounds. No wheezing or rhonchi.     Comments: Lung sounds diminished throughout.  Abdominal:     Palpations: Abdomen is soft.     Tenderness: There is no abdominal tenderness. There is no guarding or rebound.  Musculoskeletal:     Cervical back: Neck supple.  Skin:    General: Skin is warm and dry.     Findings: No rash.  Neurological:     General: No focal deficit present.     Mental Status: She is alert and oriented to person, place, and time.     Gait: Gait normal.  Psychiatric:        Mood and Affect: Mood normal.        Behavior: Behavior normal.      UC Treatments / Results  Labs (all labs ordered are listed, but only abnormal results are displayed) Labs Reviewed  SARS CORONAVIRUS 2 (TAT 6-24 HRS)    EKG   Radiology No results found.  Procedures Procedures (including critical care time)  Medications Ordered in UC Medications - No data to display  Initial Impression / Assessment and Plan / UC Course  I have reviewed the triage vital signs and the nursing notes.  Pertinent labs & imaging results that were available during my care of the patient were reviewed by me and considered in my medical decision making (see chart for details).   Viral illness.  PCR COVID pending.  Instructed patient to self quarantine until the test result is  back.  Discussed symptomatic treatment including Tylenol, rest, hydration.  Instructed patient to go to the ED if she has acute worsening symptoms.  Patient agrees to plan of care.    Final Clinical Impressions(s) / UC Diagnoses   Final diagnoses:  Viral illness     Discharge Instructions     Your COVID test is pending.  You should self quarantine until the test result is back.    Take Tylenol as needed for fever or discomfort.  Rest and keep yourself hydrated.    Go to the emergency department if you develop acute worsening symptoms.        ED  Prescriptions    None     PDMP not reviewed this encounter.   Sharion Balloon, NP 09/15/19 1131

## 2019-09-15 NOTE — ED Triage Notes (Signed)
Pt presents with body aches, cough, loss of taste x 4-5 days.  States the SOB and cough are at baseline with her COPD.  Denies fever.

## 2019-09-16 ENCOUNTER — Telehealth: Payer: Self-pay | Admitting: Nurse Practitioner

## 2019-09-16 ENCOUNTER — Encounter: Payer: Self-pay | Admitting: Nurse Practitioner

## 2019-09-16 ENCOUNTER — Other Ambulatory Visit: Payer: Self-pay | Admitting: Nurse Practitioner

## 2019-09-16 DIAGNOSIS — U071 COVID-19: Secondary | ICD-10-CM | POA: Insufficient documentation

## 2019-09-16 DIAGNOSIS — I10 Essential (primary) hypertension: Secondary | ICD-10-CM

## 2019-09-16 DIAGNOSIS — J449 Chronic obstructive pulmonary disease, unspecified: Secondary | ICD-10-CM

## 2019-09-16 NOTE — Progress Notes (Signed)
I connected by phone with [name] on 09/16/2019 at 10:05 AM to discuss the potential use of a new treatment for mild to moderate COVID-19 viral infection in non-hospitalized patients.  This patient is a 59 y.o. female that meets the FDA criteria for Emergency Use Authorization of COVID monoclonal antibody casirivimab/imdevimab.  Has a (+) direct SARS-CoV-2 viral test result  Has mild or moderate COVID-19   Is NOT hospitalized due to COVID-19  Is within 10 days of symptom onset  Has at least one of the high risk factor(s) for progression to severe COVID-19 and/or hospitalization as defined in EUA.  Specific high risk criteria : Cardiovascular disease or hypertension; COPD   I have spoken and communicated the following to the patient or parent/caregiver regarding COVID monoclonal antibody treatment:  1. FDA has authorized the emergency use for the treatment of mild to moderate COVID-19 in adults and pediatric patients with positive results of direct SARS-CoV-2 viral testing who are 16 years of age and older weighing at least 40 kg, and who are at high risk for progressing to severe COVID-19 and/or hospitalization.  2. The significant known and potential risks and benefits of COVID monoclonal antibody, and the extent to which such potential risks and benefits are unknown.  3. Information on available alternative treatments and the risks and benefits of those alternatives, including clinical trials.  4. Patients treated with COVID monoclonal antibody should continue to self-isolate and use infection control measures (e.g., wear mask, isolate, social distance, avoid sharing personal items, clean and disinfect "high touch" surfaces, and frequent handwashing) according to CDC guidelines.   5. The patient or parent/caregiver has the option to accept or refuse COVID monoclonal antibody treatment.  After reviewing this information with the patient, The patient agreed to proceed with  receiving casirivimab\imdevimab infusion and will be provided a copy of the Fact sheet prior to receiving the infusion.  Murray Hodgkins, NP 09/16/2019 10:05 AM

## 2019-09-16 NOTE — Telephone Encounter (Signed)
I connected by phone with Betty Chapman on 09/16/2019 at 10:03 AM to discuss the potential use of a new treatment for mild to moderate COVID-19 viral infection in non-hospitalized patients.  This patient is a 59 y.o. female that meets the FDA criteria for Emergency Use Authorization of COVID monoclonal antibody casirivimab/imdevimab.  Has a (+) direct SARS-CoV-2 viral test result  Has mild or moderate COVID-19   Is NOT hospitalized due to COVID-19  Is within 10 days of symptom onset  Has at least one of the high risk factor(s) for progression to severe COVID-19 and/or hospitalization as defined in EUA.  Specific high risk criteria : Cardiovascular disease or hypertension; COPD   I have spoken and communicated the following to the patient or parent/caregiver regarding COVID monoclonal antibody treatment:  1. FDA has authorized the emergency use for the treatment of mild to moderate COVID-19 in adults and pediatric patients with positive results of direct SARS-CoV-2 viral testing who are 28 years of age and older weighing at least 40 kg, and who are at high risk for progressing to severe COVID-19 and/or hospitalization.  2. The significant known and potential risks and benefits of COVID monoclonal antibody, and the extent to which such potential risks and benefits are unknown.  3. Information on available alternative treatments and the risks and benefits of those alternatives, including clinical trials.  4. Patients treated with COVID monoclonal antibody should continue to self-isolate and use infection control measures (e.g., wear mask, isolate, social distance, avoid sharing personal items, clean and disinfect "high touch" surfaces, and frequent handwashing) according to CDC guidelines.   5. The patient or parent/caregiver has the option to accept or refuse COVID monoclonal antibody treatment.  After reviewing this information with the patient, The patient agreed to proceed with  receiving casirivimab\imdevimab infusion and will be provided a copy of the Fact sheet prior to receiving the infusion.  Murray Hodgkins, NP 09/16/2019 10:03 AM

## 2019-09-17 ENCOUNTER — Ambulatory Visit (HOSPITAL_COMMUNITY)
Admission: RE | Admit: 2019-09-17 | Discharge: 2019-09-17 | Disposition: A | Discharge status: Home / Self Care | Payer: Medicare Other | Source: Ambulatory Visit | Attending: Pulmonary Disease | Admitting: Pulmonary Disease

## 2019-09-17 DIAGNOSIS — U071 COVID-19: Secondary | ICD-10-CM | POA: Diagnosis present

## 2019-09-17 DIAGNOSIS — J449 Chronic obstructive pulmonary disease, unspecified: Secondary | ICD-10-CM | POA: Diagnosis not present

## 2019-09-17 DIAGNOSIS — I10 Essential (primary) hypertension: Secondary | ICD-10-CM | POA: Diagnosis not present

## 2019-09-17 DIAGNOSIS — Z23 Encounter for immunization: Secondary | ICD-10-CM | POA: Diagnosis not present

## 2019-09-17 MED ORDER — METHYLPREDNISOLONE SODIUM SUCC 125 MG IJ SOLR: 125.0000 mg | Freq: Once | INTRAMUSCULAR | Status: DC | PRN

## 2019-09-17 MED ORDER — FAMOTIDINE IN NACL 20-0.9 MG/50ML-% IV SOLN: 20.0000 mg | Freq: Once | INTRAVENOUS | Status: DC | PRN

## 2019-09-17 MED ORDER — EPINEPHRINE 0.3 MG/0.3ML IJ SOAJ: 0.3000 mg | Freq: Once | INTRAMUSCULAR | Status: DC | PRN

## 2019-09-17 MED ORDER — SODIUM CHLORIDE 0.9 % IV SOLN
1200.0000 mg | Freq: Once | INTRAVENOUS | Status: AC
  Administered 2019-09-17: 1200 mg via INTRAVENOUS
  Filled 2019-09-17: qty 10

## 2019-09-17 MED ORDER — ALBUTEROL SULFATE HFA 108 (90 BASE) MCG/ACT IN AERS: 2.0000 | INHALATION_SPRAY | Freq: Once | RESPIRATORY_TRACT | Status: DC | PRN

## 2019-09-17 MED ORDER — DIPHENHYDRAMINE HCL 50 MG/ML IJ SOLN: 50.0000 mg | Freq: Once | INTRAMUSCULAR | Status: DC | PRN

## 2019-09-17 MED ORDER — SODIUM CHLORIDE 0.9 % IV SOLN: INTRAVENOUS | Status: DC | PRN

## 2019-09-17 NOTE — Discharge Instructions (Signed)

## 2019-09-17 NOTE — Progress Notes (Signed)
  Diagnosis: COVID-19  Physician: Asencion Noble  Procedure: Covid Infusion Clinic Med: casirivimab\imdevimab infusion - Provided patient with casirivimab\imdevimab fact sheet for patients, parents and caregivers prior to infusion.  Complications: No immediate complications noted.  Discharge: Discharged home   [place] 09/17/2019

## 2019-09-27 ENCOUNTER — Telehealth: Payer: Medicare Other

## 2019-09-27 ENCOUNTER — Telehealth: Payer: Self-pay

## 2019-09-27 NOTE — Telephone Encounter (Signed)
  Care Management   Outreach Note  09/27/2019 Name: Betty Chapman MRN: [id number] DOB: [birth year]  Referred by: [name], MD Reason for referral : Appointment (DM II HTN)   An unsuccessful telephone outreach was attempted today. The patient was referred to the case management team for assistance with care management and care coordination.   Follow Up Plan: A HIPAA compliant phone message was left for the patient providing contact information and requesting a return call.  The care management team will reach out to the patient again over the next 7-14 days.   Lazaro Arms RN, BSN, [facility] Care Management Coordinator [place] Phone: [phone] Fax: [phone]

## 2019-10-01 ENCOUNTER — Telehealth: Payer: Self-pay | Admitting: *Deleted

## 2019-10-01 NOTE — Chronic Care Management (AMB) (Signed)
  Care Management   Note  10/01/2019 Name: Betty Chapman MRN: [id number] DOB: August 09, 1960  Betty Chapman is a 59 y.o. year old female who is a primary care patient of [name], MD and is actively engaged with the care management team. I reached out to [name] by phone today to assist with re-scheduling a follow up visit with the RN Case Manager.  Follow up plan: Unsuccessful telephone outreach attempt made. A HIPAA compliant phone message was left for the patient providing contact information and requesting a return call. The care management team will reach out to the patient again over the next 7 days. If patient returns call to provider office, please advise to call [place] at [phone].  [place] Management

## 2019-10-05 ENCOUNTER — Other Ambulatory Visit: Payer: Self-pay

## 2019-10-05 ENCOUNTER — Ambulatory Visit (INDEPENDENT_AMBULATORY_CARE_PROVIDER_SITE_OTHER): Payer: Medicare Other | Admitting: Family Medicine

## 2019-10-05 ENCOUNTER — Encounter: Payer: Self-pay | Admitting: Family Medicine

## 2019-10-05 VITALS — BP 140/64 | HR 90 | Ht 60.0 in | Wt 148.0 lb

## 2019-10-05 DIAGNOSIS — U071 COVID-19: Secondary | ICD-10-CM

## 2019-10-05 DIAGNOSIS — H9202 Otalgia, left ear: Secondary | ICD-10-CM

## 2019-10-05 DIAGNOSIS — F17200 Nicotine dependence, unspecified, uncomplicated: Secondary | ICD-10-CM

## 2019-10-05 DIAGNOSIS — E1169 Type 2 diabetes mellitus with other specified complication: Secondary | ICD-10-CM

## 2019-10-05 LAB — POCT GLYCOSYLATED HEMOGLOBIN (HGB A1C): HbA1c, POC (controlled diabetic range): 6.2 % (ref 0.0–7.0)

## 2019-10-05 NOTE — Assessment & Plan Note (Addendum)
Hospital record and outpatient COVID-19 treatment record reviewed. Great recovery s/p monoclonal ab treatment. Counseling done regarding vaccination 90 days after monoclonal treatment. She does not want flu shot or COVID-19 shot. She requested for test site so she can get tested whenever she is getting ready to get on a cruise in Dec.

## 2019-10-05 NOTE — Assessment & Plan Note (Signed)
A1C looks good off meds. I called and discussed result with her. Continue to work on diet and exercise for weight loss. Repeat A1C in 4-6 months. Continue home CBG monitoring.

## 2019-10-05 NOTE — Progress Notes (Signed)
    SUBJECTIVE:   CHIEF COMPLAINT / HPI:   DM2: She is off meds. Working on diet and exercise for weight loss. She feels like her diet had been poor in the past few weeks and she is concern her A1C might be elevated. Her home CBG is usually between 80 - 137.  COVID-19 infection:She had a recent infection s/p monoclonal Ab treatment. She feels much better today. Likely course of exposure is a daughter.   Ear pain:C/O left ear Itchiness and pain, no discharge. This started a few days. No fever, or headache. No pain today.  Smoking:She cut back in the past, but with recent stress (daughter and her had COVID-19) the number of stick per day increased. She will continue to work on cutting back.  Med review: she started Pomegranate juice which is supposed to boost her immunity. I am unable to update this on her med list since there is no option for juice.  PERTINENT  PMH / PSH: PMX reviewed.  OBJECTIVE:   BP 140/64   Pulse 90   Ht 5' (1.524 m)   Wt 148 lb (67.1 kg)   LMP 12/27/2011   SpO2 99%   BMI 28.90 kg/m   Physical Exam Vitals and nursing note reviewed.  Constitutional:      Appearance: Normal appearance.  HENT:     Right Ear: Tympanic membrane and ear canal normal. No drainage, swelling or tenderness. No middle ear effusion. Tympanic membrane is not injected.     Left Ear: Tympanic membrane and ear canal normal. No drainage, swelling or tenderness.  No middle ear effusion. Tympanic membrane is not injected.  Cardiovascular:     Rate and Rhythm: Normal rate and regular rhythm.     Heart sounds: Normal heart sounds. No murmur heard.   Pulmonary:     Effort: Pulmonary effort is normal. No respiratory distress.     Breath sounds: Normal breath sounds. No wheezing.     Comments: Faint bibasal crackles Abdominal:     General: Abdomen is flat. Bowel sounds are normal. There is no distension.     Palpations: There is no mass.     Tenderness: There is no abdominal tenderness.    Musculoskeletal:        General: Normal range of motion.  Psychiatric:        Mood and Affect: Mood normal.        Behavior: Behavior normal.      ASSESSMENT/PLAN:   Diabetes mellitus A1C looks good off meds. I called and discussed result with her. Continue to work on diet and exercise for weight loss. Repeat A1C in 4-6 months. Continue home CBG monitoring.  COVID-19 virus infection Hospital record and outpatient COVID-19 treatment record reviewed. Great recovery s/p monoclonal ab treatment. Counseling done regarding vaccination 90 days after monoclonal treatment. She does not want flu shot or COVID-19 shot. She requested for test site so she can get tested whenever she is getting ready to get on a cruise in Dec.  Tobacco use disorder Counseling offered.   Otalgia/ear irritation: Normal exam ?? Allergy. Monitor closely for now. Tylenol as needed for pain.    Andrena Mews, MD [place]

## 2019-10-05 NOTE — Patient Instructions (Signed)

## 2019-10-05 NOTE — Assessment & Plan Note (Signed)
Counseling offered.

## 2019-10-10 NOTE — Chronic Care Management (AMB) (Signed)
  Care Management   Note  10/10/2019 Name: Betty Chapman MRN: [id number] DOB: [birth year]  Betty Chapman is a 59 y.o. year old female who is a primary care patient of [name], MD and is actively engaged with the care management team. I reached out to [name] by phone today to assist with re-scheduling a follow up visit with the RN Case Manager  Follow up plan: Unsuccessful telephone outreach attempt made. A HIPAA compliant phone message was left for the patient providing contact information and requesting a return call. The care management team will reach out to the patient again over the next 7 days. If patient returns call to provider office, please advise to call [place] at [phone].  [place] Management

## 2019-10-17 NOTE — Chronic Care Management (AMB) (Signed)
  Care Management   Note  10/17/2019 Name: Betty Chapman MRN: [id number] DOB: September 13, 1960  Betty Chapman is a 59 y.o. year old female who is a primary care patient of [name], MD and is actively engaged with the care management team. I reached out to [name] by phone today to assist with re-scheduling a follow up visit with the RN Case Manager.  Follow up plan: Telephone appointment with care management team member scheduled for: 11/05/2019  [place] Management

## 2019-11-01 ENCOUNTER — Encounter: Payer: Self-pay | Admitting: Family Medicine

## 2019-11-01 ENCOUNTER — Telehealth: Payer: Self-pay | Admitting: Family Medicine

## 2019-11-01 NOTE — Telephone Encounter (Signed)
Can you clarify your recent message? Not clear to me.  Especially the comment about the infusion? Thanks.

## 2019-11-01 NOTE — Telephone Encounter (Signed)
Patient is needing doctor to call her [phone]. She states it is regarding her cruse. Thanks

## 2019-11-01 NOTE — Telephone Encounter (Signed)
Called patient and she states that she is going on a cruise 12/08/19.  She is due for her 2nd Covid vaccine post antibody infusion at the end of December.  She needs a note from the doctor including proof her treatment to show for her trip.  Patient will come by and pick up when completed.  Will forward to MD.  Johnney Ou

## 2019-11-01 NOTE — Telephone Encounter (Signed)
Patient clarified that she had not received any vaccines before to infection.  She does need to the letter to also say that due infusion, she can't start the vaccine series until 90 days after and this will be after her cruise.  Patient is aware that letter will be ready to pick up later today. Mykayla Brinton,CMA

## 2019-11-01 NOTE — Telephone Encounter (Signed)
According to our record and from her last visit, she didn't want COVID-19 vaccination.  If she got it from somewhere else, please update the recommend.  I will write a letter stating that she had COVID-19 infection in Sept and was treated with Monoclonal Antibody which is not vaccination but treatment.  She can come in anytime to pick up that letter.

## 2019-11-05 ENCOUNTER — Ambulatory Visit: Payer: Medicare Other

## 2019-11-05 NOTE — Patient Instructions (Signed)
Visit Information  Goals Addressed              This Visit's Progress   .  "I want to lose weight so I can stop taking my bp medication" (pt-stated)        Current Barriers:  [name] Knowledge Deficits related to self management of weight loss  Nurse Case Manager Clinical Goal(s):  [name] Over the next 30 days, patient will work with RN case manger to address needs related to weight loss  Interventions:  . Advised patient to to eat six small meals per day to increase her metabolism. . Patient will start to weigh once a month . She is drinking more water . She is trying to monitor her food intake  . She is walking  Patient states that she has not lost anymore weight but she has not gained any more.  She has maintained her weight at 148 lbs.  She said she is excited because she will be going on a cruise to the [place].    Patient Self Care Activities:  . Self administers medications as prescribed . Attends all scheduled provider appointments . Calls pharmacy for medication refills . Performs ADL's independently . Performs IADL's independently . Calls provider office for new concerns or questions . Unable to independently self manage weight loss  Please see past updates related to this goal by clicking on the "Past Updates" button in the selected goal      .  "Since I started taking my bp medication I no longer check my blood pressures" (pt-stated)        Current Barriers:  [name] Knowledge Deficits related to self management of of HTN as evidenced by not checking BP at regular intervals.  Nurse Case Manager Clinical Goal(s):  [name] Over the next 90 days, patient will demonstrate improved health management independence as evidenced by the patient  checking and recording her blood pressures.   Patient Self Care Activities:  . Attends all scheduled provider appointments . Calls provider office for new concerns or questions . Does not check her Blood pressures regularly and  record  values  . Interventions:  . Provided education to patient re: sodium in diet . Her pressures range between 130/70 to 130/77 . She is taking her medication as prescribed . The patient has been checking her blood pressure 1 to 2 times a week and recording valuess to recognizes any abnormal patterns . Drink water . Continue with her walking  . Patient states that She is checking her BP but not daily . Her BP has been was 140/64 today 137/63 advised her to continue to monitor her pressure being aware of her health and to continue the good work that she is doing.     Please see past updates related to this goal by clicking on the "Past Updates" button in the selected goal      .  I want to stop sticking my fingers three times a day (pt-stated)        GOAL: Understand your diabetes (DM) diagnosis and how to manage it  Diabetes (DM) Self-Health Management Activities:   Understand  your diabetes (DM) diagnosis and symptoms  Diabetes (DM) Definition: A condition in which there is a high level of sugar (glucose) in the blood. This may be caused by several factors.   Some Diabetes signs/symptoms:  . Increased urination . Excessive thirst and hunger . Blurred vision and weakness . Tingling in hands or feet . Sores with  possible infections that heal slowly or do not heal   Things you can do: [name] Control your blood sugar, cholesterol and blood pressure through healthy eating habits and an active lifestyle . Maintain an ideal body weight . Continue to check blood sugar - blood sugars range 89-110    2. Know which medicines are used to manage your diabetes (DM) and take them every day   Name of Medication When I take this medication  Metformin 500 mg  Two times a day medication on hold              4. Record and keep your provider appointments   We discussed the signs and symptoms of hypoglycemia and I am glad you understand and recognize the feeling when your sugar gets low.  (only had 2 episodes since she has been a diabetic) not recent Patient understand to take 6 oz of juice or 6 pieces of hard candy or glucose tablet and 15 minutes later re check your blood sugar.  Reinforced instructions regarding daily blood glucose monitoring. Reviewed medications, nutrition and treatment recommendations.  . Patient stated that she checks her blood sugars TID  Her blood sugars 148 . She is not having any problems . She continues to monitor her food and exercise     Please see past updates related to this goal by clicking on the "Past Updates" button in the selected goal            Ms. Whittingham was given information about Care Management services today including:  1. Care Management services include personalized support from designated clinical staff supervised by her physician, including individualized plan of care and coordination with other care providers 2. 24/7 contact phone numbers for assistance for urgent and routine care needs. 3. The patient may stop CCM services at any time (effective at the end of the month) by phone call to the office staff.  Patient agreed to services and verbal consent obtained.   The patient verbalized understanding of instructions provided today and declined a print copy of patient instruction materials.   Plan: Telephone follow up with [name] over the next 30 days.   Lazaro Arms RN, BSN, [facility] Care Management Coordinator [place] Phone: [phone] Fax: [phone]

## 2019-11-05 NOTE — Chronic Care Management (AMB) (Signed)
RN  Care Management   Follow Up Note   11/05/2019 Name: MAKANA FEIGEL MRN: [id number] DOB: 1960/11/24  Reason for referral : Chronic Care Management (HTN DM II)   Betty Chapman is a 59 y.o. year old female who is a primary care patient of [name], MD. The care management team was consulted for assistance with care management and care coordination needs.    Subjective: " I am doing well"  Objective:  Lab Results  Component Value Date   HGBA1C 6.2 10/05/2019   BP Readings from Last 3 Encounters:  10/05/19 140/64  09/17/19 109/64  09/15/19 140/63     Assessment: Called to follow up with the patient about her HTN/DM II.  She states that she is doing well after she got over her illness.  She has not had any other problems.   Goals Addressed              This Visit's Progress   .  "I want to lose weight so I can stop taking my bp medication" (pt-stated)        Current Barriers:  [name] Knowledge Deficits related to self management of weight loss  Nurse Case Manager Clinical Goal(s):  [name] Over the next 30 days, patient will work with RN case manger to address needs related to weight loss  Interventions:  . Advised patient to to eat six small meals per day to increase her metabolism. . Patient will start to weigh once a month . She is drinking more water . She is trying to monitor her food intake  . She is walking  Patient states that she has not lost anymore weight but she has not gained any more.  She has maintained her weight at 148 lbs.  She said she is excited because she will be going on a cruise to the [place].    Patient Self Care Activities:  . Self administers medications as prescribed . Attends all scheduled provider appointments . Calls pharmacy for medication refills . Performs ADL's independently . Performs IADL's independently . Calls provider office for new concerns or questions . Unable to independently self manage weight  loss  Please see past updates related to this goal by clicking on the "Past Updates" button in the selected goal      .  "Since I started taking my bp medication I no longer check my blood pressures" (pt-stated)        Current Barriers:  [name] Knowledge Deficits related to self management of of HTN as evidenced by not checking BP at regular intervals.  Nurse Case Manager Clinical Goal(s):  [name] Over the next 90 days, patient will demonstrate improved health management independence as evidenced by the patient  checking and recording her blood pressures.   Patient Self Care Activities:  . Attends all scheduled provider appointments . Calls provider office for new concerns or questions . Does not check her Blood pressures regularly and  record values  . Interventions:  . Provided education to patient re: sodium in diet . Her pressures range between 130/70 to 130/77 . She is taking her medication as prescribed . The patient has been checking her blood pressure 1 to 2 times a week and recording valuess to recognizes any abnormal patterns . Drink water . Continue with her walking  . Patient states that She is checking her BP but not daily . Her BP has been was 140/64 today 137/63 advised her to continue to monitor  her pressure being aware of her health and to continue the good work that she is doing.     Please see past updates related to this goal by clicking on the "Past Updates" button in the selected goal      .  I want to stop sticking my fingers three times a day (pt-stated)        GOAL: Understand your diabetes (DM) diagnosis and how to manage it  Diabetes (DM) Self-Health Management Activities:   Understand  your diabetes (DM) diagnosis and symptoms  Diabetes (DM) Definition: A condition in which there is a high level of sugar (glucose) in the blood. This may be caused by several factors.   Some Diabetes signs/symptoms:  . Increased urination . Excessive thirst and  hunger . Blurred vision and weakness . Tingling in hands or feet . Sores with possible infections that heal slowly or do not heal   Things you can do: [name] Control your blood sugar, cholesterol and blood pressure through healthy eating habits and an active lifestyle . Maintain an ideal body weight . Continue to check blood sugar - blood sugars range 89-110    2. Know which medicines are used to manage your diabetes (DM) and take them every day   Name of Medication When I take this medication  Metformin 500 mg  Two times a day medication on hold              4. Record and keep your provider appointments   We discussed the signs and symptoms of hypoglycemia and I am glad you understand and recognize the feeling when your sugar gets low. (only had 2 episodes since she has been a diabetic) not recent Patient understand to take 6 oz of juice or 6 pieces of hard candy or glucose tablet and 15 minutes later re check your blood sugar.  Reinforced instructions regarding daily blood glucose monitoring. Reviewed medications, nutrition and treatment recommendations.  . Patient stated that she checks her blood sugars TID  Her blood sugars 148 . She is not having any problems . She continues to monitor her food and exercise     Please see past updates related to this goal by clicking on the "Past Updates" button in the selected goal            Review of patient status, including review of consultants reports, relevant laboratory and other test results, and collaboration with appropriate care team members and the patient's provider was performed as part of comprehensive patient evaluation and provision of chronic care management services.    SDOH (Social Determinants of Health) assessments performed: No See Care Plan activities for detailed interventions related to SDOH)         Plan: Telephone follow up with [name] over the next 30 days.   Lazaro Arms RN, BSN,  [facility] Care Management Coordinator [place] Phone: [phone] Fax: [phone]

## 2019-12-05 ENCOUNTER — Ambulatory Visit: Payer: Medicare Other

## 2019-12-06 DIAGNOSIS — Z20822 Contact with and (suspected) exposure to covid-19: Secondary | ICD-10-CM | POA: Diagnosis not present

## 2019-12-06 NOTE — Patient Instructions (Signed)
Visit Information  Goals Addressed            This Visit's Progress   . Monitor and Manage My Blood Sugar and HTN       Patient Goals/Self Care Activities related to HTN and DMII:   Patient verbalizes understanding of plan Self-administers medications as prescribed  Calls pharmacy for medication refills  Call's provider office for new concerns or questions  check blood sugar at prescribed times  check blood sugar if I feel it is too high or too low  take the blood sugar meter to all doctor visits  schedule appointment with eye doctor  check feet daily for cuts, sores or redness  trim toenails straight across  wash and dry feet carefully every day  wear comfortable, cotton socks  wear comfortable, well-fitting shoes  Self administers medications as prescribed  Attends all scheduled provider appointments  Calls provider office for new concerns, questions, or BP outside discussed parameters  Checks BP and records as discussed  Follows a low sodium diet/DASH diet        Ms. Persichetti was given information about Care Management services today including:  1. Care Management services include personalized support from designated clinical staff supervised by her physician, including individualized plan of care and coordination with other care providers 2. 24/7 contact phone numbers for assistance for urgent and routine care needs. 3. The patient may stop CCM services at any time (effective at the end of the month) by phone call to the office staff.  Patient agreed to services and verbal consent obtained.   The patient verbalized understanding of instructions, educational materials, and care plan provided today and declined offer to receive copy of patient instructions, educational materials, and care plan.   The care management team will reach out to the patient again over the next 30 days.   Lazaro Arms RN, BSN, [facility] Care Management Coordinator [place] Phone: [phone] I Fax: [phone]

## 2019-12-06 NOTE — Chronic Care Management (AMB) (Signed)
RN  Care Management   Follow Up Note   12/06/2019 Name: Betty Chapman MRN: [id number] DOB: 1960/02/20  Reason for referral : Chronic Care Management (HTN DM II)   Betty Chapman is a 59 y.o. year old female who is a primary care patient of [name], MD. The care management team was consulted for assistance with care management and care coordination needs.      Assessment:Called the patient to follow up on  BP and Dm II and she states that she is doing well.  Patient Care Plan: RN Care Manager  Problem Identified: (Diabetes, Type 2 and HTN   Priority: Low  Onset Date: 11/14/2018  Note:    Current Barriers:  . Chronic Disease Management support, education, and care coordination needs related to HTN and DMII  Clinical Goal(s) related to HTN and DMII:  Over the next 90 days, patient will:  . Work with the care management team to address educational, disease management, and care coordination needs  . Call provider office for new or worsened signs and symptoms  . Call care management team with questions or concerns . Verbalize basic understanding of patient centered plan of care established today  Interventions related to HTN and DMII:  . Evaluation of current treatment plans and patient's adherence to plan as established by provider . Assessed patient understanding of disease states . Assessed patient's education and care coordination needs . Provided disease specific education to patient  . Collaborated with appropriate clinical care team members regarding patient needs . healthy diet promoted . pain assessed and managed . reduction of dietary sodium encouraged . medication adherence assessment completed-patient is taking her medication as prescribed . support and encouragement provided . activity based on tolerance and functional limitations encouraged . healthy lifestyle promoted . modest weight loss  promoted . reduction of sedentary activity  encouraged . blood glucose monitoring encouraged . blood glucose readings reviewed-85-110 . Patient states that her BP has been staying steady at 119/59 and 134/62  Patient Goals/Self Care Activities related to HTN and DMII:   Patient verbalizes understanding of plan Self-administers medications as prescribed  Calls pharmacy for medication refills  Call's provider office for new concerns or questions  check blood sugar at prescribed times  check blood sugar if I feel it is too high or too low  take the blood sugar meter to all doctor visits  schedule appointment with eye doctor  check feet daily for cuts, sores or redness  trim toenails straight across  wash and dry feet carefully every day  wear comfortable, cotton socks  wear comfortable, well-fitting shoes  Self administers medications as prescribed  Attends all scheduled provider appointments  Calls provider office for new concerns, questions, or BP outside discussed parameters  Checks BP and records as discussed  Follows a low sodium diet/DASH diet  Follow up Plan: The care management team will reach out to the patient again over the next 30 days.        Review of patient status, including review of consultants reports, relevant laboratory and other test results, and collaboration with appropriate care team members and the patient's provider was performed as part of comprehensive patient evaluation and provision of chronic care management services.    SDOH (Social Determinants of Health) assessments performed: No See Care Plan activities for detailed interventions related to SDOH)     Desha, BSN, [place] Management Coordinator [place] Phone: [phone] Fax: [phone]

## 2019-12-07 NOTE — Telephone Encounter (Signed)
This encounter was created in error - please disregard.  This encounter was created in error - please disregard.

## 2020-01-03 ENCOUNTER — Other Ambulatory Visit: Payer: Self-pay

## 2020-01-03 ENCOUNTER — Telehealth: Payer: Medicare Other

## 2020-01-03 ENCOUNTER — Telehealth: Payer: Self-pay | Admitting: *Deleted

## 2020-01-03 MED ORDER — ACCU-CHEK AVIVA PLUS VI STRP: ORAL_STRIP | 3 refills | Status: DC

## 2020-01-03 MED ORDER — EZETIMIBE 10 MG PO TABS: 10.0000 mg | ORAL_TABLET | Freq: Every day | ORAL | 2 refills | Status: DC

## 2020-01-03 MED ORDER — ACCU-CHEK SOFTCLIX LANCETS MISC: 3 refills | Status: DC

## 2020-01-03 NOTE — Chronic Care Management (AMB) (Signed)
  Care Management   Note  01/03/2020 Name: Betty Chapman MRN: [id number] DOB: 07/30/1960  Betty Chapman is a 59 y.o. year old female who is a primary care patient of Doreene Eland, MD and is actively engaged with the care management team. I reached out to [name] by phone today to assist with re-scheduling a follow up visit with the RN Case Manager  Follow up plan: Telephone appointment with care management team member scheduled for:01/14/2020  [facility] Guide, Embedded Care Coordination [facility] Management

## 2020-01-03 NOTE — Chronic Care Management (AMB) (Signed)
  Care Management   Note  01/03/2020 Name: Betty Chapman MRN: [id number] DOB: March 14, 1960  Betty Chapman is a 59 y.o. year old female who is a primary care patient of Doreene Eland, MD and is actively engaged with the care management team. I reached out to [name] by phone today to assist with re-scheduling a follow up visit with the RN Case Manager  Follow up plan: Unsuccessful telephone outreach attempt made. A HIPAA compliant phone message was left for the patient providing contact information and requesting a return call.  The care management team will reach out to the patient again over the next 7 days.  If patient returns call to provider office, please advise to call Embedded Care Management Care Guide Betty Chapman at [phone].  Betty Chapman  Care Guide, Embedded Care Coordination [facility] Management

## 2020-01-14 ENCOUNTER — Ambulatory Visit: Payer: Medicare Other

## 2020-01-15 NOTE — Patient Instructions (Signed)
Ms. Hackenberg  it was nice speaking with you. Please call me directly [phone] if you have questions about the goals we discussed.  Goals Addressed            This Visit's Progress   . Monitor and Manage My Blood Sugar and HTN       Timeframe:  Long-Range Goal Priority:  Medium Start Date:    12/05/19                         Expected End Date:     05/03/20                   Patient Goals/Self Care Activities related to HTN and DMII:   Patient verbalizes understanding of plan Self-administers medications as prescribed  Calls pharmacy for medication refills  Call's provider office for new concerns or questions  check blood sugar at prescribed times  check blood sugar if I feel it is too high or too low  take the blood sugar meter to all doctor visits  Self administers medications as prescribed  Attends all scheduled provider appointments  Calls provider office for new concerns, questions, or BP outside discussed parameters  Checks BP and records as discussed  Follows a low sodium diet/DASH diet        Patient Care Plan: RN Care Manager  Problem Identified: (Diabetes, Type 2 and HTN   Priority: Low  Onset Date: 11/14/2018   Current Barriers:  . Chronic Disease Management support, education, and care coordination needs related to HTN and DMII  Clinical Goal(s) related to HTN and DMII:  Over the next 90 days, patient will:  . Work with the care management team to address educational, disease management, and care coordination needs  . Call provider office for new or worsened signs and symptoms  . Call care management team with questions or concerns . Verbalize basic understanding of patient centered plan of care established today  Interventions related to HTN and DMII:  . Evaluation of current treatment plans and patient's adherence to plan as established by provider . Assessed patient understanding of disease states . Assessed patient's education and care  coordination needs . Provided disease specific education to patient  . Collaborated with appropriate clinical care team members regarding patient needs . healthy diet promoted . pain assessed and managed . reduction of dietary sodium encouraged . medication adherence assessment completed-patient is taking her medication as prescribed . support and encouragement provided . activity based on tolerance and functional limitations encouraged . healthy lifestyle promoted . reduction of sedentary activity encouraged . blood glucose monitoring encouraged . blood glucose readings reviewed-109 . Patient states that her BP has been staying steady at 130/62 . Patient went on her cruise and her blood sugars were elevated at first but she was able to lower them.  Patient Goals/Self Care Activities related to HTN and DMII:   Patient verbalizes understanding of plan Self-administers medications as prescribed  Calls pharmacy for medication refills  Call's provider office for new concerns or questions  check blood sugar at prescribed times  check blood sugar if I feel it is too high or too low  take the blood sugar meter to all doctor visits  Self administers medications as prescribed  Attends all scheduled provider appointments  Calls provider office for new concerns, questions, or BP outside discussed parameters  Checks BP and records as discussed  Follows a low sodium diet/DASH diet  Follow  up Plan: The care management team will reach out to the patient again over the next 30 days and patient agreed to follow up..        Ms. Gugel received Care Management services today:  1. Care Management services include personalized support from designated clinical staff supervised by her physician, including individualized plan of care and coordination with other care providers 2. 24/7 contact [phone] for assistance for urgent and routine care needs. 3. Care Management are voluntary  services and be declined at any time by calling the office.  The patient verbalized understanding of instructions provided today and declined a print copy of patient instruction materials.      Lazaro Arms, RN

## 2020-01-15 NOTE — Chronic Care Management (AMB) (Signed)
Care Management    RN Visit Note  01/15/2020 Name: Betty Chapman MRN: [id number] DOB: 10-Jul-1960  Subjective: Betty Chapman is a 60 y.o. year old female who is a primary care patient of [name], MD. The care management team was consulted for assistance with disease management and care coordination needs.    Engaged with patient by telephone for follow up visit in response to provider referral for case management and/or care coordination services.   Consent to Services:   Ms. Demo was given information about Care Management services today including:  1. Care Management services includes personalized support from designated clinical staff supervised by her physician, including individualized plan of care and coordination with other care providers 2. 24/7 contact phone numbers for assistance for urgent and routine care needs. 3. The patient may stop case management services at any time by phone call to the office staff.  Patient agreed to services and consent obtained.   Assessment/Interventions: Review of patient past medical history, allergies, medications, health status, including review of consultants reports, laboratory and other test data, was performed as part of comprehensive evaluation and provision of chronic care management services.   SDOH (Social Determinants of Health) assessments and interventions performed:    Care Plan  Allergies  Allergen Reactions  . Mushroom Extract Complex Anaphylaxis and Rash  . Pravastatin Sodium Hives and Itching  . Statins     Hives    Outpatient Encounter Medications as of 01/14/2020  Medication Sig Note  . Accu-Chek Softclix Lancets lancets TEST ONCE DAILY AS DIRECTED   . albuterol (PROVENTIL) (2.5 MG/3ML) 0.083% nebulizer solution Take 3 mLs (2.5 mg total) by nebulization every 6 (six) hours as needed for wheezing or shortness of breath. (Patient not taking: Reported on 10/05/2019) 11/14/2018: Only uses when she  needs to  . albuterol (VENTOLIN HFA) 108 (90 Base) MCG/ACT inhaler Inhale 2 puffs into the lungs every 6 (six) hours as needed for wheezing or shortness of breath. (Patient not taking: Reported on 10/05/2019)   . aspirin 81 MG tablet Take 1 tablet (81 mg total) by mouth daily. 11/14/2018: Patient states that she will take 1 to 2 times a week.  She feels that aspirin will "mess with her stomach.  . baclofen (LIORESAL) 10 MG tablet Take 1 tablet (10 mg total) by mouth 2 (two) times daily as needed for muscle spasms. (Patient not taking: Reported on 10/05/2019)   . blood glucose meter kit and supplies KIT Accu-Chek Aviva Plus Use up to TID daily as directed. (FOR ICD-9 250.00, 250.01).   . DULERA 200-5 MCG/ACT AERO USE 2 PUFFS BY MOUTH TWO  TIMES DAILY   . ezetimibe (ZETIA) 10 MG tablet Take 1 tablet (10 mg total) by mouth daily.   . famotidine (PEPCID) 10 MG tablet Take 10 mg by mouth 2 (two) times daily. (Patient not taking: Reported on 10/05/2019) 11/14/2018: Takes over the counter med  . glucose blood (ACCU-CHEK AVIVA PLUS) test strip TEST UP TO ONCE DAILY AS DIRECTED   . hydrOXYzine (ATARAX/VISTARIL) 10 MG tablet Take 1 tablet (10 mg total) by mouth 3 (three) times daily as needed for itching. (Patient not taking: Reported on 02/06/2019)   . ibuprofen (ADVIL) 400 MG tablet Take 1 tablet (400 mg total) by mouth every 8 (eight) hours as needed for moderate pain. (Patient not taking: Reported on 10/05/2019)   . lisinopril (ZESTRIL) 20 MG tablet TAKE 1 TABLET BY MOUTH  DAILY   . Omega-3 Fatty  Acids (FISH OIL) 1000 MG CAPS Take 1 capsule by mouth 2 (two) times daily. (Patient not taking: Reported on 10/05/2019)   . SPIRIVA HANDIHALER 18 MCG inhalation capsule INHALE THE CONTENTS OF 1  CAPSULE VIA HANDIHALER  DAILY   . triamcinolone ointment (KENALOG) 0.5 % Apply 1 application topically 2 (two) times daily. (Patient not taking: Reported on 02/06/2019) 11/14/2018: As needed   . Turmeric (QC TUMERIC COMPLEX) 500 MG  CAPS Take by mouth.    No facility-administered encounter medications on file as of 01/14/2020.    Patient Active Problem List   Diagnosis Date Noted  . COVID-19 virus infection   . Hyperlipidemia associated with type 2 diabetes mellitus ([place]) 02/06/2019  . Unintended weight loss 02/06/2019  . Overweight 12/18/2018  . Allergy to statin medication 06/13/2018  . Diastolic CHF ([place]) 56/81/2751  . Major depression 05/17/2017  . Lipoma of arm 05/17/2017  . GERD (gastroesophageal reflux disease) 11/23/2016  . Diabetes mellitus ([place]) 07/16/2014  . Epidermal cyst of face 07/16/2014  . Callus of foot 07/16/2014  . Schatzki's ring   . COPD (chronic obstructive pulmonary disease) ([place]) 02/08/2012  . Chronic back pain 07/27/2010  . TRANSIENT ISCHEMIC ATTACK 06/04/2008  . Tobacco use disorder 03/01/2008  . Hypertension 01/30/2008    Conditions to be addressed/monitored: HTN and DMII  Care Plan : RN Care Manager  Updates made by Lazaro Arms, RN since 01/15/2020 12:00 AM  Problem: (Diabetes, Type 2 and HTN   Priority: Low  Onset Date: 11/14/2018   Current Barriers:  . Chronic Disease Management support, education, and care coordination needs related to HTN and DMII  Clinical Goal(s) related to HTN and DMII:  Over the next 90 days, patient will:  . Work with the care management team to address educational, disease management, and care coordination needs  . Call provider office for new or worsened signs and symptoms  . Call care management team with questions or concerns . Verbalize basic understanding of patient centered plan of care established today  Interventions related to HTN and DMII:  . Evaluation of current treatment plans and patient's adherence to plan as established by provider . Assessed patient understanding of disease states . Assessed patient's education and care coordination needs . Provided disease specific education to patient  . Collaborated with appropriate  clinical care team members regarding patient needs . healthy diet promoted . pain assessed and managed . reduction of dietary sodium encouraged . medication adherence assessment completed-patient is taking her medication as prescribed . support and encouragement provided . activity based on tolerance and functional limitations encouraged . healthy lifestyle promoted . reduction of sedentary activity encouraged . blood glucose monitoring encouraged . blood glucose readings reviewed-109 . Patient states that her BP has been staying steady at 130/62 . Patient went on her cruise and her blood sugars were elevated at first but she was able to lower them.  Patient Goals/Self Care Activities related to HTN and DMII:   Patient verbalizes understanding of plan Self-administers medications as prescribed  Calls pharmacy for medication refills  Call's provider office for new concerns or questions  check blood sugar at prescribed times  check blood sugar if I feel it is too high or too low  take the blood sugar meter to all doctor visits  Self administers medications as prescribed  Attends all scheduled provider appointments  Calls provider office for new concerns, questions, or BP outside discussed parameters  Checks BP and records as discussed  Follows a  low sodium diet/DASH diet  Follow up Plan: The care management team will reach out to the patient again over the next 30 days and patient agreed to follow up.Lazaro Arms RN, BSN, [facility] [place] Care Management Coordinator [place] Phone: [phone] I Fax: [phone]

## 2020-02-06 ENCOUNTER — Ambulatory Visit: Payer: Medicare Other

## 2020-02-06 NOTE — Chronic Care Management (AMB) (Signed)
Care Management    RN Visit Note  02/06/2020 Name: Betty Chapman MRN: [id number] DOB: [birth year]  Subjective: Betty Chapman is a 60 y.o. year old female who is a primary care patient of Betty Feil, MD. The care management team was consulted for assistance with disease management and care coordination needs.    Engaged with patient by telephone for follow up visit in response to provider referral for case management and/or care coordination services.   Consent to Services:   Ms. Betty Chapman was given information about Care Management services today including:  1. Care Management services includes personalized support from designated clinical staff supervised by her physician, including individualized plan of care and coordination with other care providers 2. 24/7 contact phone numbers for assistance for urgent and routine care needs. 3. The patient may stop case management services at any time by phone call to the office staff.  Patient agreed to services and consent obtained.    Assessment: Patient contiues to make progress with her diabetes and BP.[name] See Care Plan below for interventions and patient self-care actives. Follow up Plan: Patient does not require or desire continued follow-up. Will contact the office if needed  Review of patient past medical history, allergies, medications, health status, including review of consultants reports, laboratory and other test data, was performed as part of comprehensive evaluation and provision of chronic care management services.   SDOH (Social Determinants of Health) assessments and interventions performed:    Care Plan  Allergies  Allergen Reactions  . Mushroom Extract Complex Anaphylaxis and Rash  . Pravastatin Sodium Hives and Itching  . Statins     Hives    Outpatient Encounter Medications as of 02/06/2020  Medication Sig Note  . Accu-Chek Softclix Lancets lancets TEST ONCE DAILY AS DIRECTED   . albuterol (PROVENTIL)  (2.5 MG/3ML) 0.083% nebulizer solution Take 3 mLs (2.5 mg total) by nebulization every 6 (six) hours as needed for wheezing or shortness of breath. (Patient not taking: Reported on 10/05/2019) 11/14/2018: Only uses when she needs to  . albuterol (VENTOLIN HFA) 108 (90 Base) MCG/ACT inhaler Inhale 2 puffs into the lungs every 6 (six) hours as needed for wheezing or shortness of breath. (Patient not taking: Reported on 10/05/2019)   . aspirin 81 MG tablet Take 1 tablet (81 mg total) by mouth daily. 11/14/2018: Patient states that she will take 1 to 2 times a week.  She feels that aspirin will "mess with her stomach.  . baclofen (LIORESAL) 10 MG tablet Take 1 tablet (10 mg total) by mouth 2 (two) times daily as needed for muscle spasms. (Patient not taking: Reported on 10/05/2019)   . blood glucose meter kit and supplies KIT Accu-Chek Aviva Plus Use up to TID daily as directed. (FOR ICD-9 250.00, 250.01).   . DULERA 200-5 MCG/ACT AERO USE 2 PUFFS BY MOUTH TWO  TIMES DAILY   . ezetimibe (ZETIA) 10 MG tablet Take 1 tablet (10 mg total) by mouth daily.   . famotidine (PEPCID) 10 MG tablet Take 10 mg by mouth 2 (two) times daily. (Patient not taking: Reported on 10/05/2019) 11/14/2018: Takes over the counter med  . glucose blood (ACCU-CHEK AVIVA PLUS) test strip TEST UP TO ONCE DAILY AS DIRECTED   . hydrOXYzine (ATARAX/VISTARIL) 10 MG tablet Take 1 tablet (10 mg total) by mouth 3 (three) times daily as needed for itching. (Patient not taking: Reported on 02/06/2019)   . ibuprofen (ADVIL) 400 MG tablet Take 1 tablet (400  mg total) by mouth every 8 (eight) hours as needed for moderate pain. (Patient not taking: Reported on 10/05/2019)   . lisinopril (ZESTRIL) 20 MG tablet TAKE 1 TABLET BY MOUTH  DAILY   . Omega-3 Fatty Acids (FISH OIL) 1000 MG CAPS Take 1 capsule by mouth 2 (two) times daily. (Patient not taking: Reported on 10/05/2019)   . SPIRIVA HANDIHALER 18 MCG inhalation capsule INHALE THE CONTENTS OF 1  CAPSULE  VIA HANDIHALER  DAILY   . triamcinolone ointment (KENALOG) 0.5 % Apply 1 application topically 2 (two) times daily. (Patient not taking: Reported on 02/06/2019) 11/14/2018: As needed   . Turmeric (QC TUMERIC COMPLEX) 500 MG CAPS Take by mouth.    No facility-administered encounter medications on file as of 02/06/2020.    Patient Active Problem List   Diagnosis Date Noted  . COVID-19 virus infection   . Hyperlipidemia associated with type 2 diabetes mellitus ([place]) 02/06/2019  . Unintended weight loss 02/06/2019  . Overweight 12/18/2018  . Allergy to statin medication 06/13/2018  . Diastolic CHF ([place]) 40/81/4481  . Major depression 05/17/2017  . Lipoma of arm 05/17/2017  . GERD (gastroesophageal reflux disease) 11/23/2016  . Diabetes mellitus ([place]) 07/16/2014  . Epidermal cyst of face 07/16/2014  . Callus of foot 07/16/2014  . Schatzki's ring   . COPD (chronic obstructive pulmonary disease) ([place]) 02/08/2012  . Chronic back pain 07/27/2010  . TRANSIENT ISCHEMIC ATTACK 06/04/2008  . Tobacco use disorder 03/01/2008  . Hypertension 01/30/2008    Conditions to be addressed/monitored: HTN and DMII  Care Plan : RN Care Manager  Updates made by Lazaro Arms, RN since 02/06/2020 12:00 AM  Problem: (Diabetes, Type 2 and HTN Resolved 02/06/2020  Priority: Low  Onset Date: 11/14/2018   Current Barriers:  . Chronic Disease Management support, education, and care coordination needs related to HTN and DMII  Clinical Goal(s) related to HTN and DMII:  Over the next 90 days, patient will:  . Work with the care management team to address educational, disease management, and care coordination needs  . Call provider office for new or worsened signs and symptoms  . Call care management team with questions or concerns . Verbalize basic understanding of patient centered plan of care established today  Interventions related to HTN and DMII:  . Evaluation of current treatment plans and patient's  adherence to plan as established by provider . Assessed patient understanding of disease states . Assessed patient's education and care coordination needs . Provided disease specific education to patient  . Collaborated with appropriate clinical care team members regarding patient needs . healthy diet promoted . pain assessed and managed . reduction of dietary sodium encouraged . medication adherence assessment completed-patient is taking her medication as prescribed . support and encouragement provided . activity based on tolerance and functional limitations encouraged . healthy lifestyle promoted . reduction of sedentary activity encouraged . blood glucose monitoring encouraged . blood glucose readings reviewed-89-109 . Patient states that her BP has been staying steady at 120/60-123/78 . Patient states that she is doing well and is not having any issues. As a mutual decision to decide to close her case.  She was advised for any reason that she starts to have any problems to notify her PCP and I can come back and assist her with any of her needs.    Patient Goals/Self Care Activities related to HTN and DMII:   Patient verbalizes understanding of plan Self-administers medications as prescribed  Calls pharmacy for medication  refills  Call's provider office for new concerns or questions  check blood sugar at prescribed times  check blood sugar if I feel it is too high or too low  take the blood sugar meter to all doctor visits  Self administers medications as prescribed  Attends all scheduled provider appointments  Calls provider office for new concerns, questions, or BP outside discussed parameters  Checks BP and records as discussed  Follows a low sodium diet/DASH diet      Lazaro Arms RN, BSN, [facility] Care Management Coordinator [place] Phone: [phone] I Fax: [phone]

## 2020-02-06 NOTE — Patient Instructions (Signed)
Visit Information  Betty Chapman  it was nice speaking with you. Please call me directly [phone] if you have questions about the goals we discussed.  Goals Addressed            This Visit's Progress   . COMPLETED: Monitor and Manage My Blood Sugar and HTN       Timeframe:  Long-Range Goal Priority:  Medium Start Date:    12/05/19                         Expected End Date:     02/06/20           Patient Goals/Self Care Activities related to HTN and DMII:   Patient verbalizes understanding of plan Self-administers medications as prescribed  Calls pharmacy for medication refills  Call's provider office for new concerns or questions  check blood sugar at prescribed times  check blood sugar if I feel it is too high or too low  take the blood sugar meter to all doctor visits  Self administers medications as prescribed  Attends all scheduled provider appointments  Calls provider office for new concerns, questions, or BP outside discussed parameters  Checks BP and records as discussed  Follows a low sodium diet/DASH diet        The patient verbalized understanding of instructions, educational materials, and care plan provided today and declined offer to receive copy of patient instructions, educational materials, and care plan.   Follow up Plan: Patient does not require or desire continued follow-up. Will contact the office if needed  Lazaro Arms, RN

## 2020-02-15 ENCOUNTER — Other Ambulatory Visit: Payer: Self-pay | Admitting: Family Medicine

## 2020-02-15 DIAGNOSIS — Z1231 Encounter for screening mammogram for malignant neoplasm of breast: Secondary | ICD-10-CM

## 2020-02-22 DIAGNOSIS — E119 Type 2 diabetes mellitus without complications: Secondary | ICD-10-CM | POA: Diagnosis not present

## 2020-02-22 LAB — HM DIABETES EYE EXAM

## 2020-03-05 ENCOUNTER — Other Ambulatory Visit: Payer: Self-pay | Admitting: Family Medicine

## 2020-03-17 ENCOUNTER — Encounter: Payer: Self-pay | Admitting: Family Medicine

## 2020-03-18 ENCOUNTER — Encounter: Payer: Self-pay | Admitting: Family Medicine

## 2020-03-18 ENCOUNTER — Other Ambulatory Visit: Payer: Self-pay

## 2020-03-18 ENCOUNTER — Ambulatory Visit (INDEPENDENT_AMBULATORY_CARE_PROVIDER_SITE_OTHER): Payer: Medicare Other | Admitting: Family Medicine

## 2020-03-18 VITALS — BP 114/64 | HR 71 | Ht 60.0 in | Wt 151.4 lb

## 2020-03-18 DIAGNOSIS — J449 Chronic obstructive pulmonary disease, unspecified: Secondary | ICD-10-CM | POA: Diagnosis not present

## 2020-03-18 DIAGNOSIS — I5030 Unspecified diastolic (congestive) heart failure: Secondary | ICD-10-CM | POA: Diagnosis not present

## 2020-03-18 DIAGNOSIS — E1169 Type 2 diabetes mellitus with other specified complication: Secondary | ICD-10-CM | POA: Diagnosis not present

## 2020-03-18 DIAGNOSIS — E785 Hyperlipidemia, unspecified: Secondary | ICD-10-CM

## 2020-03-18 DIAGNOSIS — K219 Gastro-esophageal reflux disease without esophagitis: Secondary | ICD-10-CM | POA: Diagnosis not present

## 2020-03-18 DIAGNOSIS — G459 Transient cerebral ischemic attack, unspecified: Secondary | ICD-10-CM

## 2020-03-18 LAB — POCT GLYCOSYLATED HEMOGLOBIN (HGB A1C): HbA1c, POC (controlled diabetic range): 6.4 % (ref 0.0–7.0)

## 2020-03-18 NOTE — Assessment & Plan Note (Signed)
A1C increased a bit to 6.4 Monitor closely. Repeat in 3-6 months.

## 2020-03-18 NOTE — Assessment & Plan Note (Signed)
Continue daily ASA. If GI bleed or worsening GERD, may consider holding it.

## 2020-03-18 NOTE — Patient Instructions (Signed)
Food Choices for Gastroesophageal Reflux Disease, Adult When you have gastroesophageal reflux disease (GERD), the foods you eat and your eating habits are very important. Choosing the right foods can help ease your discomfort. Think about working with a food expert (dietitian) to help you make good choices. What are tips for following this plan? Reading food labels  Look for foods that are low in saturated fat. Foods that may help with your symptoms include: ? Foods that have less than 5% of daily value (DV) of fat. ? Foods that have 0 grams of trans fat. Cooking  Do not fry your food.  Cook your food by baking, steaming, grilling, or broiling. These are all methods that do not need a lot of fat for cooking.  To add flavor, try to use herbs that are low in spice and acidity. Meal planning  Choose healthy foods that are low in fat, such as: ? Fruits and vegetables. ? Whole grains. ? Low-fat dairy products. ? Lean meats, fish, and poultry.  Eat small meals often instead of eating 3 large meals each day. Eat your meals slowly in a place where you are relaxed. Avoid bending over or lying down until 2-3 hours after eating.  Limit high-fat foods such as fatty meats or fried foods.  Limit your intake of fatty foods, such as oils, butter, and shortening.  Avoid the following as told by your doctor: ? Foods that cause symptoms. These may be different for different people. Keep a food diary to keep track of foods that cause symptoms. ? Alcohol. ? Drinking a lot of liquid with meals. ? Eating meals during the 2-3 hours before bed.   Lifestyle  Stay at a healthy weight. Ask your doctor what weight is healthy for you. If you need to lose weight, work with your doctor to do so safely.  Exercise for at least 30 minutes on 5 or more days each week, or as told by your doctor.  Wear loose-fitting clothes.  Do not smoke or use any products that contain nicotine or tobacco. If you need help  quitting, ask your doctor.  Sleep with the head of your bed higher than your feet. Use a wedge under the mattress or blocks under the bed frame to raise the head of the bed.  Chew sugar-free gum after meals. What foods should eat? Eat a healthy, well-balanced diet of fruits, vegetables, whole grains, low-fat dairy products, lean meats, fish, and poultry. Each person is different. Foods that may cause symptoms in one person may not cause any symptoms in another person. Work with your doctor to find foods that are safe for you. The items listed above may not be a complete list of what you can eat and drink. Contact a food expert for more options.   What foods should I avoid? Limiting some of these foods may help in managing the symptoms of GERD. Everyone is different. Talk with a food expert or your doctor to help you find the exact foods to avoid, if any. Fruits Any fruits prepared with added fat. Any fruits that cause symptoms. For some people, this may include citrus fruits, such as oranges, grapefruit, pineapple, and lemons. Vegetables Deep-fried vegetables. French fries. Any vegetables prepared with added fat. Any vegetables that cause symptoms. For some people, this may include tomatoes and tomato products, chili peppers, onions and garlic, and horseradish. Grains Pastries or quick breads with added fat. Meats and other proteins High-fat meats, such as fatty beef or pork,   hot dogs, ribs, ham, sausage, salami, and bacon. Fried meat or protein, including fried fish and fried chicken. Nuts and nut butters, in large amounts. Dairy Whole milk and chocolate milk. Sour cream. Cream. Ice cream. Cream cheese. Milkshakes. Fats and oils Butter. Margarine. Shortening. Ghee. Beverages Coffee and tea, with or without caffeine. Carbonated beverages. Sodas. Energy drinks. Fruit juice made with acidic fruits, such as orange or grapefruit. Tomato juice. Alcoholic drinks. Sweets and desserts Chocolate and  cocoa. Donuts. Seasonings and condiments Pepper. Peppermint and spearmint. Added salt. Any condiments, herbs, or seasonings that cause symptoms. For some people, this may include curry, hot sauce, or vinegar-based salad dressings. The items listed above may not be a complete list of what you should not eat and drink. Contact a food expert for more options. Questions to ask your doctor Diet and lifestyle changes are often the first steps that are taken to manage symptoms of GERD. If diet and lifestyle changes do not help, talk with your doctor about taking medicines. Where to find more information  International Foundation for Gastrointestinal Disorders: aboutgerd.org Summary  When you have GERD, food and lifestyle choices are very important in easing your symptoms.  Eat small meals often instead of 3 large meals a day. Eat your meals slowly and in a place where you are relaxed.  Avoid bending over or lying down until 2-3 hours after eating.  Limit high-fat foods such as fatty meats or fried foods. This information is not intended to replace advice given to you by your health care provider. Make sure you discuss any questions you have with your health care provider. Document Revised: 07/02/2019 Document Reviewed: 07/02/2019 Elsevier Patient Education  2021 Elsevier Inc.  

## 2020-03-18 NOTE — Assessment & Plan Note (Signed)
Compliant with Zetia FLP and Bmet checked today. I will contact her with her results.

## 2020-03-18 NOTE — Assessment & Plan Note (Signed)
From ECHO done in 2019. G2DD. She denies new SOB or leg swelling. Monitor for now.

## 2020-03-18 NOTE — Assessment & Plan Note (Signed)
Benign GI exam. Continue daily Pepcid. Consider dose increase if no improvement. She agreed with the plan.

## 2020-03-18 NOTE — Progress Notes (Addendum)
    SUBJECTIVE:   CHIEF COMPLAINT / HPI:   DM2/HLD: Here for f/u. She is currently off meds. She received a recent eye exam with Dr. Otilio Jefferson. She is doing well otherwise. On Zetia for her Cholesterol.  Reflux: She takes Pecid 10 mg BID prn. She has needed to take it daily in the past few week for burning epigastric pain. No other GI concerns.  Hand:  She c/o having her left 4th and 5th fingers drop below the central axis, associated with pain. This occurs whenever she is using her hands, such as dishwashing. Symptoms improve after she massages her hand. She denies any swelling. This started >6 months ago but becoming more frequent, at least twice a week.  Other chronic problems address: CHF, TIA, COPD  PERTINENT  PMH / PSH: PMX reviewed.  OBJECTIVE:   BP 114/64   Pulse 71   Ht 5' (1.524 m)   Wt 151 lb 6 oz (68.7 kg)   LMP 12/27/2011   SpO2 99%   BMI 29.56 kg/m   Physical Exam Vitals and nursing note reviewed.  Cardiovascular:     Rate and Rhythm: Normal rate and regular rhythm.     Heart sounds: Normal heart sounds. No murmur heard.   Pulmonary:     Effort: Pulmonary effort is normal. No respiratory distress.     Breath sounds: Normal breath sounds. No wheezing.  Abdominal:     General: Abdomen is flat. Bowel sounds are normal. There is no distension.     Palpations: There is no mass.     Tenderness: There is no abdominal tenderness.  Musculoskeletal:     Right hand: Normal.     Left hand: Normal.     Right lower leg: No edema.     Left lower leg: No edema.     Comments: Sensory exam of the foot is normal, tested with the monofilament. Good pulses, no  ulcers, good peripheral pulses.  + Left foot calluses on the central aspect       ASSESSMENT/PLAN:   Diabetes mellitus A1C increased a bit to 6.4 Monitor closely. Repeat in 3-6 months. Release of Inf form completed for her DM eye exam.  Hyperlipidemia associated with type 2 diabetes mellitus  ([place]) Compliant with Zetia FLP and Bmet checked today. I will contact her with her results.  GERD (gastroesophageal reflux disease) Benign GI exam. Continue daily Pepcid. Consider dose increase if no improvement. She agreed with the plan.  Transient cerebral ischemia Continue daily ASA. If GI bleed or worsening GERD, may consider holding it.   Diastolic CHF ([place]) From ECHO done in 2019. G2DD. She denies new SOB or leg swelling. Monitor for now.  COPD (chronic obstructive pulmonary disease) (HCC) Stable on current regimen.   Left finger symptoms Etiology unclear. ?? Triggered finger. Hand/finger exam benign. Xray and referral to PT and Hand specialist offered. She opted to monitor for now.  NB: She declined COVID-19 shot today.  Andrena Mews, MD [place]

## 2020-03-18 NOTE — Assessment & Plan Note (Signed)
Stable on current regimen   

## 2020-03-19 ENCOUNTER — Telehealth: Payer: Self-pay | Admitting: Family Medicine

## 2020-03-19 LAB — LIPID PANEL
Chol/HDL Ratio: 3.6 ratio (ref 0.0–4.4)
Cholesterol, Total: 192 mg/dL (ref 100–199)
HDL: 53 mg/dL (ref >39)
LDL Chol Calc (NIH): 121 mg/dL — ABNORMAL HIGH (ref 0–99)
Triglycerides: 99 mg/dL (ref 0–149)
VLDL Cholesterol Cal: 18 mg/dL (ref 5–40)

## 2020-03-19 LAB — BASIC METABOLIC PANEL
BUN/Creatinine Ratio: 13 (ref 9–23)
BUN: 8 mg/dL (ref 6–24)
CO2: 22 mmol/L (ref 20–29)
Calcium: 9.5 mg/dL (ref 8.7–10.2)
Chloride: 102 mmol/L (ref 96–106)
Creatinine, Ser: 0.64 mg/dL (ref 0.57–1.00)
Glucose: 84 mg/dL (ref 65–99)
Potassium: 4.3 mmol/L (ref 3.5–5.2)
Sodium: 140 mmol/L (ref 134–144)
eGFR: 102 mL/min/{1.73_m2} (ref >59)

## 2020-03-19 NOTE — Telephone Encounter (Signed)
Patient LVM on nurse line returning call to PCP  Talbot Grumbling, RN

## 2020-03-19 NOTE — Telephone Encounter (Signed)
HIPAA compliant callback message left. 

## 2020-03-19 NOTE — Telephone Encounter (Signed)
I called and discussed her FLP report with her. LDL elevated despite compliance with Zetia. Option given to add on Welchol vs diet control with current regimen and repeat test in 6 months.  She opted for Zetia with diet control and repeat test in 6 months. If still elevated, we will add on Welchol or Cholestyramine.

## 2020-03-27 ENCOUNTER — Encounter: Payer: Self-pay | Admitting: Family Medicine

## 2020-04-07 ENCOUNTER — Ambulatory Visit
Admission: RE | Admit: 2020-04-07 | Discharge: 2020-04-07 | Disposition: A | Discharge status: Home / Self Care | Payer: Medicare Other | Source: Ambulatory Visit | Attending: Family Medicine | Admitting: Family Medicine

## 2020-04-07 ENCOUNTER — Other Ambulatory Visit: Payer: Self-pay

## 2020-04-07 DIAGNOSIS — Z1231 Encounter for screening mammogram for malignant neoplasm of breast: Secondary | ICD-10-CM

## 2020-05-25 ENCOUNTER — Other Ambulatory Visit: Payer: Self-pay | Admitting: Family Medicine

## 2020-07-22 ENCOUNTER — Ambulatory Visit (HOSPITAL_COMMUNITY)
Admission: RE | Admit: 2020-07-22 | Discharge: 2020-07-22 | Disposition: A | Discharge status: Home / Self Care | Payer: Medicare Other | Source: Ambulatory Visit | Attending: Family Medicine | Admitting: Family Medicine

## 2020-07-22 ENCOUNTER — Encounter: Payer: Self-pay | Admitting: Family Medicine

## 2020-07-22 ENCOUNTER — Other Ambulatory Visit: Payer: Self-pay

## 2020-07-22 ENCOUNTER — Ambulatory Visit (INDEPENDENT_AMBULATORY_CARE_PROVIDER_SITE_OTHER): Payer: Medicare Other | Admitting: Family Medicine

## 2020-07-22 VITALS — BP 122/65 | HR 81 | Ht 60.0 in | Wt 146.0 lb

## 2020-07-22 DIAGNOSIS — E785 Hyperlipidemia, unspecified: Secondary | ICD-10-CM | POA: Diagnosis not present

## 2020-07-22 DIAGNOSIS — E1169 Type 2 diabetes mellitus with other specified complication: Secondary | ICD-10-CM

## 2020-07-22 DIAGNOSIS — M25512 Pain in left shoulder: Secondary | ICD-10-CM | POA: Insufficient documentation

## 2020-07-22 DIAGNOSIS — J449 Chronic obstructive pulmonary disease, unspecified: Secondary | ICD-10-CM | POA: Diagnosis not present

## 2020-07-22 DIAGNOSIS — I5030 Unspecified diastolic (congestive) heart failure: Secondary | ICD-10-CM

## 2020-07-22 DIAGNOSIS — G8929 Other chronic pain: Secondary | ICD-10-CM

## 2020-07-22 DIAGNOSIS — F17200 Nicotine dependence, unspecified, uncomplicated: Secondary | ICD-10-CM

## 2020-07-22 DIAGNOSIS — Z122 Encounter for screening for malignant neoplasm of respiratory organs: Secondary | ICD-10-CM

## 2020-07-22 DIAGNOSIS — M19012 Primary osteoarthritis, left shoulder: Secondary | ICD-10-CM | POA: Diagnosis not present

## 2020-07-22 LAB — POCT GLYCOSYLATED HEMOGLOBIN (HGB A1C): HbA1c, POC (controlled diabetic range): 6.3 % (ref 0.0–7.0)

## 2020-07-22 MED ORDER — DICLOFENAC SODIUM 1 % EX GEL: 2.0000 g | Freq: Four times a day (QID) | CUTANEOUS | 1 refills | Status: DC

## 2020-07-22 NOTE — Assessment & Plan Note (Signed)
Smoking cessation discussed. CT lung cancer screen ordered.

## 2020-07-22 NOTE — Assessment & Plan Note (Signed)
No acute findings.

## 2020-07-22 NOTE — Assessment & Plan Note (Signed)
A1C looks good. Weight looks good. Continue diet and exercise.

## 2020-07-22 NOTE — Patient Instructions (Signed)
American Journal of Respiratory and [place], [phone]), e5-e31. http://knight.com/.638937-3428JG">  Health Risks of Smoking Smoking tobacco is very bad for your health. Tobacco smoke contains many toxic chemicals that can damage every part of your body. Secondhand smoke can be harmful to those around you. Tobacco or nicotine use can cause many long-term (chronic) diseases. Smoking is difficult to quit because a chemical in tobacco, called nicotine, causes addiction or dependence. When you smoke and inhale, nicotine is absorbed quickly into the bloodstream through your lungs. Both inhaled and non-inhalednicotine may be addictive. How can quitting affect me? There are health benefits of quitting smoking. Some benefits happen right away and others take time. Benefits may include: Blood flow, blood pressure, heart rate, and lung capacity may begin to improve. However, any lung damage that has already occurred cannot be repaired. Temporary respiratory symptoms, such as nasal congestion and cough, may improve over time. Your risk of heart disease, stroke, and cancer is reduced. The overall quality of your health may improve. You may save money, as you will not spend money on tobacco products and may spend less money on smoking-related health issues. What can increase my risk? Smoking harms nearly every organ in the body. People who smoke tobacco have a shorter life expectancy and an increased risk of many serious medical problems. These include: More respiratory infections, such as colds and pneumonia. Cancer. Heart disease. Stroke. Chronic respiratory diseases. Delayed wound healing and increased risk of complications during surgery. Problems with reproduction, pregnancy, and childbirth, such as infertility, early (premature) births, stillbirths, and birth defects. Secondhand smoke exposure to children increases the risk of: Sudden infant death syndrome (SIDS). Infections in  the nose, throat, or airways (respiratory infections). Chronic respiratory symptoms. What actions can I take to quit? Smoking is an addiction that affects both your body and your mind, and long-time habits can be hard to change. Your health care provider can recommend: Nicotine replacement products, such as patches, gum, and nasal sprays. Use these products only as directed. Do not replace cigarette smoking with electronic cigarettes, which are commonly called e-cigarettes. The safety of e-cigarettes is not known, and some may contain harmful chemicals. Programs and community resources, which may include group support, education, or talk therapy. Prescription medicines to help reduce cravings. A combination of two or more quit methods, which will increase the success of quitting. Where to find support Follow the recommendations from your health care provider about support groups and other assistance. You can also visit: [employer]: www.naquitline.org or call 1-800-QUIT-NOW. U.S. Department of Health and Human Services: www.smokefree.gov American Lung Association: www.freedomfromsmoking.org American Heart Association: www.heart.org Where to find more information Centers for Disease Control and Prevention: http://www.wolf.info/ World Health Organization: RoleLink.com.br Summary Smoking tobacco is very bad for your health. Tobacco smoke contains many toxic chemicals that can damage every part of the body. Smoking is difficult to quit because a chemical in tobacco, called nicotine, causes addiction or dependence. There are immediate and long-term health benefits of quitting smoking. A combination of two or more quit methods increases the success of quitting. This information is not intended to replace advice given to you by your health care provider. Make sure you discuss any questions you have with your healthcare provider. Document Revised: 02/05/2019 Document Reviewed:  02/05/2019 Elsevier Patient Education  2022 [employer].

## 2020-07-22 NOTE — Assessment & Plan Note (Signed)
Stable. Smoking cessation discussed. CT lung cancer screen ordered.

## 2020-07-22 NOTE — Progress Notes (Addendum)
    SUBJECTIVE:   CHIEF COMPLAINT / HPI:   Shoulder Pain  The pain is present in the left shoulder. This is a chronic problem. The current episode started more than 1 month ago (Started 3 month). There has been no history of extremity trauma. The problem occurs constantly. The problem has been waxing and waning. The quality of the pain is described as aching. Pain scale: Sometimes 10/10 in severity with movement, but no pain when she is resting. Pain wakes her up at night sometimes. Associated symptoms include a limited range of motion and stiffness. Pertinent negatives include no joint locking, joint swelling or numbness. The symptoms are aggravated by activity. She has tried heat and NSAIDS (400 mg Ibuprofen) for the symptoms. The treatment provided mild relief.   DM2; Diet control. She is here for f/u.  COPD/CHF/Smoking: Smokes tobacco since age 75. Been smoking 1 PPD for many years. She has chronic COPD/smokers cough which has not changed from baseline. No new concerns.  HM: Due for Shingrix and PCV 20.  PERTINENT  PMH / PSH: PMX reviewed  OBJECTIVE:   BP 122/65   Pulse 81   Ht 5' (1.524 m)   Wt 146 lb (66.2 kg)   LMP 12/27/2011   SpO2 92%   BMI 28.51 kg/m   Physical Exam Vitals and nursing note reviewed.  Cardiovascular:     Rate and Rhythm: Normal rate and regular rhythm.     Pulses: Normal pulses.     Heart sounds: Normal heart sounds. No murmur heard. Pulmonary:     Effort: Pulmonary effort is normal. No respiratory distress.     Breath sounds: Normal breath sounds. No wheezing.  Musculoskeletal:     Right shoulder: Normal.     Left shoulder: Tenderness present. No swelling, deformity or crepitus. Decreased range of motion.     Right lower leg: No edema.     Left lower leg: No edema.     ASSESSMENT/PLAN:  Left shoulder pain: ?? OA vs frozen shoulder Shoulder xray obtained. PT referral pending xray report. Voltaren gel escribed. May alternate with Ibuprofen  prn.  Hyperlipidemia associated with type 2 diabetes mellitus (HCC) A1C looks good. Weight looks good. Continue diet and exercise.  Diabetes mellitus A1C looks good. Weight looks good. Continue diet and exercise.  Diastolic CHF ([place]) No acute findings.  COPD (chronic obstructive pulmonary disease) (HCC) Stable. Smoking cessation discussed. CT lung cancer screen ordered.  She declined both PCV and Shingrix. She stated she will not get those vaccinations since she does not know what's in them.  Andrena Mews, MD [place]

## 2020-07-23 ENCOUNTER — Telehealth: Payer: Self-pay | Admitting: Family Medicine

## 2020-07-23 NOTE — Telephone Encounter (Signed)
HIPAA compliant callback message left.  NB:  Xray shows moderate arthritis. Use Voltaren gel as prescribed and alternate with Tylenol as needed.  I will like to proceed with PT referral if she agrees, please, confirm with her when she calls and let me know if she agrees with PT referral.  Consider shoulder injection in the future.

## 2020-07-23 NOTE — Telephone Encounter (Signed)
Patient returned call to nurse line. Informed of below. Patient does request to proceed with PT referral, as long as it is covered by insurance.   Talbot Grumbling, RN

## 2020-07-24 ENCOUNTER — Other Ambulatory Visit: Payer: Self-pay | Admitting: Family Medicine

## 2020-07-24 DIAGNOSIS — G8929 Other chronic pain: Secondary | ICD-10-CM

## 2020-07-24 DIAGNOSIS — M25512 Pain in left shoulder: Secondary | ICD-10-CM

## 2020-07-25 NOTE — Telephone Encounter (Signed)
Spoke with patient. Informed her on note left by Dr. [name]. Patient understood and stated that she will call her insurance company to see which PT facility will take her insurance. Salvatore Marvel, CMA

## 2020-07-29 ENCOUNTER — Telehealth: Payer: Self-pay

## 2020-07-29 ENCOUNTER — Ambulatory Visit (HOSPITAL_COMMUNITY)
Admission: RE | Admit: 2020-07-29 | Discharge: 2020-07-29 | Disposition: A | Discharge status: Home / Self Care | Payer: Medicare Other | Source: Ambulatory Visit | Attending: Family Medicine | Admitting: Family Medicine

## 2020-07-29 ENCOUNTER — Other Ambulatory Visit: Payer: Self-pay

## 2020-07-29 DIAGNOSIS — Z122 Encounter for screening for malignant neoplasm of respiratory organs: Secondary | ICD-10-CM | POA: Insufficient documentation

## 2020-07-29 DIAGNOSIS — F17210 Nicotine dependence, cigarettes, uncomplicated: Secondary | ICD-10-CM | POA: Diagnosis not present

## 2020-07-29 NOTE — Telephone Encounter (Signed)
Referral faxed.  Obadiah Dennard,CMA  

## 2020-07-29 NOTE — Telephone Encounter (Signed)
Patient calls nurse line requesting referral to [facility] therapy. Phone number for [facility] is [phone].   Talbot Grumbling, RN

## 2020-07-31 ENCOUNTER — Telehealth: Payer: Self-pay | Admitting: Family Medicine

## 2020-07-31 NOTE — Telephone Encounter (Signed)
HIPAA compliant callback message left.   Please advise her that her CT chest is negative for cancer. She has plaque in som blood vessel, which could be due to cholesterol. We already have her on cholesterol medication. Repeat CT scan in 1 year. Follow-up soon if she has additional questions.

## 2020-07-31 NOTE — Telephone Encounter (Signed)
Patient returns call to nurse line. Informed of results per provider note. Patient has no additional questions at this time.  Talbot Grumbling, RN

## 2020-08-04 ENCOUNTER — Ambulatory Visit: Payer: Medicare Other | Admitting: Physical Therapy

## 2020-08-04 DIAGNOSIS — M25612 Stiffness of left shoulder, not elsewhere classified: Secondary | ICD-10-CM | POA: Diagnosis not present

## 2020-08-04 DIAGNOSIS — R531 Weakness: Secondary | ICD-10-CM | POA: Diagnosis not present

## 2020-08-04 DIAGNOSIS — M25512 Pain in left shoulder: Secondary | ICD-10-CM | POA: Diagnosis not present

## 2020-08-06 DIAGNOSIS — M25512 Pain in left shoulder: Secondary | ICD-10-CM | POA: Diagnosis not present

## 2020-08-06 DIAGNOSIS — R531 Weakness: Secondary | ICD-10-CM | POA: Diagnosis not present

## 2020-08-06 DIAGNOSIS — M25612 Stiffness of left shoulder, not elsewhere classified: Secondary | ICD-10-CM | POA: Diagnosis not present

## 2020-08-11 DIAGNOSIS — M25512 Pain in left shoulder: Secondary | ICD-10-CM | POA: Diagnosis not present

## 2020-08-11 DIAGNOSIS — R531 Weakness: Secondary | ICD-10-CM | POA: Diagnosis not present

## 2020-08-11 DIAGNOSIS — M25612 Stiffness of left shoulder, not elsewhere classified: Secondary | ICD-10-CM | POA: Diagnosis not present

## 2020-08-13 DIAGNOSIS — M25512 Pain in left shoulder: Secondary | ICD-10-CM | POA: Diagnosis not present

## 2020-08-13 DIAGNOSIS — M25612 Stiffness of left shoulder, not elsewhere classified: Secondary | ICD-10-CM | POA: Diagnosis not present

## 2020-08-13 DIAGNOSIS — R531 Weakness: Secondary | ICD-10-CM | POA: Diagnosis not present

## 2020-08-18 DIAGNOSIS — M25612 Stiffness of left shoulder, not elsewhere classified: Secondary | ICD-10-CM | POA: Diagnosis not present

## 2020-08-18 DIAGNOSIS — M25512 Pain in left shoulder: Secondary | ICD-10-CM | POA: Diagnosis not present

## 2020-08-18 DIAGNOSIS — R531 Weakness: Secondary | ICD-10-CM | POA: Diagnosis not present

## 2020-08-20 DIAGNOSIS — M25612 Stiffness of left shoulder, not elsewhere classified: Secondary | ICD-10-CM | POA: Diagnosis not present

## 2020-08-20 DIAGNOSIS — R531 Weakness: Secondary | ICD-10-CM | POA: Diagnosis not present

## 2020-08-20 DIAGNOSIS — M25512 Pain in left shoulder: Secondary | ICD-10-CM | POA: Diagnosis not present

## 2020-08-25 DIAGNOSIS — R531 Weakness: Secondary | ICD-10-CM | POA: Diagnosis not present

## 2020-08-25 DIAGNOSIS — M25512 Pain in left shoulder: Secondary | ICD-10-CM | POA: Diagnosis not present

## 2020-08-25 DIAGNOSIS — M25612 Stiffness of left shoulder, not elsewhere classified: Secondary | ICD-10-CM | POA: Diagnosis not present

## 2020-08-27 DIAGNOSIS — M25512 Pain in left shoulder: Secondary | ICD-10-CM | POA: Diagnosis not present

## 2020-08-27 DIAGNOSIS — R531 Weakness: Secondary | ICD-10-CM | POA: Diagnosis not present

## 2020-08-27 DIAGNOSIS — M25612 Stiffness of left shoulder, not elsewhere classified: Secondary | ICD-10-CM | POA: Diagnosis not present

## 2020-09-01 DIAGNOSIS — M25512 Pain in left shoulder: Secondary | ICD-10-CM | POA: Diagnosis not present

## 2020-09-01 DIAGNOSIS — M25612 Stiffness of left shoulder, not elsewhere classified: Secondary | ICD-10-CM | POA: Diagnosis not present

## 2020-09-01 DIAGNOSIS — R531 Weakness: Secondary | ICD-10-CM | POA: Diagnosis not present

## 2020-09-03 DIAGNOSIS — R531 Weakness: Secondary | ICD-10-CM | POA: Diagnosis not present

## 2020-09-03 DIAGNOSIS — M25512 Pain in left shoulder: Secondary | ICD-10-CM | POA: Diagnosis not present

## 2020-09-03 DIAGNOSIS — M25612 Stiffness of left shoulder, not elsewhere classified: Secondary | ICD-10-CM | POA: Diagnosis not present

## 2020-09-10 DIAGNOSIS — R531 Weakness: Secondary | ICD-10-CM | POA: Diagnosis not present

## 2020-09-10 DIAGNOSIS — M25512 Pain in left shoulder: Secondary | ICD-10-CM | POA: Diagnosis not present

## 2020-09-10 DIAGNOSIS — M25612 Stiffness of left shoulder, not elsewhere classified: Secondary | ICD-10-CM | POA: Diagnosis not present

## 2020-09-15 ENCOUNTER — Telehealth: Payer: Self-pay

## 2020-09-15 DIAGNOSIS — M25512 Pain in left shoulder: Secondary | ICD-10-CM | POA: Diagnosis not present

## 2020-09-15 DIAGNOSIS — R531 Weakness: Secondary | ICD-10-CM | POA: Diagnosis not present

## 2020-09-15 DIAGNOSIS — M25612 Stiffness of left shoulder, not elsewhere classified: Secondary | ICD-10-CM | POA: Diagnosis not present

## 2020-09-15 NOTE — Telephone Encounter (Signed)
LVM to have pt call back to schedule AWV.   RE: confirm insurance and schedule AWV on my schedule if times are convenient for patient or other AWV schedule as template permits.   

## 2020-09-17 DIAGNOSIS — M25512 Pain in left shoulder: Secondary | ICD-10-CM | POA: Diagnosis not present

## 2020-09-17 DIAGNOSIS — R531 Weakness: Secondary | ICD-10-CM | POA: Diagnosis not present

## 2020-09-17 DIAGNOSIS — M25612 Stiffness of left shoulder, not elsewhere classified: Secondary | ICD-10-CM | POA: Diagnosis not present

## 2020-09-22 DIAGNOSIS — M25512 Pain in left shoulder: Secondary | ICD-10-CM | POA: Diagnosis not present

## 2020-09-22 DIAGNOSIS — M25612 Stiffness of left shoulder, not elsewhere classified: Secondary | ICD-10-CM | POA: Diagnosis not present

## 2020-09-22 DIAGNOSIS — R531 Weakness: Secondary | ICD-10-CM | POA: Diagnosis not present

## 2020-09-24 DIAGNOSIS — R531 Weakness: Secondary | ICD-10-CM | POA: Diagnosis not present

## 2020-09-24 DIAGNOSIS — M25612 Stiffness of left shoulder, not elsewhere classified: Secondary | ICD-10-CM | POA: Diagnosis not present

## 2020-09-24 DIAGNOSIS — M25512 Pain in left shoulder: Secondary | ICD-10-CM | POA: Diagnosis not present

## 2020-09-29 DIAGNOSIS — M25612 Stiffness of left shoulder, not elsewhere classified: Secondary | ICD-10-CM | POA: Diagnosis not present

## 2020-09-29 DIAGNOSIS — R531 Weakness: Secondary | ICD-10-CM | POA: Diagnosis not present

## 2020-09-29 DIAGNOSIS — M25512 Pain in left shoulder: Secondary | ICD-10-CM | POA: Diagnosis not present

## 2020-09-30 ENCOUNTER — Ambulatory Visit (INDEPENDENT_AMBULATORY_CARE_PROVIDER_SITE_OTHER): Payer: Medicare Other

## 2020-09-30 ENCOUNTER — Other Ambulatory Visit: Payer: Self-pay

## 2020-09-30 VITALS — BP 132/78 | HR 93 | Ht 59.0 in | Wt 148.0 lb

## 2020-09-30 DIAGNOSIS — Z0000 Encounter for general adult medical examination without abnormal findings: Secondary | ICD-10-CM

## 2020-09-30 NOTE — Progress Notes (Signed)
Subjective:   Betty Chapman is a 60 y.o. female who presents for Medicare Annual (Subsequent) preventive examination.  Review of Systems: Defer to PCP.  Cardiac Risk Factors include: smoking/ tobacco exposure  Objective:   Vitals: BP 132/78   Pulse 93   Ht _0  (1.499 m)   Wt 148 lb (67.1 kg)   LMP 12/27/2011   SpO2 97%   BMI 29.89 kg/m   Body mass index is 29.89 kg/m.  Advanced Directives 09/30/2020 07/22/2020 03/18/2020 10/05/2019 04/24/2019 03/23/2019 02/06/2019  Does Patient Have a Medical Advance Directive? _1  No No  Does patient want to make changes to medical advance directive? - - - - - - -  Would patient like information on creating a medical advance directive? Yes (MAU/Ambulatory/Procedural Areas - Information given) - No - Patient declined No - Patient declined No - Patient declined No - Patient declined No - Patient declined  Pre-existing out of facility DNR order (yellow form or pink MOST form) - - - - - - -   Tobacco Social History   Tobacco Use  Smoking Status Every Day   Packs/day: 1.00   Years: 47.00   Pack years: 47.00   Types: Cigarettes   Start date: 01/04/1970  Smokeless Tobacco Never  Tobacco Comments   1 pack per day max      Ready to quit: No  Counseling given: Yes Max 1ppd  Clinical Intake:  Pre-visit preparation completed: Yes  Pain Score: 1   How often do you need to have someone help you when you read instructions, pamphlets, or other written materials from your doctor or pharmacy?: 2 - Rarely What is the last grade level you completed in school?: High School  Interpreter Needed?: No  Past Medical History:  Diagnosis Date   Blurry vision, bilateral 03/20/2014   Callus of foot 07/16/2014   COPD (chronic obstructive pulmonary disease) ([place])    COVID-19 virus infection    Dysphagia, pharyngoesophageal phase    Epidermal cyst of face 07/16/2014   Hypercholesterolemia    Hypertension    TIA (transient ischemic attack)     Once   Tobacco abuse    Unintended weight loss 02/06/2019   Past Surgical History:  Procedure Laterality Date   BTL     CHOLECYSTECTOMY     COLONOSCOPY N/A 06/10/2014   Procedure: COLONOSCOPY;  Surgeon: Daneil Dolin, MD;  Location: [facility];  Service: Endoscopy;  Laterality: N/A;  1315   ECTOPIC PREGNANCY SURGERY     ESOPHAGOGASTRODUODENOSCOPY N/A 06/10/2014   Procedure: ESOPHAGOGASTRODUODENOSCOPY (EGD);  Surgeon: Daneil Dolin, MD;  Location: [facility];  Service: Endoscopy;  Laterality: N/A;   MALONEY DILATION N/A 06/10/2014   Procedure: Venia Minks DILATION;  Surgeon: Daneil Dolin, MD;  Location: [facility];  Service: Endoscopy;  Laterality: N/A;   TUBAL LIGATION     Family History  Problem Relation Age of Onset   Aneurysm Mother        brain, cause of death   Cancer Brother        Dies of cancer, unsure what primary site   Prostate cancer Brother    Hypertension Daughter    Diabetes Daughter    Colon cancer Neg Hx    Breast cancer Neg Hx    Social History   Socioeconomic History   Marital status: Divorced    Spouse name: Not on file   Number of children: 2   Years of  education: 12   Highest education level: 12th grade  Occupational History   Not on file  Tobacco Use   Smoking status: Every Day    Packs/day: 1.00    Years: 47.00    Pack years: 47.00    Types: Cigarettes    Start date: 01/04/1970   Smokeless tobacco: Never   Tobacco comments:    1 pack per day max   Vaping Use   Vaping Use: Never used  Substance and Sexual Activity   Alcohol use: Yes    Alcohol/week: 0.0 standard drinks    Comment: rarely    Drug use: Yes    Types: Marijuana    Comment: Occasionally   Sexual activity: Not Currently    Birth control/protection: Surgical  Other Topics Concern   Not on file  Social History Narrative   Patient lives with her brother and two dogs.    Patient is divorced and has 2 children.    Patient enjoys gardening and playing with her dogs.     Social Determinants of Health   Financial Resource Strain: Low Risk    Difficulty of Paying Living Expenses: Not hard at all  Food Insecurity: No Food Insecurity   Worried About Charity fundraiser in the Last Year: Never true   [place] in the Last Year: Never true  Transportation Needs: No Transportation Needs   Lack of Transportation (Medical): No   Lack of Transportation (Non-Medical): No  Physical Activity: Inactive   Days of Exercise per Week: 0 days   Minutes of Exercise per Session: 0 min  Stress: No Stress Concern Present   Feeling of Stress : Only a little  Social Connections: Socially Isolated   Frequency of Communication with Friends and Family: Three times a week   Frequency of Social Gatherings with Friends and Family: Twice a week   Attends Religious Services: Never   Marine scientist or Organizations: No   Attends Archivist Meetings: Never   Marital Status: Divorced   Outpatient Encounter Medications as of 09/30/2020  Medication Sig   albuterol (PROVENTIL) (2.5 MG/3ML) 0.083% nebulizer solution Take 3 mLs (2.5 mg total) by nebulization every 6 (six) hours as needed for wheezing or shortness of breath.   albuterol (VENTOLIN HFA) 108 (90 Base) MCG/ACT inhaler USE 2 INHALATIONS BY MOUTH  EVERY 6 HOURS AS NEEDED FOR WHEEZING OR SHORTNESS OF  BREATH   aspirin 81 MG tablet Take 1 tablet (81 mg total) by mouth daily.   DULERA 200-5 MCG/ACT AERO USE 2 INHALATIONS BY MOUTH  TWICE DAILY   ezetimibe (ZETIA) 10 MG tablet Take 1 tablet (10 mg total) by mouth daily.   famotidine (PEPCID) 10 MG tablet Take 10 mg by mouth 2 (two) times daily.   ibuprofen (ADVIL) 400 MG tablet Take 1 tablet (400 mg total) by mouth every 8 (eight) hours as needed for moderate pain.   lisinopril (ZESTRIL) 20 MG tablet TAKE 1 TABLET BY MOUTH  DAILY   Omega-3 Fatty Acids (FISH OIL) 1000 MG CAPS Take 1 capsule by mouth 2 (two) times daily.   SPIRIVA HANDIHALER 18 MCG inhalation  capsule INHALE THE CONTENTS OF 1  CAPSULE BY MOUTH VIA  HANDIHALER DAILY   Turmeric 500 MG CAPS Take by mouth.   Accu-Chek Softclix Lancets lancets TEST ONCE DAILY AS DIRECTED   baclofen (LIORESAL) 10 MG tablet Take 1 tablet (10 mg total) by mouth 2 (two) times daily as needed for muscle  spasms. (Patient not taking: No sig reported)   blood glucose meter kit and supplies KIT Accu-Chek Aviva Plus Use up to TID daily as directed. (FOR ICD-9 250.00, 250.01). (Patient not taking: Reported on 09/30/2020)   diclofenac Sodium (VOLTAREN) 1 % GEL Apply 2 g topically 4 (four) times daily. Apply to left shoulder   glucose blood (ACCU-CHEK AVIVA PLUS) test strip TEST UP TO ONCE DAILY AS DIRECTED   hydrOXYzine (ATARAX/VISTARIL) 10 MG tablet Take 1 tablet (10 mg total) by mouth 3 (three) times daily as needed for itching. (Patient not taking: No sig reported)   triamcinolone ointment (KENALOG) 0.5 % Apply 1 application topically 2 (two) times daily. (Patient not taking: No sig reported)   No facility-administered encounter medications on file as of 09/30/2020.   Activities of Daily Living In your present state of health, do you have any difficulty performing the following activities: 09/30/2020  Hearing? N  Vision? N  Difficulty concentrating or making decisions? N  Walking or climbing stairs? Y  Comment COPD  Dressing or bathing? N  Doing errands, shopping? N  Preparing Food and eating ? N  Using the Toilet? N  In the past six months, have you accidently leaked urine? Y  Comment wears pad  Do you have problems with loss of bowel control? N  Managing your Medications? N  Managing your Finances? N  Housekeeping or managing your Housekeeping? N  Some recent data might be hidden   Patient Care Team: [name], MD as PCP - General (Family Medicine) Gala Romney Cristopher Estimable, MD as Consulting Physician (Gastroenterology)    Assessment:   This is a routine wellness examination for [facility].  Exercise  Activities and Dietary recommendations Current Exercise Habits: The patient does not participate in regular exercise at present, Exercise limited by: respiratory conditions(s);orthopedic condition(s)   Goals      Quit Smoking       Fall Risk Fall Risk  09/30/2020 03/23/2019 02/06/2019 08/11/2018 12/05/2017  Falls in the past year? 0 0 0 0 0  Number falls in past yr: - - 0 - 0  Injury with Fall? - - - - 0  Risk for fall due to : No Fall Risks - - - -  Follow up Falls prevention discussed - - - -   Patient has normal gait and balance. Patient does not use assistive devices to ambulate.   Is the patient's home free of loose throw rugs in walkways, pet beds, electrical cords, etc?   yes      Grab bars in the bathroom? yes      Handrails on the stairs?   yes      Adequate lighting?   yes  Patient rating of health (0-10) scale: 5  Depression Screen PHQ 2/9 Scores 09/30/2020 07/22/2020 03/18/2020 10/05/2019  PHQ - 2 Score 0 0 1 0  PHQ- 9 Score - 6 7 0  Exception Documentation - - - -    Cognitive Function  6CIT Screen 09/30/2020  What Year? 0 points  What month? 0 points  What time? 0 points  Count back from 20 0 points  Months in reverse 0 points  Repeat phrase 0 points  Total Score 0   Immunization History  Administered Date(s) Administered   Influenza,inj,Quad PF,6+ Mos 11/10/2017, 09/01/2018   Pneumococcal Polysaccharide-23 11/10/2017   Td 01/04/2001   Tdap 07/04/2018   Qualifies for Shingles Vaccine? Yes   Zostavax completed No   Shingrix Completed?: No.  Education has been provided regarding the importance of this vaccine. Patient has been advised to call insurance company to determine out of pocket expense if they have not yet received this vaccine. Advised may also receive vaccine at local pharmacy or Health Dept. Verbalized acceptance and understanding.  Screening Tests Health Maintenance  Topic Date Due   Zoster Vaccines- Shingrix (1 of 2) 10/22/2020 (Originally  04/20/2010)   COVID-19 Vaccine (1) 12/04/2020 (Originally 10/19/1960)   INFLUENZA VACCINE  04/03/2021 (Originally 08/04/2020)   HEMOGLOBIN A1C  01/22/2021   OPHTHALMOLOGY EXAM  02/21/2021   FOOT EXAM  03/18/2021   MAMMOGRAM  04/08/2022   PAP SMEAR-Modifier  04/23/2024   COLONOSCOPY (Pts 45-47yr Insurance coverage will need to be confirmed)  06/09/2024   TETANUS/TDAP  07/03/2028   Hepatitis C Screening  Completed   HIV Screening  Completed   HPV VACCINES  Aged Out   Cancer Screenings: Lung: Low Dose CT Chest recommended if Age 60-80years, 30 pack-year currently smoking OR have quit w/in 15years. Patient does qualify. Completed 07/30/2020- followup in one year. Breast:  Up to date on Mammogram? Yes   Up to date of Bone Density/Dexa? NA Colorectal: UTD Pap Smear: UTD  Additional Screenings: Hepatitis C Screening: Completed  HIV Screening: Completed   Plan:  PCP apt scheduled for 10/14/2020. Consider Flu Vaccine at visit.  Fill out advance directive packet and bring back to our office.  I have personally reviewed and noted the following in the patient's chart:   Medical and social history Use of alcohol, tobacco or illicit drugs  Current medications and supplements Functional ability and status Nutritional status Physical activity Advanced directives List of other physicians Hospitalizations, surgeries, and ER visits in previous 12 months Vitals Screenings to include cognitive, depression, and falls Referrals and appointments  In addition, I have reviewed and discussed with patient certain preventive protocols, quality metrics, and best practice recommendations. A written personalized care plan for preventive services as well as general preventive health recommendations were provided to patient.  EDorna Bloom CKnox 10/01/2020

## 2020-10-01 DIAGNOSIS — M25612 Stiffness of left shoulder, not elsewhere classified: Secondary | ICD-10-CM | POA: Diagnosis not present

## 2020-10-01 DIAGNOSIS — M25512 Pain in left shoulder: Secondary | ICD-10-CM | POA: Diagnosis not present

## 2020-10-01 DIAGNOSIS — R531 Weakness: Secondary | ICD-10-CM | POA: Diagnosis not present

## 2020-10-01 NOTE — Patient Instructions (Addendum)
You spoke to Dorna Bloom, [place] for your annual wellness visit.  We discussed goals:   Goals      Quit Smoking       We also discussed recommended health maintenance. As discussed, you are due for: Health Maintenance  Topic Date Due   Zoster Vaccines- Shingrix (1 of 2) 10/22/2020 (Originally 04/20/2010)   COVID-19 Vaccine (1) 12/04/2020 (Originally 10/19/1960)   INFLUENZA VACCINE  04/03/2021 (Originally 08/04/2020)   HEMOGLOBIN A1C  01/22/2021   OPHTHALMOLOGY EXAM  02/21/2021   FOOT EXAM  03/18/2021   MAMMOGRAM  04/08/2022   PAP SMEAR-Modifier  04/23/2024   COLONOSCOPY (Pts 45-53yr Insurance coverage will need to be confirmed)  06/09/2024   TETANUS/TDAP  07/03/2028   Hepatitis C Screening  Completed   HIV Screening  Completed   HPV VACCINES  Aged Out   PCP apt scheduled for 10/14/2020. Consider Flu Vaccine at visit.  Fill out advance directive packet and bring back to our office.  We also discussed smoking cessation. 1-800-QUIT NOW Preventive Care [age]Years Old, Female Preventive care refers to lifestyle choices and visits with your health care provider that can promote health and wellness. This includes: A yearly physical exam. This is also called an annual wellness visit. Regular dental and eye exams. Immunizations. Screening for certain conditions. Healthy lifestyle choices, such as: Eating a healthy diet. Getting regular exercise. Not using drugs or products that contain nicotine and tobacco. Limiting alcohol use. What can I expect for my preventive care visit? Physical exam Your health care provider will check your: Height and weight. These may be used to calculate your BMI (body mass index). BMI is a measurement that tells if you are at a healthy weight. Heart rate and blood pressure. Body temperature. Skin for abnormal spots. Counseling Your health care provider may ask you questions about your: Past medical problems. Family's medical history. Alcohol,  tobacco, and drug use. Emotional well-being. Home life and relationship well-being. Sexual activity. Diet, exercise, and sleep habits. Work and work [employer] Access to firearms. Method of birth control. Menstrual cycle. Pregnancy history. What immunizations do I need? Vaccines are usually given at various ages, according to a schedule. Your health care provider will recommend vaccines for you based on your age, medical history, and lifestyle or other factors, such as travel or where you work. What tests do I need? Blood tests Lipid and cholesterol levels. These may be checked every 5 years, or more often if you are over 542years old. Hepatitis C test. Hepatitis B test. Screening Lung cancer screening. You may have this screening every year starting at age 2678if you have a 30-pack-year history of smoking and currently smoke or have quit within the past 15 years. Colorectal cancer screening. All adults should have this screening starting at age 2631and continuing until age 959 Your health care provider may recommend screening at age 7382if you are at increased risk. You will have tests every 1-10 years, depending on your results and the type of screening test. Diabetes screening. This is done by checking your blood sugar (glucose) after you have not eaten for a while (fasting). You may have this done every 1-3 years. Mammogram. This may be done every 1-2 years. Talk with your health care provider about when you should start having regular mammograms. This may depend on whether you have a family history of breast cancer. BRCA-related cancer screening. This may be done if you have a family history of breast, ovarian,  tubal, or peritoneal cancers. Pelvic exam and Pap test. This may be done every 3 years starting at age 4. Starting at age 71, this may be done every 5 years if you have a Pap test in combination with an HPV test. Other tests STD (sexually transmitted disease) testing, if  you are at risk. Bone density scan. This is done to screen for osteoporosis. You may have this scan if you are at high risk for osteoporosis. Talk with your health care provider about your test results, treatment options, and if necessary, the need for more tests. Follow these instructions at home: Eating and drinking  Eat a diet that includes fresh fruits and vegetables, whole grains, lean protein, and low-fat dairy products. Take vitamin and mineral supplements as recommended by your health care provider. Do not drink alcohol if: Your health care provider tells you not to drink. You are pregnant, may be pregnant, or are planning to become pregnant. If you drink alcohol: Limit how much you have to 0-1 drink a day. Be aware of how much alcohol is in your drink. In the [place] one drink equals one 12 oz bottle of beer (355 mL), one 5 oz glass of wine (148 mL), or one 1 oz glass of hard liquor (44 mL). Lifestyle Take daily care of your teeth and gums. Brush your teeth every morning and night with fluoride toothpaste. Floss one time each day. Stay active. Exercise for at least 30 minutes 5 or more days each week. Do not use any products that contain nicotine or tobacco, such as cigarettes, e-cigarettes, and chewing tobacco. If you need help quitting, ask your health care provider. Do not use drugs. If you are sexually active, practice safe sex. Use a condom or other form of protection to prevent STIs (sexually transmitted infections). If you do not wish to become pregnant, use a form of birth control. If you plan to become pregnant, see your health care provider for a prepregnancy visit. If told by your health care provider, take low-dose aspirin daily starting at age 28. Find healthy ways to cope with stress, such as: Meditation, yoga, or listening to music. Journaling. Talking to a trusted person. Spending time with friends and family. Safety Always wear your seat belt while driving or  riding in a vehicle. Do not drive: If you have been drinking alcohol. Do not ride with someone who has been drinking. When you are tired or distracted. While texting. Wear a helmet and other protective equipment during sports activities. If you have firearms in your house, make sure you follow all gun safety procedures. What's next? Visit your health care provider once a year for an annual wellness visit. Ask your health care provider how often you should have your eyes and teeth checked. Stay up to date on all vaccines. This information is not intended to replace advice given to you by your health care provider. Make sure you discuss any questions you have with your health care provider. Document Revised: 02/29/2020 Document Reviewed: 09/01/2017 Elsevier Patient Education  2022 [place].  Smoking Tobacco Information, Adult Smoking tobacco can be harmful to your health. Tobacco contains a poisonous (toxic), colorless chemical called nicotine. Nicotine is addictive. It changes the brain and can make it hard to stop smoking. Tobacco also has other toxic chemicals that can hurt your body and raise your risk of many cancers. How can smoking tobacco affect me? Smoking tobacco puts you at risk for: Cancer. Smoking is most commonly associated with  lung cancer, but can also lead to cancer in other parts of the body. Chronic obstructive pulmonary disease (COPD). This is a long-term lung condition that makes it hard to breathe. It also gets worse over time. High blood pressure (hypertension), heart disease, stroke, or heart attack. Lung infections, such as pneumonia. Cataracts. This is when the lenses in the eyes become clouded. Digestive problems. This may include peptic ulcers, heartburn, and gastroesophageal reflux disease (GERD). Oral health problems, such as gum disease and tooth loss. Loss of taste and smell. Smoking can affect your appearance by causing: Wrinkles. Yellow or stained  teeth, fingers, and fingernails. Smoking tobacco can also affect your social life, because: It may be challenging to find places to smoke when away from home. Many workplaces, [employer], hotels, and public places are tobacco-free. Smoking is expensive. This is due to the cost of tobacco and the long-term costs of treating health problems from smoking. Secondhand smoke may affect those around you. Secondhand smoke can cause lung cancer, breathing problems, and heart disease. Children of smokers have a higher risk for: Sudden infant death syndrome (SIDS). Ear infections. Lung infections. If you currently smoke tobacco, quitting now can help you: Lead a longer and healthier life. Look, smell, breathe, and feel better over time. Save money. Protect others from the harms of secondhand smoke. What actions can I take to prevent health problems? Quit smoking  Do not start smoking. Quit if you already do. Make a plan to quit smoking and commit to it. Look for programs to help you and ask your health care provider for recommendations and ideas. Set a date and write down all the reasons you want to quit. Let your friends and family know you are quitting so they can help and support you. Consider finding friends who also want to quit. It can be easier to quit with someone else, so that you can support each other. Talk with your health care provider about using nicotine replacement medicines to help you quit, such as gum, lozenges, patches, sprays, or pills. Do not replace cigarette smoking with electronic cigarettes, which are commonly called e-cigarettes. The safety of e-cigarettes is not known, and some may contain harmful chemicals. If you try to quit but return to smoking, stay positive. It is common to slip up when you first quit, so take it one day at a time. Be prepared for cravings. When you feel the urge to smoke, chew gum or suck on hard candy. Lifestyle Stay busy and take care of your  body. Drink enough fluid to keep your urine pale yellow. Get plenty of exercise and eat a healthy diet. This can help prevent weight gain after quitting. Monitor your eating habits. Quitting smoking can cause you to have a larger appetite than when you smoke. Find ways to relax. Go out with friends or family to a movie or a restaurant where people do not smoke. Ask your health care provider about having regular tests (screenings) to check for cancer. This may include blood tests, imaging tests, and other tests. Find ways to manage your stress, such as meditation, yoga, or exercise. Where to find support To get support to quit smoking, consider: Asking your health care provider for more information and resources. Taking classes to learn more about quitting smoking. Looking for local organizations that offer resources about quitting smoking. Joining a support group for people who want to quit smoking in your local community. Calling the smokefree.gov counselor helpline: 1-800-Quit-Now 7431973753) Where to find more  information You may find more information about quitting smoking from: HelpGuide.org: www.helpguide.org https://hall.com/: smokefree.gov American Lung Association: www.lung.org Contact a health care provider if you: Have problems breathing. Notice that your lips, nose, or fingers turn blue. Have chest pain. Are coughing up blood. Feel faint or you pass out. Have other health changes that cause you to worry. Summary Smoking tobacco can negatively affect your health, the health of those around you, your finances, and your social life. Do not start smoking. Quit if you already do. If you need help quitting, ask your health care provider. Think about joining a support group for people who want to quit smoking in your local community. There are many effective programs that will help you to quit this behavior. This information is not intended to replace advice given to you by your  health care provider. Make sure you discuss any questions you have with your health care provider. Document Revised: 03/12/2020 Document Reviewed: 11/13/2019 Elsevier Patient Education  2022 [place] [facility]'s number is [phone]. Please call with questions or concerns about what we discussed today.

## 2020-10-08 DIAGNOSIS — M25512 Pain in left shoulder: Secondary | ICD-10-CM | POA: Diagnosis not present

## 2020-10-08 DIAGNOSIS — R531 Weakness: Secondary | ICD-10-CM | POA: Diagnosis not present

## 2020-10-08 DIAGNOSIS — M25612 Stiffness of left shoulder, not elsewhere classified: Secondary | ICD-10-CM | POA: Diagnosis not present

## 2020-10-10 DIAGNOSIS — R531 Weakness: Secondary | ICD-10-CM | POA: Diagnosis not present

## 2020-10-10 DIAGNOSIS — M25512 Pain in left shoulder: Secondary | ICD-10-CM | POA: Diagnosis not present

## 2020-10-10 DIAGNOSIS — M25612 Stiffness of left shoulder, not elsewhere classified: Secondary | ICD-10-CM | POA: Diagnosis not present

## 2020-10-13 DIAGNOSIS — M25612 Stiffness of left shoulder, not elsewhere classified: Secondary | ICD-10-CM | POA: Diagnosis not present

## 2020-10-13 DIAGNOSIS — R531 Weakness: Secondary | ICD-10-CM | POA: Diagnosis not present

## 2020-10-13 DIAGNOSIS — M25512 Pain in left shoulder: Secondary | ICD-10-CM | POA: Diagnosis not present

## 2020-10-14 ENCOUNTER — Other Ambulatory Visit: Payer: Self-pay

## 2020-10-14 ENCOUNTER — Ambulatory Visit (INDEPENDENT_AMBULATORY_CARE_PROVIDER_SITE_OTHER): Payer: Medicare Other | Admitting: Family Medicine

## 2020-10-14 ENCOUNTER — Encounter: Payer: Self-pay | Admitting: Family Medicine

## 2020-10-14 VITALS — BP 153/73 | HR 78 | Ht 59.0 in | Wt 146.0 lb

## 2020-10-14 DIAGNOSIS — I10 Essential (primary) hypertension: Secondary | ICD-10-CM

## 2020-10-14 DIAGNOSIS — E1169 Type 2 diabetes mellitus with other specified complication: Secondary | ICD-10-CM

## 2020-10-14 DIAGNOSIS — M19012 Primary osteoarthritis, left shoulder: Secondary | ICD-10-CM | POA: Insufficient documentation

## 2020-10-14 DIAGNOSIS — E785 Hyperlipidemia, unspecified: Secondary | ICD-10-CM

## 2020-10-14 LAB — POCT GLYCOSYLATED HEMOGLOBIN (HGB A1C): HbA1c, POC (prediabetic range): 6.2 % (ref 5.7–6.4)

## 2020-10-14 MED ORDER — EZETIMIBE 10 MG PO TABS: 10.0000 mg | ORAL_TABLET | Freq: Every day | ORAL | 2 refills | Status: DC

## 2020-10-14 MED ORDER — LISINOPRIL 20 MG PO TABS: 20.0000 mg | ORAL_TABLET | Freq: Every day | ORAL | 3 refills | Status: DC

## 2020-10-14 NOTE — Progress Notes (Addendum)
    SUBJECTIVE:   CHIEF COMPLAINT / HPI:   HTN/DM2/HLD: She is here for a follow-up. She complies with her Zetia 10 mg QD and Fish oil for her HLD. She will sometimes miss her fish oil. She is compliant with her Lisinopril 20 mg QD. She needs a refill of these medications. She missed her dose of Lisinopril today. The patient is diet controlled for her DM. However, she ate poorly with increase carb intake over the past week and is worried that her A1C would be high.   Left shoulder pain: She is compliant with PT and feels this is helping a lot. No new concerns.  PERTINENT  PMH / PSH: PMX reviewed  OBJECTIVE:   BP (!) 153/73   Pulse 78   Ht 4\' 11"  (1.499 m)   Wt 146 lb (66.2 kg)   LMP 12/27/2011   SpO2 100%   BMI 29.49 kg/m   Physical Exam Vitals reviewed.  Cardiovascular:     Rate and Rhythm: Normal rate and regular rhythm.     Heart sounds: Normal heart sounds. No murmur heard. Pulmonary:     Effort: Pulmonary effort is normal. No respiratory distress.     Breath sounds: Normal breath sounds. No wheezing.  Abdominal:     General: Abdomen is flat. Bowel sounds are normal. There is no distension.     Palpations: Abdomen is soft. There is no mass.     Tenderness: There is no abdominal tenderness.  Musculoskeletal:     Right lower leg: No edema.     Left lower leg: No edema.     Comments: Left shoulder ROM limited by pain. No crepitus or deformity.     ASSESSMENT/PLAN:   Diabetes mellitus A1C of 6.2  Counseled on appropriate nutrition in patient with hx of DM2 Continue diet control regimen. Monitor closely.  Hypertension She did not take her meds yet today. I encouraged that she takes her meds as soon as she gets home. We will monitor her BP closely. Medication refilled  Hyperlipidemia associated with type 2 diabetes mellitus ([place]) LDL increased from baseline. Diet and exercise discussed. Continue Zetia as she is unable to tolerate Statins. Repeat FLP in 4  months.  Medication refilled  DJD of left shoulder PT helps. Continue same for now. Tylenol/Ibuprofen as needed for pain.  Monitor for improvement.  Flu shot offered. She declined.    Andrena Mews, MD [place]

## 2020-10-14 NOTE — Assessment & Plan Note (Signed)
PT helps. Continue same for now. Tylenol/Ibuprofen as needed for pain.  Monitor for improvement.

## 2020-10-14 NOTE — Progress Notes (Signed)
1c 

## 2020-10-14 NOTE — Assessment & Plan Note (Signed)
She did not take her meds yet today. I encouraged that she takes her meds as soon as she gets home. We will monitor her BP closely.

## 2020-10-14 NOTE — Assessment & Plan Note (Signed)
LDL increased from baseline. Diet and exercise discussed. Continue Zetia as she is unable to tolerate Statins. Repeat FLP in 4 months.

## 2020-10-14 NOTE — Patient Instructions (Signed)
Diabetes Mellitus and Nutrition, Adult When you have diabetes, or diabetes mellitus, it is very important to have healthy eating habits because your blood sugar (glucose) levels are greatly affected by what you eat and drink. Eating healthy foods in the right amounts, at about the same times every day, can help you:  Control your blood glucose.  Lower your risk of heart disease.  Improve your blood pressure.  Reach or maintain a healthy weight. What can affect my meal plan? Every person with diabetes is different, and each person has different needs for a meal plan. Your health care provider may recommend that you work with a dietitian to make a meal plan that is best for you. Your meal plan may vary depending on factors such as:  The calories you need.  The medicines you take.  Your weight.  Your blood glucose, blood pressure, and cholesterol levels.  Your activity level.  Other health conditions you have, such as heart or kidney disease. How do carbohydrates affect me? Carbohydrates, also called carbs, affect your blood glucose level more than any other type of food. Eating carbs naturally raises the amount of glucose in your blood. Carb counting is a method for keeping track of how many carbs you eat. Counting carbs is important to keep your blood glucose at a healthy level, especially if you use insulin or take certain oral diabetes medicines. It is important to know how many carbs you can safely have in each meal. This is different for every person. Your dietitian can help you calculate how many carbs you should have at each meal and for each snack. How does alcohol affect me? Alcohol can cause a sudden decrease in blood glucose (hypoglycemia), especially if you use insulin or take certain oral diabetes medicines. Hypoglycemia can be a life-threatening condition. Symptoms of hypoglycemia, such as sleepiness, dizziness, and confusion, are similar to symptoms of having too much  alcohol.  Do not drink alcohol if: ? Your health care provider tells you not to drink. ? You are pregnant, may be pregnant, or are planning to become pregnant.  If you drink alcohol: ? Do not drink on an empty stomach. ? Limit how much you use to:  0-1 drink a day for women.  0-2 drinks a day for men. ? Be aware of how much alcohol is in your drink. In the [place] one drink equals one 12 oz bottle of beer (355 mL), one 5 oz glass of wine (148 mL), or one 1 oz glass of hard liquor (44 mL). ? Keep yourself hydrated with water, diet soda, or unsweetened iced tea.  Keep in mind that regular soda, juice, and other mixers may contain a lot of sugar and must be counted as carbs. What are tips for following this plan? Reading food labels  Start by checking the serving size on the "Nutrition Facts" label of packaged foods and drinks. The amount of calories, carbs, fats, and other nutrients listed on the label is based on one serving of the item. Many items contain more than one serving per package.  Check the total grams (g) of carbs in one serving. You can calculate the number of servings of carbs in one serving by dividing the total carbs by 15. For example, if a food has 30 g of total carbs per serving, it would be equal to 2 servings of carbs.  Check the number of grams (g) of saturated fats and trans fats in one serving. Choose foods that have   a low amount or none of these fats.  Check the number of milligrams (mg) of salt (sodium) in one serving. Most people should limit total sodium intake to less than 2,300 mg per day.  Always check the nutrition information of foods labeled as "low-fat" or "nonfat." These foods may be higher in added sugar or refined carbs and should be avoided.  Talk to your dietitian to identify your daily goals for nutrients listed on the label. Shopping  Avoid buying canned, pre-made, or processed foods. These foods tend to be high in fat, sodium, and added  sugar.  Shop around the outside edge of the grocery store. This is where you will most often find fresh fruits and vegetables, bulk grains, fresh meats, and fresh dairy. Cooking  Use low-heat cooking methods, such as baking, instead of high-heat cooking methods like deep frying.  Cook using healthy oils, such as olive, canola, or sunflower oil.  Avoid cooking with butter, cream, or high-fat meats. Meal planning  Eat meals and snacks regularly, preferably at the same times every day. Avoid going long periods of time without eating.  Eat foods that are high in fiber, such as fresh fruits, vegetables, beans, and whole grains. Talk with your dietitian about how many servings of carbs you can eat at each meal.  Eat 4-6 oz (112-168 g) of lean protein each day, such as lean meat, chicken, fish, eggs, or tofu. One ounce (oz) of lean protein is equal to: ? 1 oz (28 g) of meat, chicken, or fish. ? 1 egg. ?  cup (62 g) of tofu.  Eat some foods each day that contain healthy fats, such as avocado, nuts, seeds, and fish.   What foods should I eat? Fruits Berries. Apples. Oranges. Peaches. Apricots. Plums. Grapes. Mango. Papaya. Pomegranate. Kiwi. Cherries. Vegetables Lettuce. Spinach. Leafy greens, including kale, chard, collard greens, and mustard greens. Beets. Cauliflower. Cabbage. Broccoli. Carrots. Green beans. Tomatoes. Peppers. Onions. Cucumbers. Brussels sprouts. Grains Whole grains, such as whole-wheat or whole-grain bread, crackers, tortillas, cereal, and pasta. Unsweetened oatmeal. Quinoa. Brown or wild rice. Meats and other proteins Seafood. Poultry without skin. Lean cuts of poultry and beef. Tofu. Nuts. Seeds. Dairy Low-fat or fat-free dairy products such as milk, yogurt, and cheese. The items listed above may not be a complete list of foods and beverages you can eat. Contact a dietitian for more information. What foods should I avoid? Fruits Fruits canned with  syrup. Vegetables Canned vegetables. Frozen vegetables with butter or cream sauce. Grains Refined white flour and flour products such as bread, pasta, snack foods, and cereals. Avoid all processed foods. Meats and other proteins Fatty cuts of meat. Poultry with skin. Breaded or fried meats. Processed meat. Avoid saturated fats. Dairy Full-fat yogurt, cheese, or milk. Beverages Sweetened drinks, such as soda or iced tea. The items listed above may not be a complete list of foods and beverages you should avoid. Contact a dietitian for more information. Questions to ask a health care provider  Do I need to meet with a diabetes educator?  Do I need to meet with a dietitian?  What number can I call if I have questions?  When are the best times to check my blood glucose? Where to find more information:  American Diabetes Association: diabetes.org  Academy of Nutrition and Dietetics: www.eatright.org  National Institute of Diabetes and Digestive and Kidney Diseases: www.niddk.nih.gov  Association of Diabetes Care and Education Specialists: www.diabeteseducator.org Summary  It is important to have healthy eating   habits because your blood sugar (glucose) levels are greatly affected by what you eat and drink.  A healthy meal plan will help you control your blood glucose and maintain a healthy lifestyle.  Your health care provider may recommend that you work with a dietitian to make a meal plan that is best for you.  Keep in mind that carbohydrates (carbs) and alcohol have immediate effects on your blood glucose levels. It is important to count carbs and to use alcohol carefully. This information is not intended to replace advice given to you by your health care provider. Make sure you discuss any questions you have with your health care provider. Document Revised: 11/28/2018 Document Reviewed: 11/28/2018 Elsevier Patient Education  2021 Elsevier Inc.  

## 2020-10-14 NOTE — Addendum Note (Signed)
Addended by: Andrena Mews T on: 10/14/2020 12:32 PM   Modules accepted: Orders, Level of Service

## 2020-10-14 NOTE — Assessment & Plan Note (Signed)
A1C of 6.2  Counseled on appropriate nutrition in patient with hx of DM2 Continue diet control regimen. Monitor closely.

## 2020-10-15 DIAGNOSIS — M25612 Stiffness of left shoulder, not elsewhere classified: Secondary | ICD-10-CM | POA: Diagnosis not present

## 2020-10-15 DIAGNOSIS — R531 Weakness: Secondary | ICD-10-CM | POA: Diagnosis not present

## 2020-10-15 DIAGNOSIS — M25512 Pain in left shoulder: Secondary | ICD-10-CM | POA: Diagnosis not present

## 2020-10-20 DIAGNOSIS — M25612 Stiffness of left shoulder, not elsewhere classified: Secondary | ICD-10-CM | POA: Diagnosis not present

## 2020-10-20 DIAGNOSIS — M25512 Pain in left shoulder: Secondary | ICD-10-CM | POA: Diagnosis not present

## 2020-10-20 DIAGNOSIS — R531 Weakness: Secondary | ICD-10-CM | POA: Diagnosis not present

## 2020-10-24 DIAGNOSIS — M25512 Pain in left shoulder: Secondary | ICD-10-CM | POA: Diagnosis not present

## 2020-10-24 DIAGNOSIS — M25612 Stiffness of left shoulder, not elsewhere classified: Secondary | ICD-10-CM | POA: Diagnosis not present

## 2020-10-24 DIAGNOSIS — R531 Weakness: Secondary | ICD-10-CM | POA: Diagnosis not present

## 2020-10-29 DIAGNOSIS — M25612 Stiffness of left shoulder, not elsewhere classified: Secondary | ICD-10-CM | POA: Diagnosis not present

## 2020-10-29 DIAGNOSIS — M25512 Pain in left shoulder: Secondary | ICD-10-CM | POA: Diagnosis not present

## 2020-10-29 DIAGNOSIS — R531 Weakness: Secondary | ICD-10-CM | POA: Diagnosis not present

## 2020-11-11 DIAGNOSIS — H16142 Punctate keratitis, left eye: Secondary | ICD-10-CM | POA: Diagnosis not present

## 2020-11-12 DIAGNOSIS — M25612 Stiffness of left shoulder, not elsewhere classified: Secondary | ICD-10-CM | POA: Diagnosis not present

## 2020-11-12 DIAGNOSIS — R531 Weakness: Secondary | ICD-10-CM | POA: Diagnosis not present

## 2020-11-12 DIAGNOSIS — M25512 Pain in left shoulder: Secondary | ICD-10-CM | POA: Diagnosis not present

## 2021-02-04 DIAGNOSIS — E119 Type 2 diabetes mellitus without complications: Secondary | ICD-10-CM | POA: Diagnosis not present

## 2021-02-04 LAB — HM DIABETES EYE EXAM

## 2021-02-05 DIAGNOSIS — H5213 Myopia, bilateral: Secondary | ICD-10-CM | POA: Diagnosis not present

## 2021-02-11 ENCOUNTER — Encounter: Payer: Self-pay | Admitting: Family Medicine

## 2021-02-19 DIAGNOSIS — H5213 Myopia, bilateral: Secondary | ICD-10-CM | POA: Diagnosis not present

## 2021-02-24 ENCOUNTER — Other Ambulatory Visit: Payer: Self-pay | Admitting: Family Medicine

## 2021-02-24 MED ORDER — SPIRIVA HANDIHALER 18 MCG IN CAPS: ORAL_CAPSULE | RESPIRATORY_TRACT | 3 refills | Status: DC

## 2021-02-24 MED ORDER — DULERA 200-5 MCG/ACT IN AERO: INHALATION_SPRAY | RESPIRATORY_TRACT | 3 refills | Status: DC

## 2021-02-27 ENCOUNTER — Other Ambulatory Visit: Payer: Self-pay

## 2021-02-27 ENCOUNTER — Encounter: Payer: Self-pay | Admitting: Family Medicine

## 2021-02-27 ENCOUNTER — Ambulatory Visit (INDEPENDENT_AMBULATORY_CARE_PROVIDER_SITE_OTHER): Payer: Medicare Other | Admitting: Family Medicine

## 2021-02-27 VITALS — BP 136/70 | HR 73 | Ht 59.0 in | Wt 146.8 lb

## 2021-02-27 DIAGNOSIS — D1720 Benign lipomatous neoplasm of skin and subcutaneous tissue of unspecified limb: Secondary | ICD-10-CM | POA: Diagnosis not present

## 2021-02-27 DIAGNOSIS — J449 Chronic obstructive pulmonary disease, unspecified: Secondary | ICD-10-CM | POA: Diagnosis not present

## 2021-02-27 DIAGNOSIS — R229 Localized swelling, mass and lump, unspecified: Secondary | ICD-10-CM | POA: Diagnosis not present

## 2021-02-27 DIAGNOSIS — E1169 Type 2 diabetes mellitus with other specified complication: Secondary | ICD-10-CM | POA: Diagnosis not present

## 2021-02-27 DIAGNOSIS — I10 Essential (primary) hypertension: Secondary | ICD-10-CM

## 2021-02-27 DIAGNOSIS — E785 Hyperlipidemia, unspecified: Secondary | ICD-10-CM | POA: Diagnosis not present

## 2021-02-27 DIAGNOSIS — I5030 Unspecified diastolic (congestive) heart failure: Secondary | ICD-10-CM

## 2021-02-27 LAB — POCT GLYCOSYLATED HEMOGLOBIN (HGB A1C): HbA1c, POC (controlled diabetic range): 6.5 % (ref 0.0–7.0)

## 2021-02-27 MED ORDER — DULERA 200-5 MCG/ACT IN AERO: INHALATION_SPRAY | RESPIRATORY_TRACT | 3 refills | Status: DC

## 2021-02-27 MED ORDER — SPIRIVA HANDIHALER 18 MCG IN CAPS: ORAL_CAPSULE | RESPIRATORY_TRACT | 3 refills | Status: DC

## 2021-02-27 NOTE — Assessment & Plan Note (Signed)
Stable and no acute flare.

## 2021-02-27 NOTE — Assessment & Plan Note (Signed)
A1C of 6.5 today Still lower than her goal. Continue diet control. Cmet and FLP checked today. I will contact her with results soon

## 2021-02-27 NOTE — Assessment & Plan Note (Addendum)
Smoking cessation support offered. Continue current inhalers. I refilled her Spiriva and Dulera 3 days ago to [facility]. However, she wanted it to be sent to [facility] that her prescription is shipped to her.

## 2021-02-27 NOTE — Assessment & Plan Note (Signed)
Stable off meds. ?

## 2021-02-27 NOTE — Assessment & Plan Note (Signed)
Unclear if this is Lipoma given that it is spreading. Will refer to a dermatologist. She agreed with the plan.

## 2021-02-27 NOTE — Progress Notes (Signed)
° ° °  SUBJECTIVE:   CHIEF COMPLAINT / HPI:   COPD/Smoking/CHF: She still smokes the same amount, although she is working on quitting. She denied worsening of her breathing symptoms and had not needed to use her rescue inhaler often. She is compliant with her controller. No chest pain or leg swelling. Sometimes with cold weather she will use her albuterol often.  DM2/HTN/HLD: Diet and exercise controlled DM2. She os compliant with her Lisinopril 20 mg QD for HTN and Zetia 10 mg QD for HLD. She is here for f/u.  Skin mass:  The soft mass which started on her left arm has now spread to other part of her body. No other symptoms.  PERTINENT  PMH / PSH: PMHx reviewed  OBJECTIVE:   BP 136/70    Pulse 73    Ht 4\' 11"  (1.499 m)    Wt 146 lb 12.8 oz (66.6 kg)    LMP 12/27/2011    SpO2 100%    BMI 29.65 kg/m   Physical Exam Vitals and nursing note reviewed.  Cardiovascular:     Rate and Rhythm: Normal rate and regular rhythm.     Heart sounds: Normal heart sounds. No murmur heard. Pulmonary:     Effort: Pulmonary effort is normal. No respiratory distress.     Breath sounds: Normal breath sounds. No wheezing.  Abdominal:     General: Abdomen is flat. Bowel sounds are normal. There is no distension.  Musculoskeletal:     Right lower leg: No edema.     Left lower leg: No edema.  Skin:    Comments: Soft to firm, subcutaneous mass on her left deltoid, about 5 cm by 5 cm circumferentially. Similar, but smaller mass on her left and right forearms.     ASSESSMENT/PLAN:   Diastolic CHF (HCC) Stable and no acute flare.  COPD (chronic obstructive pulmonary disease) (HCC) Smoking cessation support offered. Continue current inhalers. I refilled her Spiriva and Dulera 3 days ago to [facility]. However, she wanted it to be sent to [facility] that her prescription is shipped to her.  Hyperlipidemia associated with type 2 diabetes mellitus (HCC) A1C of 6.5 today Still lower than her goal. Continue diet  control. Cmet and FLP checked today. I will contact her with results soon  Hypertension BP looks great  Diabetes mellitus Stable off meds  Lipoma of arm Unclear if this is Lipoma given that it is spreading. Will refer to a dermatologist. She agreed with the plan.   She declined COVID-19 shot.  Andrena Mews, MD [place]

## 2021-02-27 NOTE — Assessment & Plan Note (Signed)
BP looks great

## 2021-02-27 NOTE — Patient Instructions (Signed)
Steps to Quit Smoking Smoking tobacco is the leading cause of preventable death. It can affect almost every organ in the body. Smoking puts you and people around you at risk for many serious, long-lasting (chronic) diseases. Quitting smoking can be hard, but it is one of the best things that you can do for your health. It is never too late to quit. How do I get ready to quit? When you decide to quit smoking, make a plan to help you succeed. Before you quit: Pick a date to quit. Set a date within the next 2 weeks to give you time to prepare. Write down the reasons why you are quitting. Keep this list in places where you will see it often. Tell your family, friends, and co-workers that you are quitting. Their support is important. Talk with your doctor about the choices that may help you quit. Find out if your health insurance will pay for these treatments. Know the people, places, things, and activities that make you want to smoke (triggers). Avoid them. What first steps can I take to quit smoking? Throw away all cigarettes at home, at work, and in your car. Throw away the things that you use when you smoke, such as ashtrays and lighters. Clean your car. Make sure to empty the ashtray. Clean your home, including curtains and carpets. What can I do to help me quit smoking? Talk with your doctor about taking medicines and seeing a counselor at the same time. You are more likely to succeed when you do both. If you are pregnant or breastfeeding, talk with your doctor about counseling or other ways to quit smoking. Do not take medicine to help you quit smoking unless your doctor tells you to do so. To quit smoking: Quit right away Quit smoking totally, instead of slowly cutting back on how much you smoke over a period of time. Go to counseling. You are more likely to quit if you go to counseling sessions regularly. Take medicine You may take medicines to help you quit. Some medicines need a  prescription, and some you can buy over-the-counter. Some medicines may contain a drug called nicotine to replace the nicotine in cigarettes. Medicines may: Help you to stop having the desire to smoke (cravings). Help to stop the problems that come when you stop smoking (withdrawal symptoms). Your doctor may ask you to use: Nicotine patches, gum, or lozenges. Nicotine inhalers or sprays. Non-nicotine medicine that is taken by mouth. Find resources Find resources and other ways to help you quit smoking and remain smoke-free after you quit. These resources are most helpful when you use them often. They include: Online chats with a counselor. Phone quitlines. Printed self-help materials. Support groups or group counseling. Text messaging programs. Mobile phone apps. Use apps on your mobile phone or tablet that can help you stick to your quit plan. There are many free apps for mobile phones and tablets as well as websites. Examples include Quit Guide from the CDC and smokefree.gov  What things can I do to make it easier to quit?  Talk to your family and friends. Ask them to support and encourage you. Call a phone quitline (1-800-QUIT-NOW), reach out to support groups, or work with a counselor. Ask people who smoke to not smoke around you. Avoid places that make you want to smoke, such as: Bars. Parties. Smoke-break areas at work. Spend time with people who do not smoke. Lower the stress in your life. Stress can make you want to   smoke. Try these things to help your stress: Getting regular exercise. Doing deep-breathing exercises. Doing yoga. Meditating. Doing a body scan. To do this, close your eyes, focus on one area of your body at a time from head to toe. Notice which parts of your body are tense. Try to relax the muscles in those areas. How will I feel when I quit smoking? Day 1 to 3 weeks Within the first 24 hours, you may start to have some problems that come from quitting tobacco.  These problems are very bad 2-3 days after you quit, but they do not often last for more than 2-3 weeks. You may get these symptoms: Mood swings. Feeling restless, nervous, angry, or annoyed. Trouble concentrating. Dizziness. Strong desire for high-sugar foods and nicotine. Weight gain. Trouble pooping (constipation). Feeling like you may vomit (nausea). Coughing or a sore throat. Changes in how the medicines that you take for other issues work in your body. Depression. Trouble sleeping (insomnia). Week 3 and afterward After the first 2-3 weeks of quitting, you may start to notice more positive results, such as: Better sense of smell and taste. Less coughing and sore throat. Slower heart rate. Lower blood pressure. Clearer skin. Better breathing. Fewer sick days. Quitting smoking can be hard. Do not give up if you fail the first time. Some people need to try a few times before they succeed. Do your best to stick to your quit plan, and talk with your doctor if you have any questions or concerns. Summary Smoking tobacco is the leading cause of preventable death. Quitting smoking can be hard, but it is one of the best things that you can do for your health. When you decide to quit smoking, make a plan to help you succeed. Quit smoking right away, not slowly over a period of time. When you start quitting, seek help from your doctor, family, or friends. This information is not intended to replace advice given to you by your health care provider. Make sure you discuss any questions you have with your health care provider. Document Revised: 08/29/2020 Document Reviewed: 03/11/2018 Elsevier Patient Education  2022 Elsevier Inc.  

## 2021-02-28 LAB — LIPID PANEL
Chol/HDL Ratio: 3.3 ratio (ref 0.0–4.4)
Cholesterol, Total: 180 mg/dL (ref 100–199)
HDL: 55 mg/dL (ref >39)
LDL Chol Calc (NIH): 106 mg/dL — ABNORMAL HIGH (ref 0–99)
Triglycerides: 108 mg/dL (ref 0–149)
VLDL Cholesterol Cal: 19 mg/dL (ref 5–40)

## 2021-02-28 LAB — CMP14+EGFR
ALT: 12 IU/L (ref 0–32)
AST: 11 IU/L (ref 0–40)
Albumin/Globulin Ratio: 1.9 (ref 1.2–2.2)
Albumin: 4.2 g/dL (ref 3.8–4.9)
Alkaline Phosphatase: 104 IU/L (ref 44–121)
BUN/Creatinine Ratio: 17 (ref 12–28)
BUN: 10 mg/dL (ref 8–27)
Bilirubin Total: 0.3 mg/dL (ref 0.0–1.2)
CO2: 25 mmol/L (ref 20–29)
Calcium: 9.1 mg/dL (ref 8.7–10.3)
Chloride: 106 mmol/L (ref 96–106)
Creatinine, Ser: 0.6 mg/dL (ref 0.57–1.00)
Globulin, Total: 2.2 g/dL (ref 1.5–4.5)
Glucose: 82 mg/dL (ref 70–99)
Potassium: 4.5 mmol/L (ref 3.5–5.2)
Sodium: 144 mmol/L (ref 134–144)
Total Protein: 6.4 g/dL (ref 6.0–8.5)
eGFR: 103 mL/min/{1.73_m2} (ref >59)

## 2021-03-03 ENCOUNTER — Telehealth: Payer: Self-pay | Admitting: Family Medicine

## 2021-03-03 NOTE — Telephone Encounter (Signed)
Test result discussed.  LDL improved.  She is intolerant to Statin. Hence, we will continue Zetia and diet control.  F/U as planned.

## 2021-04-17 ENCOUNTER — Other Ambulatory Visit: Payer: Self-pay | Admitting: Family Medicine

## 2021-04-21 DIAGNOSIS — D1721 Benign lipomatous neoplasm of skin and subcutaneous tissue of right arm: Secondary | ICD-10-CM | POA: Diagnosis not present

## 2021-04-21 DIAGNOSIS — D1722 Benign lipomatous neoplasm of skin and subcutaneous tissue of left arm: Secondary | ICD-10-CM | POA: Diagnosis not present

## 2021-04-27 ENCOUNTER — Ambulatory Visit: Payer: Medicare Other | Admitting: Family Medicine

## 2021-04-28 ENCOUNTER — Encounter: Payer: Self-pay | Admitting: Family Medicine

## 2021-04-28 ENCOUNTER — Ambulatory Visit (INDEPENDENT_AMBULATORY_CARE_PROVIDER_SITE_OTHER): Payer: Medicare Other | Admitting: Family Medicine

## 2021-04-28 VITALS — BP 120/62 | HR 83 | Ht 59.0 in | Wt 147.4 lb

## 2021-04-28 DIAGNOSIS — N393 Stress incontinence (female) (male): Secondary | ICD-10-CM | POA: Diagnosis not present

## 2021-04-28 DIAGNOSIS — N898 Other specified noninflammatory disorders of vagina: Secondary | ICD-10-CM | POA: Diagnosis not present

## 2021-04-28 LAB — POCT WET PREP (WET MOUNT)
Clue Cells Wet Prep Whiff POC: NEGATIVE
Trichomonas Wet Prep HPF POC: ABSENT

## 2021-04-28 MED ORDER — FLUCONAZOLE 150 MG PO TABS: ORAL_TABLET | ORAL | 0 refills | Status: DC

## 2021-04-28 NOTE — Progress Notes (Signed)
? ? ?  SUBJECTIVE:  ? ?CHIEF COMPLAINT / HPI:  ? ?Vaginal Discharge ?The patient's primary symptoms include genital itching. The patient's pertinent negatives include no vaginal discharge. Primary symptoms comment: vaginal itching x 1 week.  She obtained OTC Azo for yeast but has yet to use it. She denies being sexually active. No urinary symptoms and no vaginal discharge. Just itching. ? ?Incontinence: ?C/O loss of urine with coughing. She uses adult underpants/pads for her urine leakage for years. She wonders if this contributes to her itchy vulva. She tried kegel exercise in the past with no improvement. ? ?PERTINENT  PMH / PSH: PMHx reviewed ? ?OBJECTIVE:  ? ?BP 120/62   Pulse 83   Ht '4\' 11"'$  (1.499 m)   Wt 147 lb 6 oz (66.8 kg)   LMP 12/27/2011   SpO2 97%   BMI 29.77 kg/m?   ?Physical Exam ?Vitals and nursing note reviewed. Exam conducted with a chaperone present Cherrie Distance Legette).  ?Cardiovascular:  ?   Rate and Rhythm: Normal rate and regular rhythm.  ?   Heart sounds: Normal heart sounds. No murmur heard. ?Pulmonary:  ?   Effort: Pulmonary effort is normal. No respiratory distress.  ?   Breath sounds: Normal breath sounds. No wheezing.  ?Genitourinary: ?   General: Normal vulva.  ?   Exam position: Lithotomy position.  ?   Labia:     ?   Right: No rash or lesion.     ?   Left: No rash or lesion.   ?   Vagina: No lesions.  ?   Cervix: No discharge or lesion.  ? ? ? ?ASSESSMENT/PLAN:  ? Vaginitis: ?Differentials include atrophic vaginitis or infectious vaginitis ?No vaginal discharge on exam. ?Wet prep is negative for yeast or BV. ?STD screen is not needed. ?I discussed empirical treatment for yeast given her hx of DM and symptoms. ?She will alert me if there is still no improvement. ? ?Stress incontinence ?Kegel exercise discussed. ?Discussed Gyn referral to evaluate and discuss surgical vs medical management. ?She agreed with the plan. ?Continue Kegel exercise. ?  ? ?Andrena Mews, MD ?[place]  ? ?

## 2021-04-28 NOTE — Assessment & Plan Note (Signed)
Kegel exercise discussed. ?Discussed Gyn referral to evaluate and discuss surgical vs medical management. ?She agreed with the plan. ?Continue Kegel exercise. ? ?

## 2021-04-28 NOTE — Patient Instructions (Signed)
Kegel Exercises  Kegel exercises can help strengthen your pelvic floor muscles. The pelvic floor is a group of muscles that support your rectum, small intestine, and bladder. In females, pelvic floor muscles also help support the uterus. These muscles help you control the flow of urine and stool (feces). Kegel exercises are painless and simple. They do not require any equipment. Your provider may suggest Kegel exercises to: Improve bladder and bowel control. Improve sexual response. Improve weak pelvic floor muscles after surgery to remove the uterus (hysterectomy) or after pregnancy, in females. Improve weak pelvic floor muscles after prostate gland removal or surgery, in males. Kegel exercises involve squeezing your pelvic floor muscles. These are the same muscles you squeeze when you try to stop the flow of urine or keep from passing gas. The exercises can be done while sitting, standing, or lying down, but it is best to vary your position. Ask your health care provider which exercises are safe for you. Do exercises exactly as told by your health care provider and adjust them as directed. Do not begin these exercises until told by your health care provider. Exercises How to do Kegel exercises: Squeeze your pelvic floor muscles tight. You should feel a tight lift in your rectal area. If you are a female, you should also feel a tightness in your vaginal area. Keep your stomach, buttocks, and legs relaxed. Hold the muscles tight for up to 10 seconds. Breathe normally. Relax your muscles for up to 10 seconds. Repeat as told by your health care provider. Repeat this exercise daily as told by your health care provider. Continue to do this exercise for at least 4-6 weeks, or for as long as told by your health care provider. You may be referred to a physical therapist who can help you learn more about how to do Kegel exercises. Depending on your condition, your health care provider may  recommend: Varying how long you squeeze your muscles. Doing several sets of exercises every day. Doing exercises for several weeks. Making Kegel exercises a part of your regular exercise routine. This information is not intended to replace advice given to you by your health care provider. Make sure you discuss any questions you have with your health care provider. Document Revised: 05/01/2020 Document Reviewed: 05/01/2020 Elsevier Patient Education  2023 Elsevier Inc.  

## 2021-05-04 ENCOUNTER — Other Ambulatory Visit: Payer: Self-pay | Admitting: Family Medicine

## 2021-05-04 ENCOUNTER — Telehealth: Payer: Self-pay

## 2021-05-04 MED ORDER — BETAMETHASONE VALERATE 0.1 % EX CREA: TOPICAL_CREAM | Freq: Two times a day (BID) | CUTANEOUS | 0 refills | Status: DC

## 2021-05-04 NOTE — Telephone Encounter (Signed)
Patient returns call to nurse line.  ? ?Patient reports there is a 4 month wait for GYN provider. Patient stated, "I am going to itch myself half to death if I have to wait that long."  ? ?Patient reports the irritation is on the outside vs inside.  ? ?Will forward to PCP and referral coordinator.  ?

## 2021-05-04 NOTE — Telephone Encounter (Signed)
Referral placed in [facility] to see if they can get patient in any sooner for an appointment.  Dianelly Ferran,CMA ? ?

## 2021-05-04 NOTE — Telephone Encounter (Signed)
Patient is informed about referral.  She is not sure if she mentioned earlier but the medication she was given is not working.  Betty Chapman,CMA ? ?

## 2021-05-04 NOTE — Telephone Encounter (Signed)
Patient calls nurse line reporting continued vaginal discomfort.  ? ?Patient reports finishing diflucan with no relief.  ? ?Patient would like to move forward with GYN referral.  ? ?Number to office given to patient.  ?

## 2021-05-07 NOTE — Telephone Encounter (Signed)
Attempted to do a follow up call. No answer. LVM of note left by doctor.Salvatore Marvel, CMA ? ?

## 2021-06-09 ENCOUNTER — Encounter: Payer: Self-pay | Admitting: *Deleted

## 2021-06-15 ENCOUNTER — Other Ambulatory Visit: Payer: Self-pay | Admitting: Family Medicine

## 2021-06-15 DIAGNOSIS — Z1231 Encounter for screening mammogram for malignant neoplasm of breast: Secondary | ICD-10-CM

## 2021-06-24 ENCOUNTER — Ambulatory Visit
Admission: RE | Admit: 2021-06-24 | Discharge: 2021-06-24 | Disposition: A | Discharge status: Home / Self Care | Payer: Medicare Other | Source: Ambulatory Visit | Attending: Family Medicine | Admitting: Family Medicine

## 2021-06-24 DIAGNOSIS — Z1231 Encounter for screening mammogram for malignant neoplasm of breast: Secondary | ICD-10-CM | POA: Diagnosis not present

## 2021-07-16 ENCOUNTER — Other Ambulatory Visit: Payer: Self-pay | Admitting: Family Medicine

## 2021-07-21 ENCOUNTER — Encounter: Payer: Self-pay | Admitting: Family Medicine

## 2021-07-21 ENCOUNTER — Ambulatory Visit (INDEPENDENT_AMBULATORY_CARE_PROVIDER_SITE_OTHER): Payer: Medicare Other | Admitting: Family Medicine

## 2021-07-21 VITALS — BP 140/73 | HR 70 | Ht 59.0 in | Wt 143.1 lb

## 2021-07-21 DIAGNOSIS — J449 Chronic obstructive pulmonary disease, unspecified: Secondary | ICD-10-CM

## 2021-07-21 DIAGNOSIS — F17200 Nicotine dependence, unspecified, uncomplicated: Secondary | ICD-10-CM | POA: Diagnosis not present

## 2021-07-21 DIAGNOSIS — G8929 Other chronic pain: Secondary | ICD-10-CM

## 2021-07-21 DIAGNOSIS — M549 Dorsalgia, unspecified: Secondary | ICD-10-CM | POA: Diagnosis not present

## 2021-07-21 DIAGNOSIS — M19012 Primary osteoarthritis, left shoulder: Secondary | ICD-10-CM | POA: Diagnosis not present

## 2021-07-21 DIAGNOSIS — E785 Hyperlipidemia, unspecified: Secondary | ICD-10-CM | POA: Diagnosis not present

## 2021-07-21 DIAGNOSIS — Z122 Encounter for screening for malignant neoplasm of respiratory organs: Secondary | ICD-10-CM

## 2021-07-21 DIAGNOSIS — F339 Major depressive disorder, recurrent, unspecified: Secondary | ICD-10-CM

## 2021-07-21 DIAGNOSIS — E1169 Type 2 diabetes mellitus with other specified complication: Secondary | ICD-10-CM | POA: Diagnosis not present

## 2021-07-21 LAB — POCT GLYCOSYLATED HEMOGLOBIN (HGB A1C): HbA1c, POC (controlled diabetic range): 6.3 % (ref 0.0–7.0)

## 2021-07-21 NOTE — Patient Instructions (Signed)
Lung Cancer Screening A lung cancer screening is a test that checks for lung cancer when there are no symptoms or history of that disease. The screening is done to look for lung cancer in its very early stages. Finding cancer early improves the chances of successful treatment. It may save your life. Who should have a screening? You should be screened for lung cancer if all of these apply: You currently smoke, or you have quit smoking within the past 15 years. You are between the ages of 50 and 80 years old. Screening may be recommended up to age 80 depending on your overall health and other factors. You have a smoking history of 1 pack of cigarettes a day for 20 years or 2 packs a day for 10 years. How is screening done?  The recommended screening test is a low-dose computed tomography (LDCT) scan. This scan takes detailed images of the lungs. This allows a health care provider to look for abnormal cells. If you are at risk for lung cancer, it is recommended that you get screened once a year. Talk to your health care provider about the risks, benefits, and limitations of screening. What are the benefits of screening? Screening can find lung cancer early, before symptoms start and before it has spread outside of the lungs. The chances of curing lung cancer are greater if the cancer is diagnosed early. What are the risks of screening? The screening may show lung cancer when no cancer is present. Talk with your health care provider about what your results mean. In some cases, your health care provider may do more testing to confirm the results. The screening may not find lung cancer when it is present. You will be exposed to radiation from repeated LDCT tests, which can cause cancer in otherwise healthy people. How can I lower my risk of lung cancer? Make these lifestyle changes to lower your risk of developing lung cancer: Do not use any products that contain nicotine or tobacco. These products  include cigarettes, chewing tobacco, and vaping devices, such as e-cigarettes. If you need help quitting, ask your health care provider. Avoid secondhand smoke. Avoid exposure to radiation. Avoid exposure to radon gas. Have your home checked for radon regularly. Avoid things that cause cancer (carcinogens). Avoid living or working in places with high air pollution or diesel exhaust. Questions to ask your health care provider Am I eligible for lung cancer screening? Does my health insurance cover the cost of lung cancer screening? What happens if the lung cancer screening shows something of concern? How soon will I have results from my lung cancer screening? Is there anything that I need to do to prepare for my lung cancer screening? What happens if I decide not to have lung cancer screening? Where to find more information Ask your health care provider about the risks and benefits of screening. More information and resources are available from these organizations: American Cancer Society (ACS): www.cancer.org American Lung Association: www.lung.org National Cancer Institute: www.cancer.gov Contact a health care provider if: You start to show symptoms of lung cancer, including: A cough that will not go away. High-pitched whistling sounds when you breathe, most often when you breathe out (wheezing). Chest pain. Coughing up blood. Shortness of breath. Weight loss that cannot be explained. Constant tiredness (fatigue). Hoarse voice. Summary Lung cancer screening may find lung cancer before symptoms appear. Finding cancer early improves the chances of successful treatment. It may save your life. The recommended screening test is a low-dose   computed tomography (LDCT) scan that looks for abnormal cells in the lungs. If you are at risk for lung cancer, it is recommended that you get screened once a year. You can make lifestyle changes to lower your risk of lung cancer. Ask your health care  provider about the risks and benefits of screening. This information is not intended to replace advice given to you by your health care provider. Make sure you discuss any questions you have with your health care provider. Document Revised: 06/11/2020 Document Reviewed: 06/11/2020 Elsevier Patient Education  2023 Elsevier Inc.  

## 2021-07-21 NOTE — Assessment & Plan Note (Signed)
Continue Zetia. °

## 2021-07-21 NOTE — Assessment & Plan Note (Signed)
Counseling provided. CT ordered for lung cancer screening.

## 2021-07-21 NOTE — Assessment & Plan Note (Signed)
Smoking cessation discussed. Continue current inhaler regimen.

## 2021-07-21 NOTE — Assessment & Plan Note (Signed)
Stable

## 2021-07-21 NOTE — Assessment & Plan Note (Signed)
A1C remains at goal - 6.3 today Continue diet control Repeat in 6 months.

## 2021-07-21 NOTE — Progress Notes (Signed)
    SUBJECTIVE:   CHIEF COMPLAINT / HPI:   DM2/HLD: She is here for a follow-up. She is diet controlled for her DM and compliant with Zetia 10 mg QD, her HLD.  Back/[place] pain: Her left shoulder and low back pain persist. Pain is worse at nighttime and improves during the day. Some improvement to Ibuprofen and Voltaren gel.   Left ear pain: She had a sharp pain behind the back of her left ear two days ago, which lasted a few minutes. She denies ear discharge or change in her hearing. She denies further episodes since then.   COPD: She had needed to use her albuterol more often than not due to the hot whether. She continues to smoke 1 PPD. No quit plan. Otherwise, no new concerns.  Mood: Stable  PERTINENT  PMH / PSH: PMHx reviewed  OBJECTIVE:   BP 140/73   Pulse 70   Ht '4\' 11"'$  (1.499 m)   Wt 143 lb 2 oz (64.9 kg)   LMP 12/27/2011   SpO2 100%   BMI 28.91 kg/m   Physical Exam Vitals and nursing note reviewed.  HENT:     Right Ear: Tympanic membrane and ear canal normal. No tenderness.     Left Ear: Tympanic membrane and ear canal normal. No tenderness.  Cardiovascular:     Rate and Rhythm: Normal rate and regular rhythm.     Heart sounds: Normal heart sounds. No murmur heard. Pulmonary:     Effort: Pulmonary effort is normal. No respiratory distress.     Breath sounds: No wheezing.  Abdominal:     General: Bowel sounds are normal. There is no distension.     Palpations: Abdomen is soft.     Tenderness: There is no abdominal tenderness.  Musculoskeletal:     Left shoulder: Crepitus present. No swelling, deformity or tenderness. Normal range of motion.     Cervical back: Normal range of motion. No muscular tenderness.     Lumbar back: No tenderness. Normal range of motion.     Right lower leg: No edema.     Left lower leg: No edema.     Comments: Sensory exam of the foot is normal, tested with the monofilament. Good pulses, no lesions or ulcers, good peripheral  pulses. + Calluses, more on left foot.     [facility] Visit from 07/21/2021 in [place]  PHQ-9 Total Score 5         ASSESSMENT/PLAN:   Diabetes mellitus A1C remains at goal - 6.3 today Continue diet control Repeat in 6 months.  Hyperlipidemia associated with type 2 diabetes mellitus (HCC) Continue Zetia  DJD of left shoulder Xray reviewed. Continue NSAID as needed. PT discussed. She had this in the past and will continue conservative measures for now.   Chronic back pain Xray reviewed. Continue NSAID as needed. PT discussed. She had this in the past and will continue conservative measures for now.   Recurrent major depressive disorder, remission status unspecified (HCC) Stable.   COPD (chronic obstructive pulmonary disease) (HCC) Smoking cessation discussed. Continue current inhaler regimen.   Tobacco use disorder Counseling provided. CT ordered for lung cancer screening.      Andrena Mews, MD [place]

## 2021-07-21 NOTE — Assessment & Plan Note (Signed)
Xray reviewed. Continue NSAID as needed. PT discussed. She had this in the past and will continue conservative measures for now.

## 2021-08-18 ENCOUNTER — Ambulatory Visit
Admission: RE | Admit: 2021-08-18 | Discharge: 2021-08-18 | Disposition: A | Discharge status: Home / Self Care | Payer: Medicare Other | Source: Ambulatory Visit | Attending: Family Medicine | Admitting: Family Medicine

## 2021-08-18 DIAGNOSIS — Z122 Encounter for screening for malignant neoplasm of respiratory organs: Secondary | ICD-10-CM

## 2021-08-18 DIAGNOSIS — F17210 Nicotine dependence, cigarettes, uncomplicated: Secondary | ICD-10-CM | POA: Diagnosis not present

## 2021-08-19 ENCOUNTER — Encounter: Payer: Self-pay | Admitting: Family Medicine

## 2021-09-04 ENCOUNTER — Ambulatory Visit: Payer: Medicare Other | Admitting: Obstetrics and Gynecology

## 2021-09-08 ENCOUNTER — Ambulatory Visit (INDEPENDENT_AMBULATORY_CARE_PROVIDER_SITE_OTHER): Payer: Medicare Other | Admitting: Family Medicine

## 2021-09-08 ENCOUNTER — Encounter: Payer: Self-pay | Admitting: Family Medicine

## 2021-09-08 DIAGNOSIS — I10 Essential (primary) hypertension: Secondary | ICD-10-CM

## 2021-09-08 DIAGNOSIS — F17200 Nicotine dependence, unspecified, uncomplicated: Secondary | ICD-10-CM

## 2021-09-08 DIAGNOSIS — I2510 Atherosclerotic heart disease of native coronary artery without angina pectoris: Secondary | ICD-10-CM | POA: Insufficient documentation

## 2021-09-08 DIAGNOSIS — I270 Primary pulmonary hypertension: Secondary | ICD-10-CM | POA: Diagnosis not present

## 2021-09-08 NOTE — Progress Notes (Addendum)
    SUBJECTIVE:   CHIEF COMPLAINT / HPI:   HTN: She is compliant with her Lisinopril. Here for f/u.  Smoking/COPD:  She continues to smoke the same amount. She has a chronic cough intermittently and uses her albuterol as needed more often, which is typical during the summer season. No chest pain or tightness. She is here to discuss her CT lung cancer screening result.  PERTINENT  PMH / PSH: PMHx reviewed  OBJECTIVE:   Vitals:   09/08/21 0843 09/08/21 0858  BP: (!) 149/73 (!) 159/75  Pulse: 65   SpO2: 100%   Weight: 148 lb 6 oz (67.3 kg)   Height: '4\' 11"'$  (1.499 m)     Physical Exam Vitals and nursing note reviewed.  Cardiovascular:     Rate and Rhythm: Normal rate and regular rhythm.     Heart sounds: Normal heart sounds. No murmur heard.    No gallop.  Pulmonary:     Effort: Pulmonary effort is normal. No respiratory distress.     Breath sounds: Normal breath sounds. No wheezing.      ASSESSMENT/PLAN:   Hypertension BP is elevated. ?? Anxiety from discussing her CT lung result. Continue current regimen. F/U in 4 weeks for reassessment. She agreed with the plan.   Tobacco use disorder CT lungs reviewed and discussed with her. No lung cancer or nodules. + Coronary calcification and pulmonary HTN Continue ASA and Zetia - Statin intolerance BP control discussed. Consider cards or pulm referral in the future if symptoms worsens. She agreed with the plan.  Coronary artery calcification seen on CAT scan + Coronary calcification and pulmonary HTN Continue ASA and Zetia - Statin intolerance BP control discussed. Consider cards referral in the future if symptoms worsens. She agreed with the plan.  Pulmonary hypertension, primary ([place]) Pulm HTN seen on CT lungs. Continue ASA and Zetia - Statin intolerance BP control discussed. Consider cards or pulm referral in the future if symptoms worsens. She agreed with the plan.   NB: She does not wish to get any  vaccination including flu, covid-19 of shingrix. She want's this off her chart.  Andrena Mews, MD [place]

## 2021-09-08 NOTE — Assessment & Plan Note (Signed)
BP is elevated. ?? Anxiety from discussing her CT lung result. Continue current regimen. F/U in 4 weeks for reassessment. She agreed with the plan.

## 2021-09-08 NOTE — Patient Instructions (Signed)
Pulmonary Hypertension Pulmonary hypertension is a long-term (chronic) condition in which there is high blood pressure in the blood vessels of the lungs (pulmonary arteries). This condition occurs when pulmonary arteries become narrow and tight, making it harder for blood to flow through the lungs. This in turn makes the heart work harder to pump blood through the lungs, making it harder for you to breathe. Over time, pulmonary hypertension can weaken and damage the heart muscle, specifically the right side of the heart. Pulmonary hypertension is a serious condition that can be life-threatening. What are the causes? This condition may be caused by different medical conditions. It can be categorized by cause into five groups: Group 1: Pulmonary hypertension that is caused when the arteries in the lungs become narrowed, thickened, or stiff (pulmonary arterial hypertension). This may happen with no known cause, or it may be: Passed from parent to child (hereditary). Caused by another disease, such as a connective tissue disease (including lupus or scleroderma), congenital heart disease, liver disease, or HIV (human immunodeficiency virus). Caused by certain medicines or poisons. Group 2: Pulmonary hypertension that is caused by weakness of the left chamber of the heart (left ventricle) or heart valve disease. Group 3: Pulmonary hypertension that is caused by chronic lung disease or low oxygen levels. Causes in this group include: Emphysema or chronic obstructive pulmonary disease (COPD). Untreated sleep apnea. Pulmonary fibrosis. Long-term exposure to high altitudes in certain people who may already be at higher risk for pulmonary hypertension. Group 4: Pulmonary hypertension that is caused by blood clots in the lungs. Group 5: Other causes of pulmonary hypertension, such as sickle cell anemia, sarcoidosis, abnormal growths of tissue (tumors) pressing on the pulmonary arteries, and various other  diseases. What are the signs or symptoms? Symptoms of this condition include: Shortness of breath. You may notice shortness of breath with: Activity, such as walking. Minimal activity, such as getting dressed. No activity, like when you are sitting still. A cough. Sometimes, bloody mucus from the lungs may be coughed up. Tiredness. Dizziness, light-headedness, or fainting, especially with physical activity. Rapid heartbeat, or feeling your heart flutter or skip a beat (palpitations). Veins in the neck getting larger. Swelling of the lower legs, abdomen, or both. Bluish color of the lips and fingertips. Chest pain or tightness. Abdominal pain, especially in the upper abdomen. How is this diagnosed? This condition may be diagnosed based on one or more of the following tests: Blood tests. Imaging tests, such as: Chest X-ray. CT scan. Echocardiogram. This test uses sound waves (ultrasound) to produce an image of the heart. Ventilation-perfusion scan. This test uses radioactive material to test how well air moves and blood flows in and out of the lungs. Pulmonary function test. This test measures how much air your lungs can hold. It also tests how well air moves in and out of your lungs. 6-minute walk test. This tests how severe your condition is in relation to your activity levels. ECG (electrocardiogram). This test records the electrical impulses of the heart. Cardiac catheterization. This is a procedure in which a thin tube (catheter) is passed into the pulmonary artery and used to test the pressure in your pulmonary artery and the right side of your heart. Lung biopsy. This involves having a procedure to remove a small sample of lung tissue for testing. This may help determine an underlying cause of your pulmonary hypertension. How is this treated? There is no cure for this condition, but treatment can help to relieve symptoms and   slow the progress of the condition. Treatment may  include: Cardiac rehabilitation. This is a treatment program that includes exercise training, education, and counseling to help you get stronger and return to an active lifestyle. Oxygen therapy. Medicines that: Lower blood pressure. Relax (dilate) the pulmonary blood vessels. Help the heart beat more efficiently and pump more blood. Help the body get rid of extra fluid. Thin the blood in order to prevent blood clots in the lungs. Lung surgery to relieve pressure on the heart. This may be needed for severe cases that do not respond to medical treatment. Heart-lung transplant, or lung transplant. This may be done in very severe cases. Follow these instructions at home: Eating and drinking  Eat a healthy diet that includes plenty of fresh fruits and vegetables, whole grains, and beans. Limit your salt (sodium) intake to less than 2,300 mg a day. Activity Get plenty of rest. Exercise as directed. Talk with your health care provider about what type of exercise is safe for you. Avoid sitting in hot tubs or saunas for long periods of time. Avoid high altitudes. Lifestyle Do not use any products that contain nicotine or tobacco. These products include cigarettes, chewing tobacco, and vaping devices, such as e-cigarettes. If you need help quitting, ask your health care provider. Avoid secondhand smoke. General instructions Take over-the-counter and prescription medicines only as told by your health care provider. Do not change or stop medicines without checking with your health care provider. Stay up to date on your vaccines, especially yearly flu (influenza) and pneumonia vaccines. If you are a woman of child-bearing age, avoid becoming pregnant. Talk with your health care provider about birth control. Consider ways to get support for the anxiety and stress of living with pulmonary hypertension. Talk with your health care provider about support groups and online resources. Use oxygen therapy at  home as directed. Keep track of your weight. Weight gain could be a sign that your condition is getting worse. Keep all follow-up visits. This is important. Contact a health care provider if: Your cough gets worse. You have more shortness of breath than usual, or you start to have trouble doing activities that you could do before. You need to use medicines or oxygen more frequently or in higher dosages than usual. Get help right away if: You have severe shortness of breath. You have chest pain or pressure. You cough up blood. You have swelling of your feet or legs that gets worse. You have rapid weight gain over a period of 1-2 days. Your medicines or oxygen do not provide relief. These symptoms may represent a serious problem that is an emergency. Do not wait to see if the symptoms will go away. Get medical help right away. Call your local emergency services (911 in the [place] Do not drive yourself to the hospital. Summary Pulmonary hypertension is a long-term (chronic) condition in which there is high blood pressure in the blood vessels of the lungs (pulmonary arteries). Pulmonary hypertension is a serious condition that can be life-threatening. It can be caused by a variety of illnesses. Treatment may involve taking medicines and using oxygen therapy. Severe cases may require surgery or a transplant. This information is not intended to replace advice given to you by your health care provider. Make sure you discuss any questions you have with your health care provider. Document Revised: 11/05/2019 Document Reviewed: 11/05/2019 Elsevier Patient Education  2023 Elsevier Inc.  

## 2021-09-08 NOTE — Assessment & Plan Note (Signed)
CT lungs reviewed and discussed with her. No lung cancer or nodules. + Coronary calcification and pulmonary HTN Continue ASA and Zetia - Statin intolerance BP control discussed. Consider cards or pulm referral in the future if symptoms worsens. She agreed with the plan.

## 2021-09-08 NOTE — Assessment & Plan Note (Signed)
Pulm HTN seen on CT lungs. Continue ASA and Zetia - Statin intolerance BP control discussed. Consider cards or pulm referral in the future if symptoms worsens. She agreed with the plan.

## 2021-09-08 NOTE — Assessment & Plan Note (Signed)
+   Coronary calcification and pulmonary HTN Continue ASA and Zetia - Statin intolerance BP control discussed. Consider cards referral in the future if symptoms worsens. She agreed with the plan.

## 2021-10-06 ENCOUNTER — Ambulatory Visit (INDEPENDENT_AMBULATORY_CARE_PROVIDER_SITE_OTHER): Payer: Medicare Other | Admitting: Family Medicine

## 2021-10-06 ENCOUNTER — Encounter: Payer: Self-pay | Admitting: Family Medicine

## 2021-10-06 ENCOUNTER — Telehealth: Payer: Self-pay

## 2021-10-06 VITALS — BP 141/60 | HR 64 | Ht 59.0 in | Wt 146.2 lb

## 2021-10-06 DIAGNOSIS — I5032 Chronic diastolic (congestive) heart failure: Secondary | ICD-10-CM | POA: Diagnosis not present

## 2021-10-06 DIAGNOSIS — I270 Primary pulmonary hypertension: Secondary | ICD-10-CM

## 2021-10-06 DIAGNOSIS — J449 Chronic obstructive pulmonary disease, unspecified: Secondary | ICD-10-CM | POA: Diagnosis not present

## 2021-10-06 DIAGNOSIS — M549 Dorsalgia, unspecified: Secondary | ICD-10-CM

## 2021-10-06 DIAGNOSIS — I10 Essential (primary) hypertension: Secondary | ICD-10-CM

## 2021-10-06 DIAGNOSIS — G8929 Other chronic pain: Secondary | ICD-10-CM

## 2021-10-06 MED ORDER — ALBUTEROL SULFATE HFA 108 (90 BASE) MCG/ACT IN AERS
INHALATION_SPRAY | RESPIRATORY_TRACT | 3 refills | Status: DC
Start: 2021-10-06 — End: 2021-10-08

## 2021-10-06 MED ORDER — MONTELUKAST SODIUM 10 MG PO TABS: 10.0000 mg | ORAL_TABLET | Freq: Every day | ORAL | 3 refills | Status: DC

## 2021-10-06 MED ORDER — ALBUTEROL SULFATE (2.5 MG/3ML) 0.083% IN NEBU: 2.5000 mg | INHALATION_SOLUTION | Freq: Four times a day (QID) | RESPIRATORY_TRACT | 3 refills | Status: DC | PRN

## 2021-10-06 MED ORDER — LOSARTAN POTASSIUM 50 MG PO TABS: 50.0000 mg | ORAL_TABLET | Freq: Every day | ORAL | 0 refills | Status: DC

## 2021-10-06 MED ORDER — BACLOFEN 10 MG PO TABS: ORAL_TABLET | ORAL | 0 refills | Status: DC

## 2021-10-06 NOTE — Assessment & Plan Note (Signed)
ECHO ordered 

## 2021-10-06 NOTE — Assessment & Plan Note (Addendum)
Lumbar image reviewed and discussed with her. She has DJD. Continue Ibuprofen prn. She has not been using anything for pain prior to today. Start Baclofen prn. Consider MRI spine in the future if there is no improvement.  PT referral placed.

## 2021-10-06 NOTE — Assessment & Plan Note (Signed)
BP improved. Given chronic worsening cough, we discussed trial of switching Lisinopril to ARBs. Perhaps, this might be contributing to her cough. She agreed with the plan. D/Ced Lisinopril and switched to Losartan 50 mg QD. F/U in 4 weeks for reassessment and meds adjustment.

## 2021-10-06 NOTE — Assessment & Plan Note (Signed)
Becoming more symptomatic since season change. I refilled her albuterol and started her on nightly Singulair considering possible allergic component. Referred to Pulm for co-management give associated pulm HTN

## 2021-10-06 NOTE — Patient Instructions (Signed)
Losartan; Hydrochlorothiazide Tablets What is this medication? LOSARTAN; HYDROCHLOROTHIAZIDE (loe SAR tan; hye droe klor oh THYE a zide) treats high blood pressure. It may also be used to prevent a stroke in people with heart disease and high blood pressure. It relaxes your blood vessels and helps your kidneys remove more fluid through the urine, which lowers blood pressure. It is a combination of an ARB and diuretic. This medicine may be used for other purposes; ask your health care provider or pharmacist if you have questions. COMMON BRAND NAME(S): Hyzaar What should I tell my care team before I take this medication? They need to know if you have any of these conditions: Decreased urine Diabetes If you are on a special diet, like a low-salt diet Immune system problems, like lupus Kidney disease Liver disease An unusual or allergic reaction to losartan, hydrochlorothiazide, other medications, foods, dyes, or preservatives Pregnant or trying to get pregnant Breast-feeding How should I use this medication? Take this medication by mouth. Take it as directed on the prescription label at the same time every day. You can take it with or without food. If it upsets your stomach, take it with food. Keep taking it unless your care team tells you to stop. Talk to your care team about the use of this medication in children. Special care may be needed. Overdosage: If you think you have taken too much of this medicine contact a poison control center or emergency room at once. NOTE: This medicine is only for you. Do not share this medicine with others. What if I miss a dose? If you miss a dose, take it as soon as you can. If it is almost time for your next dose, take only that dose. Do not take double or extra doses. What may interact with this medication? Do not take this medication with any of the following: Cidofovir Dofetilide Tranylcypromine This medication may also interact with the  following: Barbiturates, like phenobarbital Blood pressure medications Celecoxib Diuretics, especially triamterene, spironolactone or amiloride Fluconazole Lithium Medications for diabetes Medications that relax the muscles for surgery NSAIDs, medications for pain and inflammation, like ibuprofen or naproxen Opioid medications for pain Potassium salts or potassium supplements Rifampin Some cholesterol-lowering medications like cholestyramine or colestipol Steroid medications like prednisone or cortisone This list may not describe all possible interactions. Give your health care provider a list of all the medicines, herbs, non-prescription drugs, or dietary supplements you use. Also tell them if you smoke, drink alcohol, or use illegal drugs. Some items may interact with your medicine. What should I watch for while using this medication? Check your blood pressure regularly while you are taking this medication. Ask your care team what your blood pressure should be and when you should contact them. When you check your blood pressure, write down the measurements to show your care team. If you are taking this medication for a long time, you must visit your care team for regular checks on your progress. Make sure you schedule appointments on a regular basis. You must not get dehydrated. Ask your care team how much fluid you need to drink a day. Check with them if you get an attack of severe diarrhea, nausea and vomiting, or if you sweat a lot. The loss of too much body fluid can make it dangerous for you to take this medication. Talk to your care team if you wish to become pregnant or think you might be pregnant. This medication can cause serious birth defects. This medication may  affect your coordination, reaction time, or judgment. Do not drive or operate machinery until you know how this medication affects you. Sit up or stand slowly to reduce the risk of dizzy or fainting spells. Drinking alcohol  with this medication can increase the risk of these side effects. This medication may increase blood sugar. Ask your care team if changes in diet or medications are needed if you have diabetes. Talk to your care team about your risk of skin cancer. You may be more at risk for skin cancer if you take this medication. This medication can make you more sensitive to the sun. Keep out of the sun. If you cannot avoid being in the sun, wear protective clothing and use sunscreen. Do not use sun lamps or tanning beds/booths. Avoid salt substitutes unless you are told otherwise by your care team. Do not treat yourself for coughs, colds, or pain while you are taking this medication without asking your care team for advice. Some ingredients may increase your blood pressure. What side effects may I notice from receiving this medication? Side effects that you should report to your care team as soon as possible: Allergic reactions--skin rash, itching, hives, swelling of the face, lips, tongue, or throat Dehydration--increased thirst, dry mouth, feeling faint or lightheaded, headache, dark yellow or brown urine Gout--severe pain, redness, warmth, or swelling in joints, such as the big toe Kidney injury--decrease in the amount of urine, swelling of the ankles, hands, or feet Low blood pressure--dizziness, feeling faint or lightheaded, blurry vision Low potassium level--muscle pain or cramps, unusual weakness or fatigue, fast or irregular heartbeat, constipation Sudden eye pain or change in vision such as blurry vision, seeing halos around lights, vision loss Side effects that usually do not require medical attention (report to your care team if they continue or are bothersome): Change in sex drive or performance Dizziness Headache Runny or stuffy nose Upset stomach This list may not describe all possible side effects. Call your doctor for medical advice about side effects. You may report side effects to FDA at  1-800-FDA-1088. Where should I keep my medication? Keep out of the reach of children and pets. Store at room temperature between 15 and 30 degrees C (59 and 86 degrees F). Protect from light. Keep the container tightly closed. Throw away any unused medication after the expiration date. NOTE: This sheet is a summary. It may not cover all possible information. If you have questions about this medicine, talk to your doctor, pharmacist, or health care provider.  2023 Elsevier/Gold Standard (2020-11-05 00:00:00)

## 2021-10-06 NOTE — Telephone Encounter (Signed)
Spoke with patient. Informed of ECHO at [facility] on 10/11 at 3:00. Patient understood and wrote down the information. Salvatore Marvel, CMA

## 2021-10-06 NOTE — Telephone Encounter (Signed)
-----   Message from [name], MD sent at 10/06/2021  9:13 AM EDT ----- Hello team, please contact patient to help her schedule an ECHO appointment. Thanks.

## 2021-10-06 NOTE — Assessment & Plan Note (Signed)
ECHO ordered for further evaluation. I also referred her to [facility] as she wants this to be further evaluated and managed.

## 2021-10-06 NOTE — Progress Notes (Addendum)
    SUBJECTIVE:   CHIEF COMPLAINT / HPI:   COPD: The patient is coughing more. The cough is worse at nighttime. She needed to use her albuterol more often than not. No other symptoms.  Pulm HTN: The patient is concerned about this and wishes to discuss the next steps. Other than worsening cough and wheezing, there are no other concerns.  HTN:  She is compliant with her Lisinopril 20 mg QD. She is here for a follow-up.  Back pain: Low back pain worsened. No LL weakness or numbness. She will like to treated.  PERTINENT  PMH / PSH: PMHx reviewed  OBJECTIVE:   Vitals:   10/06/21 0820 10/06/21 0835  BP: (!) 141/66 (!) 141/60  Pulse: 64   SpO2: 100%   Weight: 146 lb 3.2 oz (66.3 kg)   Height: '4\' 11"'$  (1.499 m)     Physical Exam Vitals and nursing note reviewed.  Cardiovascular:     Rate and Rhythm: Normal rate and regular rhythm.     Heart sounds: Normal heart sounds. No murmur heard. Pulmonary:     Effort: Pulmonary effort is normal. No respiratory distress.     Breath sounds: Normal breath sounds. No wheezing.  Abdominal:     General: Bowel sounds are normal. There is no distension.     Palpations: Abdomen is soft. There is no mass.     Tenderness: There is no abdominal tenderness.  Musculoskeletal:     Right lower leg: No edema.     Left lower leg: No edema.  Neurological:     Mental Status: She is alert.      ASSESSMENT/PLAN:   COPD (chronic obstructive pulmonary disease) ([place]) Becoming more symptomatic since season change. I refilled her albuterol and started her on nightly Singulair considering possible allergic component. Referred to Pulm for co-management give associated pulm HTN  Pulmonary hypertension, primary ([place]) ECHO ordered for further evaluation. I also referred her to [facility] as she wants this to be further evaluated and managed.  Diastolic CHF ([place]) ECHO ordered  Hypertension BP improved. Given chronic worsening cough, we discussed trial of  switching Lisinopril to ARBs. Perhaps, this might be contributing to her cough. She agreed with the plan. D/Ced Lisinopril and switched to Losartan 50 mg QD. F/U in 4 weeks for reassessment and meds adjustment.   Chronic back pain Lumbar image reviewed and discussed with her. She has DJD. Continue Ibuprofen prn. She has not been using anything for pain prior to today. Start Baclofen prn. Consider MRI spine in the future if there is no improvement.  PT referral placed.     Andrena Mews, MD [place]

## 2021-10-07 LAB — MICROALBUMIN / CREATININE URINE RATIO
Creatinine, Urine: 20.5 mg/dL
Microalb/Creat Ratio: <15 mg/g creat (ref 0–29)
Microalbumin, Urine: <3 ug/mL

## 2021-10-08 ENCOUNTER — Other Ambulatory Visit: Payer: Self-pay

## 2021-10-08 MED ORDER — ALBUTEROL SULFATE HFA 108 (90 BASE) MCG/ACT IN AERS: INHALATION_SPRAY | RESPIRATORY_TRACT | 3 refills | Status: DC

## 2021-10-14 ENCOUNTER — Ambulatory Visit (HOSPITAL_COMMUNITY)
Admission: RE | Admit: 2021-10-14 | Discharge: 2021-10-14 | Disposition: A | Discharge status: Home / Self Care | Payer: Medicare Other | Source: Ambulatory Visit | Attending: Family Medicine | Admitting: Family Medicine

## 2021-10-14 DIAGNOSIS — I5032 Chronic diastolic (congestive) heart failure: Secondary | ICD-10-CM | POA: Insufficient documentation

## 2021-10-14 DIAGNOSIS — I272 Pulmonary hypertension, unspecified: Secondary | ICD-10-CM | POA: Diagnosis not present

## 2021-10-14 DIAGNOSIS — R0602 Shortness of breath: Secondary | ICD-10-CM | POA: Diagnosis not present

## 2021-10-14 DIAGNOSIS — I110 Hypertensive heart disease with heart failure: Secondary | ICD-10-CM | POA: Diagnosis not present

## 2021-10-14 DIAGNOSIS — F17210 Nicotine dependence, cigarettes, uncomplicated: Secondary | ICD-10-CM | POA: Diagnosis not present

## 2021-10-14 DIAGNOSIS — R0600 Dyspnea, unspecified: Secondary | ICD-10-CM | POA: Diagnosis not present

## 2021-10-14 DIAGNOSIS — E118 Type 2 diabetes mellitus with unspecified complications: Secondary | ICD-10-CM | POA: Diagnosis not present

## 2021-10-14 LAB — ECHOCARDIOGRAM COMPLETE
Area-P 1/2: 4.53 cm2
Calc EF: 45.8 %
S' Lateral: 3.45 cm
Single Plane A2C EF: 43.7 %
Single Plane A4C EF: 45.5 %

## 2021-10-14 NOTE — Progress Notes (Signed)
  Echocardiogram 2D Echocardiogram has been performed.  Betty Chapman 10/14/2021, 3:43 PM

## 2021-10-15 ENCOUNTER — Telehealth: Payer: Self-pay | Admitting: Family Medicine

## 2021-10-15 NOTE — Telephone Encounter (Signed)
ECHO discussed EF reduced DD improved No definitive Pulm HTN, although CT chest suggestive of it. She has appointment with pulm for COPD and review CT report of Pulm HTN. F/U as planned.

## 2021-10-19 ENCOUNTER — Other Ambulatory Visit: Payer: Self-pay

## 2021-10-19 MED ORDER — ALBUTEROL SULFATE (2.5 MG/3ML) 0.083% IN NEBU: 2.5000 mg | INHALATION_SOLUTION | Freq: Four times a day (QID) | RESPIRATORY_TRACT | 3 refills | Status: DC | PRN

## 2021-11-02 ENCOUNTER — Institutional Professional Consult (permissible substitution): Payer: Medicare Other | Admitting: Pulmonary Disease

## 2021-11-03 ENCOUNTER — Encounter: Payer: Self-pay | Admitting: Family Medicine

## 2021-11-03 ENCOUNTER — Encounter: Payer: Self-pay | Admitting: Pulmonary Disease

## 2021-11-03 ENCOUNTER — Ambulatory Visit (INDEPENDENT_AMBULATORY_CARE_PROVIDER_SITE_OTHER): Payer: Medicare Other | Admitting: Pulmonary Disease

## 2021-11-03 ENCOUNTER — Telehealth: Payer: Self-pay | Admitting: Family Medicine

## 2021-11-03 ENCOUNTER — Ambulatory Visit (INDEPENDENT_AMBULATORY_CARE_PROVIDER_SITE_OTHER): Payer: Medicare Other | Admitting: Family Medicine

## 2021-11-03 VITALS — BP 176/72 | HR 67 | Ht 59.0 in | Wt 148.2 lb

## 2021-11-03 DIAGNOSIS — J449 Chronic obstructive pulmonary disease, unspecified: Secondary | ICD-10-CM

## 2021-11-03 DIAGNOSIS — F17210 Nicotine dependence, cigarettes, uncomplicated: Secondary | ICD-10-CM | POA: Diagnosis not present

## 2021-11-03 DIAGNOSIS — I10 Essential (primary) hypertension: Secondary | ICD-10-CM

## 2021-11-03 DIAGNOSIS — Z716 Tobacco abuse counseling: Secondary | ICD-10-CM

## 2021-11-03 DIAGNOSIS — E1169 Type 2 diabetes mellitus with other specified complication: Secondary | ICD-10-CM

## 2021-11-03 LAB — POCT GLYCOSYLATED HEMOGLOBIN (HGB A1C): HbA1c, POC (controlled diabetic range): 6.4 % (ref 0.0–7.0)

## 2021-11-03 MED ORDER — PANTOPRAZOLE SODIUM 40 MG PO TBEC: 40.0000 mg | DELAYED_RELEASE_TABLET | Freq: Every day | ORAL | 3 refills | Status: DC

## 2021-11-03 MED ORDER — LOSARTAN POTASSIUM 100 MG PO TABS: 100.0000 mg | ORAL_TABLET | Freq: Every day | ORAL | 1 refills | Status: DC

## 2021-11-03 MED ORDER — ALBUTEROL SULFATE HFA 108 (90 BASE) MCG/ACT IN AERS: 1.0000 | INHALATION_SPRAY | Freq: Four times a day (QID) | RESPIRATORY_TRACT | 1 refills | Status: DC | PRN

## 2021-11-03 NOTE — Assessment & Plan Note (Signed)
Increased Losartan to 100 mg QD. She will take two tablets of her current dose of 50 mg. I encouraged her to monitor BP closely at home with a BP range of 120/60 to 140/90 She agreed with the plan.

## 2021-11-03 NOTE — Telephone Encounter (Signed)
She mentioned she received a letter from her insurance regarding her albuterol. Unfortunately she did not come to the clinic with this letter. I called her to check on this letter. Unfortunately, she was unable to provide adequate information. I suspect that her ventolin need to be switched to proventil.  Medication escribed.  She may come in with the letter when able to.

## 2021-11-03 NOTE — Patient Instructions (Signed)
Nice to meet you  To treat the cough, take pantoprazole 1 pill in the about 30 minutes prior to breakfast.  This is to more aggressively treat acid reflux which can cause cough particularly at night.  Continue the [facility] and Spiriva as you are.  I think is a good regimen for the COPD and the breathing.  Return to clinic in 3 months or sooner if needed with Dr. Silas Flood

## 2021-11-03 NOTE — Patient Instructions (Signed)
It was nice seeing your BP is still elevated. Please, go up on your Losartan to 100 mg QD, I.e take two tablet of your 50 mg Tablets and I will send in the 100 mg. Please, start checking your BP closely at home.  I will see you back for your annual wellness appointment.

## 2021-11-03 NOTE — Progress Notes (Signed)
    SUBJECTIVE:   CHIEF COMPLAINT / HPI:   HTN: She is compliant with Losartan 50 mg daily which is a switch from her Lisinopril 20 mg QD. Cough is about the same since switch. We will watch for improvement of her cough. She does not check her BP at home although she has a BP machine. No other concerns.   DM2: Diet controlled. Here for follow up.   PERTINENT  PMH / PSH: PMHx reviewed.  Vitals:   11/03/21 0823 11/03/21 0850  BP: (!) 167/78 (!) 176/72  Pulse: 67   SpO2: 100%   Weight: 148 lb 4 oz (67.2 kg)   Height: '4\' 11"'$  (1.499 m)       Physical Exam Vitals and nursing note reviewed.  Cardiovascular:     Rate and Rhythm: Normal rate and regular rhythm.     Pulses: Normal pulses.     Heart sounds: Normal heart sounds. No murmur heard. Pulmonary:     Effort: Pulmonary effort is normal. No respiratory distress.     Breath sounds: Normal breath sounds. No wheezing.  Abdominal:     General: Abdomen is flat. Bowel sounds are normal. There is no distension.     Palpations: Abdomen is soft.     Tenderness: There is no abdominal tenderness.  Musculoskeletal:     Right lower leg: No edema.     Left lower leg: No edema.      ASSESSMENT/PLAN:   Hypertension Increased Losartan to 100 mg QD. She will take two tablets of her current dose of 50 mg. I encouraged her to monitor BP closely at home with a BP range of 120/60 to 140/90 She agreed with the plan.  Diabetes mellitus A1C looks good. Recheck in 6 months.      Andrena Mews, MD [place]

## 2021-11-03 NOTE — Assessment & Plan Note (Signed)
A1C looks good. Recheck in 6 months.

## 2021-11-04 NOTE — Progress Notes (Signed)
_0  ID: Betty Chapman, female    DOB: 19-Oct-1960, 61 y.o.   MRN: [id number]  Chief Complaint  Patient presents with   Consult    Pt is here for consult for COPD and pulm htn. Pt states she has had copd since 2014. Pt states she just found out about pulm htn 3 months ago. Pt states she is on dulera and sprivia daily. Pt states that both inhalers are working well for her. Heart ultrasound was earlier this month. Pt has not seen any swelling in feet or hands. She is losing weight due to her getting healthy in regards to her diabetes.     Referring provider: [name], MD  HPI:   61 y.o. woman whom are seen in consultation for evaluation of COPD and dyspnea on exertion.  Note from referring provider reviewed.  Patient has been undergoing CT lung cancer screening via PCP.  Recently 08/19/2021.  A moderate interpretation reveals emphysematous changes that are mild primarily in the upper lobes, and scattered thickened bronchioles.  There is mention of mildly enlarged pulmonary artery.  She had TTE 10/7-23 that reveals EF of around 99%, grade 1 diastolic dysfunction, normal RA size, normal RV size, normal RV function, normal estimated PASP.  She had PFTs in 2019 that demonstrate severe COPD.  Overall, she thinks inhalers work well for her.  She is on high-dose Dulera and Spiriva.  When she misses doses she no significant worsening in breathing.  Denies any significant history of exacerbations.  No recent prednisone use outpatient.  Her dyspnea is worse on inclines or stairs.  At time of day when things are better or worse.  No position make things better or worse.  No environmental seasonal factors she can identify that make things better or worse.  No other relieving or exacerbating factors.  PMH: GERD, hypertension, asthma Surgical history: Cholecystectomy, tubal ligation Social history: Current everyday smoker, lives in [place] Family history: Mother with brain aneurysm,  brother with prostate cancer  Questionaires / Pulmonary Flowsheets:   ACT:      No data to display          MMRC:     No data to display          Epworth:      No data to display          Tests:   FENO:  No results found for: "NITRICOXIDE"  PFT:     No data to display          WALK:      No data to display          Imaging: Personally reviewed and as per EMR and discussion in this note ECHOCARDIOGRAM COMPLETE  Result Date: 10/14/2021    ECHOCARDIOGRAM REPORT   Patient Name:   Betty Chapman Date of Exam: 10/14/2021 Medical Rec #:  [id number]           Height:       59.0 in Accession #:    [id number]          Weight:       146.2 lb Date of Birth:  1960/08/07           BSA:          1.614 m Patient Age:    61 years            BP:           168/86 mmHg Patient Gender: F  HR:           69 bpm. Exam Location:  Outpatient Procedure: 2D Echo, 3D Echo, Color Doppler, Cardiac Doppler and Strain Analysis Indications:    I50.40* Unspecified combined systolic (congestive) and diastolic                 (congestive) heart failure  History:        Patient has prior history of Echocardiogram examinations, most                 recent 09/20/2017. CHF, Pulmonary HTN, Signs/Symptoms:Shortness                 of Breath and Dyspnea; Risk Factors:Current Smoker, Hypertension                 and Diabetes.  Sonographer:    Roseanna Rainbow RDCS Referring Phys: [id number] [name]  Sonographer Comments: Technically difficult study due to poor echo windows. Image acquisition challenging due to COPD. Global longitudinal strain was attempted. IMPRESSIONS  1. Left ventricular ejection fraction by 3D volume is 46 %. The left ventricle has mildly decreased function. The left ventricle demonstrates regional wall motion abnormalities (see scoring diagram/findings for description). Left ventricular diastolic parameters are consistent with Grade I diastolic dysfunction (impaired  relaxation). The average left ventricular global longitudinal strain is -11.9 %. The global longitudinal strain is abnormal.  2. Right ventricular systolic function is normal. The right ventricular size is normal.  3. The mitral valve is normal in structure. No evidence of mitral valve regurgitation. No evidence of mitral stenosis.  4. The aortic valve is normal in structure. Aortic valve regurgitation is not visualized. No aortic stenosis is present.  5. The inferior vena cava is normal in size with greater than 50% respiratory variability, suggesting right atrial pressure of 3 mmHg. FINDINGS  Left Ventricle: Left ventricular ejection fraction by 3D volume is 46 %. The left ventricle has mildly decreased function. The left ventricle demonstrates regional wall motion abnormalities. The average left ventricular global longitudinal strain is -11.9 %. The global longitudinal strain is abnormal. The left ventricular internal cavity size was normal in size. There is no left ventricular hypertrophy. Left ventricular diastolic parameters are consistent with Grade I diastolic dysfunction (impaired  relaxation).  LV Wall Scoring: The posterior wall is akinetic. Right Ventricle: The right ventricular size is normal. No increase in right ventricular wall thickness. Right ventricular systolic function is normal. Left Atrium: Left atrial size was normal in size. Right Atrium: Right atrial size was normal in size. Pericardium: There is no evidence of pericardial effusion. Mitral Valve: The mitral valve is normal in structure. No evidence of mitral valve regurgitation. No evidence of mitral valve stenosis. Tricuspid Valve: The tricuspid valve is normal in structure. Tricuspid valve regurgitation is not demonstrated. No evidence of tricuspid stenosis. Aortic Valve: The aortic valve is normal in structure. Aortic valve regurgitation is not visualized. No aortic stenosis is present. Pulmonic Valve: The pulmonic valve was normal in  structure. Pulmonic valve regurgitation is not visualized. No evidence of pulmonic stenosis. Aorta: The aortic root is normal in size and structure. Venous: The inferior vena cava is normal in size with greater than 50% respiratory variability, suggesting right atrial pressure of 3 mmHg. IAS/Shunts: No atrial level shunt detected by color flow Doppler.  LEFT VENTRICLE PLAX 2D LVIDd:         4.33 cm         Diastology LVIDs:  3.45 cm         LV e' medial:    8.59 cm/s LV PW:         0.90 cm         LV E/e' medial:  7.7 LV IVS:        0.93 cm         LV e' lateral:   9.14 cm/s LVOT diam:     2.20 cm         LV E/e' lateral: 7.2 LV SV:         45 LV SV Index:   28              2D LVOT Area:     3.80 cm        Longitudinal                                Strain                                2D Strain GLS  -11.9 % LV Volumes (MOD)               Avg: LV vol d, MOD    68.2 ml A2C:                           3D Volume EF LV vol d, MOD    67.2 ml       LV 3D EF:    Left A4C:                                        ventricul LV vol s, MOD    38.4 ml                    ar A2C:                                        ejection LV vol s, MOD    36.6 ml                    fraction A4C:                                        by 3D LV SV MOD A2C:   29.8 ml                    volume is LV SV MOD A4C:   67.2 ml                    46 %. LV SV MOD BP:    32.4 ml                                 3D Volume EF:                                3D  EF:        46 %                                LV EDV:       98 ml                                LV ESV:       53 ml                                LV SV:        45 ml RIGHT VENTRICLE            IVC RV S prime:     9.14 cm/s  IVC diam: 1.40 cm TAPSE (M-mode): 1.7 cm LEFT ATRIUM             Index        RIGHT ATRIUM           Index LA diam:        3.30 cm 2.04 cm/m   RA Area:     13.20 cm LA Vol (A2C):   25.4 ml 15.73 ml/m  RA Volume:   33.50 ml  20.75 ml/m LA Vol (A4C):   19.0 ml 11.77  ml/m LA Biplane Vol: 23.5 ml 14.56 ml/m  AORTIC VALVE LVOT Vmax:   68.40 cm/s LVOT Vmean:  39.700 cm/s LVOT VTI:    0.118 m  AORTA Ao Root diam: 3.30 cm MITRAL VALVE MV Area (PHT): 4.53 cm    SHUNTS MV Decel Time: 168 msec    Systemic VTI:  0.12 m MV E velocity: 66.20 cm/s  Systemic Diam: 2.20 cm MV A velocity: 71.50 cm/s MV E/A ratio:  0.93 Candee Furbish MD Electronically signed by Candee Furbish MD Signature Date/Time: 10/14/2021/4:26:09 PM    Final     Lab Results: Personally reviewed CBC    Component Value Date/Time   WBC 9.3 07/13/2014 1820   RBC 4.95 07/13/2014 1820   HGB 15.7 (H) 07/13/2014 1820   HCT 46.2 (H) 07/13/2014 1820   PLT 314 07/13/2014 1820   MCV 93.3 07/13/2014 1820   MCH 31.7 07/13/2014 1820   MCHC 34.0 07/13/2014 1820   RDW 13.7 07/13/2014 1820   LYMPHSABS 3.3 02/20/2014 1931   MONOABS 0.5 02/20/2014 1931   EOSABS 0.1 02/20/2014 1931   BASOSABS 0.0 02/20/2014 1931    BMET    Component Value Date/Time   NA 144 02/27/2021 0842   K 4.5 02/27/2021 0842   CL 106 02/27/2021 0842   CO2 25 02/27/2021 0842   GLUCOSE 82 02/27/2021 0842   GLUCOSE 124 (H) 07/13/2014 1820   BUN 10 02/27/2021 0842   CREATININE 0.60 02/27/2021 0842   CALCIUM 9.1 02/27/2021 0842   GFRNONAA 101 12/18/2018 0858   GFRAA 117 12/18/2018 0858    BNP No results found for: "BNP"  ProBNP No results found for: "PROBNP"  Specialty Problems       Pulmonary Problems   COPD (chronic obstructive pulmonary disease) (HCC)    Allergies  Allergen Reactions   Mushroom Extract Complex Anaphylaxis and Rash   Pravastatin Sodium Hives and Itching   Statins     Hives    Immunization History  Administered Date(s) Administered   Influenza,inj,Quad PF,6+ Mos  11/10/2017, 09/01/2018   Pneumococcal Polysaccharide-23 11/10/2017   Td 01/04/2001   Tdap 07/04/2018    Past Medical History:  Diagnosis Date   Blurry vision, bilateral 03/20/2014   Callus of foot 07/16/2014   COPD (chronic obstructive  pulmonary disease) ([place])    COVID-19 virus infection    Dysphagia, pharyngoesophageal phase    Epidermal cyst of face 07/16/2014   Hypercholesterolemia    Hypertension    TIA (transient ischemic attack)    Once   Tobacco abuse    Unintended weight loss 02/06/2019    Tobacco History: Social History   Tobacco Use  Smoking Status Every Day   Packs/day: 1.00   Years: 47.00   Total pack years: 47.00   Types: Cigarettes   Start date: 01/04/1970  Smokeless Tobacco Never  Tobacco Comments   1 pack per day max    Ready to quit: Not Answered Counseling given: Not Answered Tobacco comments: 1 pack per day max    Continue to not smoke  Outpatient Encounter Medications as of 11/03/2021  Medication Sig   albuterol (PROVENTIL) (2.5 MG/3ML) 0.083% nebulizer solution Take 3 mLs (2.5 mg total) by nebulization every 6 (six) hours as needed for wheezing or shortness of breath.   aspirin 81 MG tablet Take 1 tablet (81 mg total) by mouth daily.   baclofen (LIORESAL) 10 MG tablet 1-2 times daily as needed for pain and muscle tightness   betamethasone valerate (VALISONE) 0.1 % cream Apply topically 2 (two) times daily. Apply to vulva area. Avoid use if there is a break in skin.   blood glucose meter kit and supplies KIT Accu-Chek Aviva Plus Use up to TID daily as directed. (FOR ICD-9 250.00, 250.01).   ezetimibe (ZETIA) 10 MG tablet TAKE 1 TABLET BY MOUTH  DAILY   famotidine (PEPCID) 10 MG tablet Take 10 mg by mouth 2 (two) times daily.   ibuprofen (ADVIL) 400 MG tablet Take 1 tablet (400 mg total) by mouth every 8 (eight) hours as needed for moderate pain.   losartan (COZAAR) 100 MG tablet Take 1 tablet (100 mg total) by mouth daily.   mometasone-formoterol (DULERA) 200-5 MCG/ACT AERO USE 2 INHALATIONS BY MOUTH  TWICE DAILY   montelukast (SINGULAIR) 10 MG tablet Take 1 tablet (10 mg total) by mouth at bedtime.   Omega-3 Fatty Acids (FISH OIL) 1000 MG CAPS Take 1 capsule by mouth 2 (two) times  daily.   pantoprazole (PROTONIX) 40 MG tablet Take 1 tablet (40 mg total) by mouth daily.   tiotropium (SPIRIVA HANDIHALER) 18 MCG inhalation capsule INHALE THE CONTENTS OF 1  CAPSULE BY MOUTH VIA  HANDIHALER DAILY   [DISCONTINUED] albuterol (VENTOLIN HFA) 108 (90 Base) MCG/ACT inhaler USE 2 INHALATIONS BY MOUTH  EVERY 6 HOURS AS NEEDED FOR WHEEZING OR SHORTNESS OF  BREATH   No facility-administered encounter medications on file as of 11/03/2021.     Review of Systems  Review of Systems  No chest pain with exertion.  No orthopnea or PND.  Comprehensive review of systems otherwise negative. Physical Exam  Pulse 80   Ht _0  (1.499 m)   Wt 143 lb (64.9 kg)   LMP 12/27/2011   SpO2 97%   BMI 28.88 kg/m   Wt Readings from Last 5 Encounters:  11/03/21 143 lb (64.9 kg)  11/03/21 148 lb 4 oz (67.2 kg)  10/06/21 146 lb 3.2 oz (66.3 kg)  09/08/21 148 lb 6 oz (67.3 kg)  07/21/21 143 lb 2 oz (64.9  kg)    BMI Readings from Last 5 Encounters:  11/03/21 28.88 kg/m  11/03/21 29.94 kg/m  10/06/21 29.53 kg/m  09/08/21 29.97 kg/m  07/21/21 28.91 kg/m     Physical Exam General: Sitting in chair, no acute distress Eyes: EOMI, no icterus Neck: Supple, JVP Pulmonary: Distant, clear, normal work of breathing Abdomen: Nontender, bowel sounds present Cardiovascular: Warm, no edema MSK: No synovitis, no joint effusion Neuro: Normal gait, no weakness Psych: Normal mood, full affect   Assessment & Plan:   Dyspnea on exertion: Likely multifactorial related to severe COPD and prior spirometry, heart failure with EF 45% and chronotropic/inotropic incompetence.  Recent TTE without evidence of elevated PASP here signs of RV dysfunction etc.  Reassuring in terms of lack of pulmonary hypertension.  Symptoms worsen when does not use inhaler therapy.  Continue therapy for COPD as below.  Severe COPD: Gold B.  Based on PFTs, spirometry 11/2017, no bronchodilator spots.  Continue triple  inhaled therapy via high-dose Dulera and Spiriva.  Continue albuterol as needed.  No recent exacerbations.  Smoking assessment and cessation counseling I have advised the patient to quit/stop smoking as soon as possible due to high risk for multiple medical problems.  It will also be very difficult for [place] to manage patient's  respiratory symptoms and status if we continue to expose her lungs to a known irritant.  We do not advise e-cigarettes as a form of stopping smoking. Patient is not willing to quit smoking. I have advised the patient that we can assist and have options of nicotine replacement therapy, provided smoking cessation education today, provided smoking cessation counseling, and provided cessation resources. Follow-up next office visit office visit for assessment of smoking cessation.  I spent 5 minutes in tobacco cessation counseling. -- Continue enrollment in low-dose CT scan yearly for lung cancer screening   Return in about 3 months (around 02/03/2022).   Lanier Clam, MD 11/04/2021   This appointment required 68 minutes of patient care (this includes precharting, chart review, review of results, face-to-face care, etc.).

## 2021-11-09 DIAGNOSIS — M545 Low back pain, unspecified: Secondary | ICD-10-CM | POA: Diagnosis not present

## 2021-11-09 DIAGNOSIS — M791 Myalgia, unspecified site: Secondary | ICD-10-CM | POA: Diagnosis not present

## 2021-11-09 DIAGNOSIS — M6281 Muscle weakness (generalized): Secondary | ICD-10-CM | POA: Diagnosis not present

## 2021-11-09 DIAGNOSIS — M2560 Stiffness of unspecified joint, not elsewhere classified: Secondary | ICD-10-CM | POA: Diagnosis not present

## 2021-11-11 DIAGNOSIS — M6281 Muscle weakness (generalized): Secondary | ICD-10-CM | POA: Diagnosis not present

## 2021-11-11 DIAGNOSIS — M791 Myalgia, unspecified site: Secondary | ICD-10-CM | POA: Diagnosis not present

## 2021-11-11 DIAGNOSIS — M545 Low back pain, unspecified: Secondary | ICD-10-CM | POA: Diagnosis not present

## 2021-11-11 DIAGNOSIS — M2560 Stiffness of unspecified joint, not elsewhere classified: Secondary | ICD-10-CM | POA: Diagnosis not present

## 2021-11-16 DIAGNOSIS — M545 Low back pain, unspecified: Secondary | ICD-10-CM | POA: Diagnosis not present

## 2021-11-16 DIAGNOSIS — M791 Myalgia, unspecified site: Secondary | ICD-10-CM | POA: Diagnosis not present

## 2021-11-16 DIAGNOSIS — M6281 Muscle weakness (generalized): Secondary | ICD-10-CM | POA: Diagnosis not present

## 2021-11-16 DIAGNOSIS — M2560 Stiffness of unspecified joint, not elsewhere classified: Secondary | ICD-10-CM | POA: Diagnosis not present

## 2021-11-17 ENCOUNTER — Encounter: Payer: Self-pay | Admitting: Family Medicine

## 2021-11-17 ENCOUNTER — Ambulatory Visit (INDEPENDENT_AMBULATORY_CARE_PROVIDER_SITE_OTHER): Payer: Medicare Other | Admitting: Family Medicine

## 2021-11-17 VITALS — BP 141/76 | HR 72 | Ht 59.0 in | Wt 147.1 lb

## 2021-11-17 DIAGNOSIS — Z0000 Encounter for general adult medical examination without abnormal findings: Secondary | ICD-10-CM | POA: Diagnosis not present

## 2021-11-17 MED ORDER — LOSARTAN POTASSIUM 100 MG PO TABS: 100.0000 mg | ORAL_TABLET | Freq: Every day | ORAL | 1 refills | Status: DC

## 2021-11-17 MED ORDER — TIOTROPIUM BROMIDE MONOHYDRATE 18 MCG IN CAPS: ORAL_CAPSULE | RESPIRATORY_TRACT | 1 refills | Status: DC

## 2021-11-17 MED ORDER — EZETIMIBE 10 MG PO TABS: 10.0000 mg | ORAL_TABLET | Freq: Every day | ORAL | 1 refills | Status: DC

## 2021-11-17 MED ORDER — MONTELUKAST SODIUM 10 MG PO TABS: 10.0000 mg | ORAL_TABLET | Freq: Every day | ORAL | 1 refills | Status: DC

## 2021-11-17 MED ORDER — DULERA 200-5 MCG/ACT IN AERO: INHALATION_SPRAY | RESPIRATORY_TRACT | 3 refills | Status: DC

## 2021-11-17 MED ORDER — ASPIRIN 81 MG PO TBEC: 81.0000 mg | DELAYED_RELEASE_TABLET | Freq: Every day | ORAL | 1 refills | Status: AC

## 2021-11-17 NOTE — Addendum Note (Signed)
Addended by: Andrena Mews T on: 11/17/2021 03:47 PM   Modules accepted: Orders

## 2021-11-17 NOTE — Progress Notes (Signed)
Subjective:   Betty Chapman is a 61 y.o. female who presents for Medicare Annual (Subsequent) preventive examination.  Review of Systems    As in the body of history       Objective:    Today's Vitals   11/17/21 1058 11/17/21 1142  BP: (!) 161/79 (!) 141/76  Pulse: 89 72  SpO2: 100%   Weight: 147 lb 2 oz (66.7 kg)   Height: _0  (1.499 m)   PainSc: 8    PainLoc: Back    Body mass index is 29.72 kg/m.  Physical Exam Vitals and nursing note reviewed.  Constitutional:      Appearance: Normal appearance.  HENT:     Right Ear: Tympanic membrane normal.     Left Ear: Tympanic membrane normal.     Mouth/Throat:     Mouth: Mucous membranes are moist.  Eyes:     Conjunctiva/sclera: Conjunctivae normal.  Cardiovascular:     Rate and Rhythm: Normal rate and regular rhythm.     Heart sounds: Normal heart sounds. No murmur heard. Pulmonary:     Effort: Pulmonary effort is normal. No respiratory distress.     Breath sounds: Normal breath sounds.  Abdominal:     General: Bowel sounds are normal. There is no distension.     Palpations: Abdomen is soft.     Tenderness: There is no abdominal tenderness.  Musculoskeletal:     Right lower leg: No edema.     Left lower leg: No edema.  Neurological:     Mental Status: She is oriented to person, place, and time.  Psychiatric:        Behavior: Behavior normal.        11/17/2021   10:57 AM 11/03/2021    8:22 AM 10/06/2021    8:22 AM 09/08/2021    8:42 AM 07/21/2021   10:28 AM 04/28/2021    9:44 AM 02/27/2021    8:26 AM  Advanced Directives  Does Patient Have a Medical Advance Directive? _1  No No  Would patient like information on creating a medical advance directive? No - Patient declined No - Patient declined No - Patient declined No - Patient declined No - Patient declined No - Patient declined No - Patient declined    Current Medications (verified) Outpatient Encounter Medications as of 11/17/2021   Medication Sig   aspirin 81 MG tablet Take 1 tablet (81 mg total) by mouth daily.   baclofen (LIORESAL) 10 MG tablet 1-2 times daily as needed for pain and muscle tightness   betamethasone valerate (VALISONE) 0.1 % cream Apply topically 2 (two) times daily. Apply to vulva area. Avoid use if there is a break in skin.   ezetimibe (ZETIA) 10 MG tablet TAKE 1 TABLET BY MOUTH  DAILY   famotidine (PEPCID) 10 MG tablet Take 10 mg by mouth 2 (two) times daily.   ibuprofen (ADVIL) 400 MG tablet Take 1 tablet (400 mg total) by mouth every 8 (eight) hours as needed for moderate pain.   losartan (COZAAR) 100 MG tablet Take 1 tablet (100 mg total) by mouth daily.   mometasone-formoterol (DULERA) 200-5 MCG/ACT AERO USE 2 INHALATIONS BY MOUTH  TWICE DAILY   montelukast (SINGULAIR) 10 MG tablet Take 1 tablet (10 mg total) by mouth at bedtime.   Omega-3 Fatty Acids (FISH OIL) 1000 MG CAPS Take 1 capsule by mouth 2 (two) times daily.   pantoprazole (PROTONIX) 40 MG tablet Take 1 tablet (40 mg  total) by mouth daily.   tiotropium (SPIRIVA HANDIHALER) 18 MCG inhalation capsule INHALE THE CONTENTS OF 1  CAPSULE BY MOUTH VIA  HANDIHALER DAILY   albuterol (PROVENTIL) (2.5 MG/3ML) 0.083% nebulizer solution Take 3 mLs (2.5 mg total) by nebulization every 6 (six) hours as needed for wheezing or shortness of breath. (Patient not taking: Reported on 11/17/2021)   albuterol (VENTOLIN HFA) 108 (90 Base) MCG/ACT inhaler Inhale 1-2 puffs into the lungs every 6 (six) hours as needed for wheezing or shortness of breath. (Patient not taking: Reported on 11/17/2021)   blood glucose meter kit and supplies KIT Accu-Chek Aviva Plus Use up to TID daily as directed. (FOR ICD-9 250.00, 250.01).   No facility-administered encounter medications on file as of 11/17/2021.    Allergies (verified) Mushroom extract complex, Pravastatin sodium, and Statins   History: Past Medical History:  Diagnosis Date   Blurry vision, bilateral  03/20/2014   Callus of foot 07/16/2014   COPD (chronic obstructive pulmonary disease) ([place])    COVID-19 virus infection    Dysphagia, pharyngoesophageal phase    Epidermal cyst of face 07/16/2014   Hypercholesterolemia    Hypertension    TIA (transient ischemic attack)    Once   Tobacco abuse    Unintended weight loss 02/06/2019   Past Surgical History:  Procedure Laterality Date   BTL     CHOLECYSTECTOMY     COLONOSCOPY N/A 06/10/2014   Procedure: COLONOSCOPY;  Surgeon: Daneil Dolin, MD;  [facility];  Service: Endoscopy;  Laterality: N/A;  1315   ECTOPIC PREGNANCY SURGERY     ESOPHAGOGASTRODUODENOSCOPY N/A 06/10/2014   Procedure: ESOPHAGOGASTRODUODENOSCOPY (EGD);  Surgeon: Daneil Dolin, MD;  [facility]: [facility];  Service: Endoscopy;  Laterality: N/A;   MALONEY DILATION N/A 06/10/2014   Procedure: Venia Minks DILATION;  Surgeon: Daneil Dolin, MD;  Location: [facility];  Service: Endoscopy;  Laterality: N/A;   TUBAL LIGATION     Family History  Problem Relation Age of Onset   Aneurysm Mother        brain, cause of death   Cancer Brother        Dies of cancer, unsure what primary site   Prostate cancer Brother    Hypertension Daughter    Diabetes Daughter    Colon cancer Neg Hx    Breast cancer Neg Hx    Social History   Socioeconomic History   Marital status: Divorced    Spouse name: Not on file   Number of children: 2   Years of education: 12   Highest education level: 12th grade  Occupational History   Not on file  Tobacco Use   Smoking status: Every Day    Packs/day: 1.00    Years: 47.00    Total pack years: 47.00    Types: Cigarettes    Start date: 01/04/1970   Smokeless tobacco: Never   Tobacco comments:    1 pack per day max   Vaping Use   Vaping Use: Never used  Substance and Sexual Activity   Alcohol use: Yes    Alcohol/week: 0.0 standard drinks of alcohol    Comment: rarely    Drug use: Yes    Types: Marijuana    Comment: Occasionally    Sexual activity: Not Currently    Birth control/protection: Surgical  Other Topics Concern   Not on file  Social History Narrative   Patient lives with her brother and two dogs.    Patient  is divorced and has 2 children.    Patient enjoys gardening and playing with her dogs.    Social Determinants of Health   Financial Resource Strain: Low Risk  (09/30/2020)   Overall Financial Resource Strain (CARDIA)    Difficulty of Paying Living Expenses: Not hard at all  Food Insecurity: No Food Insecurity (11/17/2021)   Hunger Vital Sign    Worried About Running Out of Food in the Last Year: Never true    Ran Out of Food in the Last Year: Never true  Transportation Needs: No Transportation Needs (11/17/2021)   PRAPARE - Hydrologist (Medical): No    Lack of Transportation (Non-Medical): No  Physical Activity: Insufficiently Active (11/17/2021)   Exercise Vital Sign    Days of Exercise per Week: 4 days    Minutes of Exercise per Session: 30 min  Stress: No Stress Concern Present (09/30/2020)   [place]    Feeling of Stress : Only a little  Social Connections: Socially Isolated (11/17/2021)   Social Connection and Isolation Panel [NHANES]    Frequency of Communication with Friends and Family: More than three times a week    Frequency of Social Gatherings with Friends and Family: Never    Attends Religious Services: Never    Marine scientist or Organizations: No    Attends Music therapist: Never    Marital Status: Divorced    Tobacco Counseling Ready to quit: Yes Counseling given: Yes Tobacco comments: 1 pack per day max    Clinical Intake:  Pre-visit preparation completed: Yes  Pain scale: Zero  Nutritional Status: BMI 25 -29 Overweight Nutritional Risks: None Diabetes: Yes (Diet controlled DM)  How often do you need to have someone help you when you read  instructions, pamphlets, or other written materials from your doctor or pharmacy?: 1 - Never What is the last grade level you completed in school?: 12TH GRADE  Diabetic?Diet controlled  Interpreter Needed?: No    Activities of Daily Living    11/17/2021   11:32 AM  In your present state of health, do you have any difficulty performing the following activities:  Hearing? 0  Vision? 1  Comment She wears glasses for driving > farsightedness  Difficulty concentrating or making decisions? 0  Walking or climbing stairs? 1  Comment Sometimes  Dressing or bathing? 0  Doing errands, shopping? 0    Patient Care Team: [name], MD as PCP - General (Family Medicine) Gala Romney Cristopher Estimable, MD as Consulting Physician (Gastroenterology)  Indicate any recent Medical Services you may have received from other than [facility] providers in the past year (date may be approximate).     Assessment:   This is a routine wellness examination for [facility].  Hearing/Vision screen Hearing Screening   _0  _1  _2  _3  _4   Right ear _5   Left ear _6   Vision Screening - Comments:: Patient left glasses in car.   Dietary issues and exercise activities discussed:     Goals Addressed               This Visit's Progress     COMPLETED: Quit Smoking        Quit Smoking (pt-stated)        I am working on quitting, but still smokes about the same.       Depression Screen    11/17/2021  11:27 AM 11/03/2021    8:22 AM 10/06/2021    8:21 AM 09/08/2021    8:44 AM 07/21/2021   10:29 AM 04/28/2021    9:45 AM 02/27/2021    8:27 AM  PHQ 2/9 Scores  PHQ - 2 Score 0 0 0 0 0 0 0  PHQ- 9 Score 0 _0 0 4    Fall Risk    11/17/2021   11:31 AM 09/30/2020   11:09 AM 03/23/2019    8:35 AM 02/06/2019    8:31 AM 08/11/2018    8:30 AM  Fall Risk   Falls in the past year? 1 0 0 0 0  Number falls in past yr: 0   0   Injury with Fall? 0      Risk  for fall due to : No Fall Risks No Fall Risks     Follow up  Falls prevention discussed       FALL RISK PREVENTION PERTAINING TO THE HOME:  Any stairs in or around the home? No  If so, are there any without handrails? No  Home free of loose throw rugs in walkways, pet beds, electrical cords, etc? No  Adequate lighting in your home to reduce risk of falls? Yes   ASSISTIVE DEVICES UTILIZED TO PREVENT FALLS:  Life alert? No  Use of a cane, walker or w/c? No  Grab bars in the bathroom? No  Shower chair or bench in shower? No  Elevated toilet seat or a handicapped toilet? No   TIMED UP AND GO:  Was the test performed? Yes .  Length of time to ambulate 10 feet: 12 sec.   Gait steady and fast without use of assistive device  Cognitive Function:        09/30/2020   11:11 AM  6CIT Screen  What Year? 0 points  What month? 0 points  What time? 0 points  Count back from 20 0 points  Months in reverse 0 points  Repeat phrase 0 points  Total Score 0 points    Immunizations Immunization History  Administered Date(s) Administered   Influenza,inj,Quad PF,6+ Mos 11/10/2017, 09/01/2018   Pneumococcal Polysaccharide-23 11/10/2017   Td 01/04/2001   Tdap 07/04/2018    TDAP status: Up to date  Flu Vaccine status: Declined, Education has been provided regarding the importance of this vaccine but patient still declined. Advised may receive this vaccine at local pharmacy or Health Dept. Aware to provide a copy of the vaccination record if obtained from local pharmacy or Health Dept. Verbalized acceptance and understanding.  Pneumococcal vaccine status: Up to date  Covid-19 vaccine status: Declined, Education has been provided regarding the importance of this vaccine but patient still declined. Advised may receive this vaccine at local pharmacy or Health Dept.or vaccine clinic. Aware to provide a copy of the vaccination record if obtained from local pharmacy or Health Dept. Verbalized  acceptance and understanding.  Qualifies for Shingles Vaccine? No   Zostavax completed No   Shingrix Completed?: No.    Education has been provided regarding the importance of this vaccine. Patient has been advised to call insurance company to determine out of pocket expense if they have not yet received this vaccine. Advised may also receive vaccine at local pharmacy or Health Dept. Verbalized acceptance and understanding.  Screening Tests Health Maintenance  Topic Date Due   Medicare Annual Wellness (AWV)  09/30/2021   COVID-19 Vaccine (1) 04/02/2022 (Originally 10/19/1960)   INFLUENZA VACCINE  04/04/2022 (Originally  08/04/2021)   OPHTHALMOLOGY EXAM  02/04/2022   Diabetic kidney evaluation - GFR measurement  02/27/2022   HEMOGLOBIN A1C  05/04/2022   FOOT EXAM  07/22/2022   Lung Cancer Screening  08/19/2022   Diabetic kidney evaluation - Urine ACR  10/07/2022   MAMMOGRAM  06/25/2023   PAP SMEAR-Modifier  04/23/2024   COLONOSCOPY (Pts 45-34yr Insurance coverage will need to be confirmed)  06/09/2024   TETANUS/TDAP  07/03/2028   Hepatitis C Screening  Completed   HIV Screening  Completed   HPV VACCINES  Aged Out   Zoster Vaccines- Shingrix  Discontinued    Health Maintenance  Health Maintenance Due  Topic Date Due   Medicare Annual Wellness (AWV)  09/30/2021    Colorectal cancer screening: Type of screening: Colonoscopy. Completed 06/09/17. Repeat every  years per her gastroenterologist.  Mammogram status: Completed 06/24/21. Repeat every year  Bone Density status: Completed 11/09/17. Results reflect: Bone density results: NORMAL. Repeat every 3-5 years.  Lung Cancer Screening: (Low Dose CT Chest recommended if Age 61-80years, 30 pack-year currently smoking OR have quit w/in 15years.) does qualify.   Lung Cancer Screening Referral: Completed on 08/18/21  Additional Screening:  Hepatitis C Screening: does qualify; Completed 08/06/16  Vision Screening: Recommended annual  ophthalmology exams for early detection of glaucoma and other disorders of the eye. Is the patient up to date with their annual eye exam?  Yes  Who is the provider or what is the name of the office in which the patient attends annual eye exams? Dr. PAlois ClicheIf pt is not established with a provider, would they like to be referred to a provider to establish care? NA.   Dental Screening: Recommended annual dental exams for proper oral hygiene. [place] Referral / Chronic Care Management: CRR required this visit?  No   CCM required this visit?  N/A     Plan:     I have personally reviewed and noted the following in the patient's chart:   Medical and social history Use of alcohol, tobacco or illicit drugs  Current medications and supplements including opioid prescriptions. Patient is not currently taking opioid prescriptions. Functional ability and status Nutritional status Physical activity Advanced directives List of other physicians Hospitalizations, surgeries, and ER visits in previous 12 months Vitals Screenings to include cognitive, depression, and falls Referrals and appointments  In addition, I have reviewed and discussed with patient certain preventive protocols, quality metrics, and best practice recommendations. A written personalized care plan for preventive services as well as general preventive health recommendations were provided to patient.     KAndrena Mews MD   11/17/2021

## 2021-11-17 NOTE — Patient Instructions (Signed)
Preventive Care 40-61 Years Old, Female Preventive care refers to lifestyle choices and visits with your health care provider that can promote health and wellness. Preventive care visits are also called wellness exams. What can I expect for my preventive care visit? Counseling Your health care provider may ask you questions about your: Medical history, including: Past medical problems. Family medical history. Pregnancy history. Current health, including: Menstrual cycle. Method of birth control. Emotional well-being. Home life and relationship well-being. Sexual activity and sexual health. Lifestyle, including: Alcohol, nicotine or tobacco, and drug use. Access to firearms. Diet, exercise, and sleep habits. Work and work environment. Sunscreen use. Safety issues such as seatbelt and bike helmet use. Physical exam Your health care provider will check your: Height and weight. These may be used to calculate your BMI (body mass index). BMI is a measurement that tells if you are at a healthy weight. Waist circumference. This measures the distance around your waistline. This measurement also tells if you are at a healthy weight and may help predict your risk of certain diseases, such as type 2 diabetes and high blood pressure. Heart rate and blood pressure. Body temperature. Skin for abnormal spots. What immunizations do I need?  Vaccines are usually given at various ages, according to a schedule. Your health care provider will recommend vaccines for you based on your age, medical history, and lifestyle or other factors, such as travel or where you work. What tests do I need? Screening Your health care provider may recommend screening tests for certain conditions. This may include: Lipid and cholesterol levels. Diabetes screening. This is done by checking your blood sugar (glucose) after you have not eaten for a while (fasting). Pelvic exam and Pap test. Hepatitis B test. Hepatitis C  test. HIV (human immunodeficiency virus) test. STI (sexually transmitted infection) testing, if you are at risk. Lung cancer screening. Colorectal cancer screening. Mammogram. Talk with your health care provider about when you should start having regular mammograms. This may depend on whether you have a family history of breast cancer. BRCA-related cancer screening. This may be done if you have a family history of breast, ovarian, tubal, or peritoneal cancers. Bone density scan. This is done to screen for osteoporosis. Talk with your health care provider about your test results, treatment options, and if necessary, the need for more tests. Follow these instructions at home: Eating and drinking  Eat a diet that includes fresh fruits and vegetables, whole grains, lean protein, and low-fat dairy products. Take vitamin and mineral supplements as recommended by your health care provider. Do not drink alcohol if: Your health care provider tells you not to drink. You are pregnant, may be pregnant, or are planning to become pregnant. If you drink alcohol: Limit how much you have to 0-1 drink a day. Know how much alcohol is in your drink. In the [place] one drink equals one 12 oz bottle of beer (355 mL), one 5 oz glass of wine (148 mL), or one 1 oz glass of hard liquor (44 mL). Lifestyle Brush your teeth every morning and night with fluoride toothpaste. Floss one time each day. Exercise for at least 30 minutes 5 or more days each week. Do not use any products that contain nicotine or tobacco. These products include cigarettes, chewing tobacco, and vaping devices, such as e-cigarettes. If you need help quitting, ask your health care provider. Do not use drugs. If you are sexually active, practice safe sex. Use a condom or other form of protection to   prevent STIs. If you do not wish to become pregnant, use a form of birth control. If you plan to become pregnant, see your health care provider for a  prepregnancy visit. Take aspirin only as told by your health care provider. Make sure that you understand how much to take and what form to take. Work with your health care provider to find out whether it is safe and beneficial for you to take aspirin daily. Find healthy ways to manage stress, such as: Meditation, yoga, or listening to music. Journaling. Talking to a trusted person. Spending time with friends and family. Minimize exposure to UV radiation to reduce your risk of skin cancer. Safety Always wear your seat belt while driving or riding in a vehicle. Do not drive: If you have been drinking alcohol. Do not ride with someone who has been drinking. When you are tired or distracted. While texting. If you have been using any mind-altering substances or drugs. Wear a helmet and other protective equipment during sports activities. If you have firearms in your house, make sure you follow all gun safety procedures. Seek help if you have been physically or sexually abused. What's next? Visit your health care provider once a year for an annual wellness visit. Ask your health care provider how often you should have your eyes and teeth checked. Stay up to date on all vaccines. This information is not intended to replace advice given to you by your health care provider. Make sure you discuss any questions you have with your health care provider. Document Revised: 06/18/2020 Document Reviewed: 06/18/2020 Elsevier Patient Education  [place].

## 2021-11-18 DIAGNOSIS — M545 Low back pain, unspecified: Secondary | ICD-10-CM | POA: Diagnosis not present

## 2021-11-18 DIAGNOSIS — M791 Myalgia, unspecified site: Secondary | ICD-10-CM | POA: Diagnosis not present

## 2021-11-18 DIAGNOSIS — M2560 Stiffness of unspecified joint, not elsewhere classified: Secondary | ICD-10-CM | POA: Diagnosis not present

## 2021-11-18 DIAGNOSIS — M6281 Muscle weakness (generalized): Secondary | ICD-10-CM | POA: Diagnosis not present

## 2021-11-25 DIAGNOSIS — M791 Myalgia, unspecified site: Secondary | ICD-10-CM | POA: Diagnosis not present

## 2021-11-25 DIAGNOSIS — M6281 Muscle weakness (generalized): Secondary | ICD-10-CM | POA: Diagnosis not present

## 2021-11-25 DIAGNOSIS — M2560 Stiffness of unspecified joint, not elsewhere classified: Secondary | ICD-10-CM | POA: Diagnosis not present

## 2021-11-25 DIAGNOSIS — M545 Low back pain, unspecified: Secondary | ICD-10-CM | POA: Diagnosis not present

## 2021-12-01 DIAGNOSIS — M2560 Stiffness of unspecified joint, not elsewhere classified: Secondary | ICD-10-CM | POA: Diagnosis not present

## 2021-12-01 DIAGNOSIS — M6281 Muscle weakness (generalized): Secondary | ICD-10-CM | POA: Diagnosis not present

## 2021-12-01 DIAGNOSIS — M545 Low back pain, unspecified: Secondary | ICD-10-CM | POA: Diagnosis not present

## 2021-12-01 DIAGNOSIS — M791 Myalgia, unspecified site: Secondary | ICD-10-CM | POA: Diagnosis not present

## 2021-12-02 DIAGNOSIS — M791 Myalgia, unspecified site: Secondary | ICD-10-CM | POA: Diagnosis not present

## 2021-12-02 DIAGNOSIS — M2560 Stiffness of unspecified joint, not elsewhere classified: Secondary | ICD-10-CM | POA: Diagnosis not present

## 2021-12-02 DIAGNOSIS — M6281 Muscle weakness (generalized): Secondary | ICD-10-CM | POA: Diagnosis not present

## 2021-12-02 DIAGNOSIS — M545 Low back pain, unspecified: Secondary | ICD-10-CM | POA: Diagnosis not present

## 2021-12-09 DIAGNOSIS — M6281 Muscle weakness (generalized): Secondary | ICD-10-CM | POA: Diagnosis not present

## 2021-12-09 DIAGNOSIS — M791 Myalgia, unspecified site: Secondary | ICD-10-CM | POA: Diagnosis not present

## 2021-12-09 DIAGNOSIS — M2560 Stiffness of unspecified joint, not elsewhere classified: Secondary | ICD-10-CM | POA: Diagnosis not present

## 2021-12-09 DIAGNOSIS — M545 Low back pain, unspecified: Secondary | ICD-10-CM | POA: Diagnosis not present

## 2021-12-15 DIAGNOSIS — M545 Low back pain, unspecified: Secondary | ICD-10-CM | POA: Diagnosis not present

## 2021-12-15 DIAGNOSIS — M791 Myalgia, unspecified site: Secondary | ICD-10-CM | POA: Diagnosis not present

## 2021-12-15 DIAGNOSIS — M6281 Muscle weakness (generalized): Secondary | ICD-10-CM | POA: Diagnosis not present

## 2021-12-15 DIAGNOSIS — M2560 Stiffness of unspecified joint, not elsewhere classified: Secondary | ICD-10-CM | POA: Diagnosis not present

## 2021-12-22 DIAGNOSIS — M2560 Stiffness of unspecified joint, not elsewhere classified: Secondary | ICD-10-CM | POA: Diagnosis not present

## 2021-12-22 DIAGNOSIS — M791 Myalgia, unspecified site: Secondary | ICD-10-CM | POA: Diagnosis not present

## 2021-12-22 DIAGNOSIS — M545 Low back pain, unspecified: Secondary | ICD-10-CM | POA: Diagnosis not present

## 2021-12-22 DIAGNOSIS — M6281 Muscle weakness (generalized): Secondary | ICD-10-CM | POA: Diagnosis not present

## 2021-12-24 DIAGNOSIS — M6281 Muscle weakness (generalized): Secondary | ICD-10-CM | POA: Diagnosis not present

## 2021-12-24 DIAGNOSIS — M2560 Stiffness of unspecified joint, not elsewhere classified: Secondary | ICD-10-CM | POA: Diagnosis not present

## 2021-12-24 DIAGNOSIS — M545 Low back pain, unspecified: Secondary | ICD-10-CM | POA: Diagnosis not present

## 2021-12-24 DIAGNOSIS — M791 Myalgia, unspecified site: Secondary | ICD-10-CM | POA: Diagnosis not present

## 2021-12-31 DIAGNOSIS — M2560 Stiffness of unspecified joint, not elsewhere classified: Secondary | ICD-10-CM | POA: Diagnosis not present

## 2021-12-31 DIAGNOSIS — M791 Myalgia, unspecified site: Secondary | ICD-10-CM | POA: Diagnosis not present

## 2021-12-31 DIAGNOSIS — M6281 Muscle weakness (generalized): Secondary | ICD-10-CM | POA: Diagnosis not present

## 2021-12-31 DIAGNOSIS — M545 Low back pain, unspecified: Secondary | ICD-10-CM | POA: Diagnosis not present

## 2022-01-07 ENCOUNTER — Ambulatory Visit (INDEPENDENT_AMBULATORY_CARE_PROVIDER_SITE_OTHER): Payer: Medicare Other | Admitting: Family Medicine

## 2022-01-07 ENCOUNTER — Other Ambulatory Visit: Payer: Self-pay

## 2022-01-07 ENCOUNTER — Encounter: Payer: Self-pay | Admitting: Family Medicine

## 2022-01-07 ENCOUNTER — Telehealth: Payer: Self-pay

## 2022-01-07 VITALS — BP 161/79 | HR 83 | Wt 148.2 lb

## 2022-01-07 DIAGNOSIS — I10 Essential (primary) hypertension: Secondary | ICD-10-CM

## 2022-01-07 DIAGNOSIS — J449 Chronic obstructive pulmonary disease, unspecified: Secondary | ICD-10-CM

## 2022-01-07 MED ORDER — PREDNISONE 20 MG PO TABS: 40.0000 mg | ORAL_TABLET | Freq: Every day | ORAL | 0 refills | Status: DC

## 2022-01-07 MED ORDER — AMLODIPINE BESYLATE 5 MG PO TABS: 5.0000 mg | ORAL_TABLET | Freq: Every day | ORAL | 3 refills | Status: DC

## 2022-01-07 MED ORDER — AMLODIPINE BESYLATE 5 MG PO TABS: 2.5000 mg | ORAL_TABLET | Freq: Every day | ORAL | 3 refills | Status: DC

## 2022-01-07 MED ORDER — ACETAMINOPHEN-CODEINE 300-30 MG PO TABS: 1.0000 | ORAL_TABLET | Freq: Three times a day (TID) | ORAL | 0 refills | Status: AC | PRN

## 2022-01-07 MED ORDER — IPRATROPIUM-ALBUTEROL 0.5-2.5 (3) MG/3ML IN SOLN: 3.0000 mL | RESPIRATORY_TRACT | 0 refills | Status: DC | PRN

## 2022-01-07 MED ORDER — BENZONATATE 100 MG PO CAPS: 100.0000 mg | ORAL_CAPSULE | Freq: Three times a day (TID) | ORAL | 0 refills | Status: DC | PRN

## 2022-01-07 NOTE — Patient Instructions (Addendum)
It was great to see you today! Thank you for choosing [facility] for your primary care. [name] was seen for COPD exacerbation.  Today we addressed: COPD Exacerbation: I prescribed a 5 day course of steroids as well as some as needed medications to help with your cough.  Blood pressure - Your blood pressure was very elevated. It should not be able to get that high even with cough. I am starting a medication called amlodipine. You will need to follow up in a month to make sure that you are doing well with this medication.   If you haven't already, sign up for My Chart to have easy access to your labs results, and communication with your primary care physician.  We are checking some labs today. If they are abnormal, I will call you. If they are normal, I will send you a MyChart message (if it is active) or a letter in the mail. If you do not hear about your labs in the next 2 weeks, please call the office.   You should return to our clinic 1/23 at 8:30 am.   I recommend that you always bring your medications to each appointment as this makes it easy to ensure you are on the correct medications and helps [place] not miss refills when you need them.  Please arrive 15 minutes before your appointment to ensure smooth check in process.  We appreciate your efforts in making this happen.  Please call the clinic at [phone] if your symptoms worsen or you have any concerns.  Thank you for allowing me to participate in your care, Lowry Ram, MD 01/07/2022, 10:44 AM PGY-1, [facility]

## 2022-01-07 NOTE — Assessment & Plan Note (Signed)
BP very elevated today. Initially 606 systolic due to coughing. On recheck slightly improved at 161/79, but still above goal. Last three clinic appointments blood pressure has been increasing. Patient notes that BP at home is usually 301S systolic.  - Advised patient to bring in BP log  - Continue losartan  - Start amlodipine 2.5 and F/u w/ Dr. [name] on 1/23

## 2022-01-07 NOTE — Assessment & Plan Note (Signed)
Most likely has current acute exacerbation with viral illness. Not severe in that patient is afebrile without increased sputum or hypoxia. She is not tachypneic and is stable though very uncomfortable at this time. She has kept up with daily inhalers, but does not take her singulair daily due to pill burden.  - Reiterated importance of daily singulair as a preventative and antiinflammatory  - Advised to take duoneb in place of albuterol rescue during acute illness  - Will follow up with Dr. [name] (PCP) on 1/23

## 2022-01-07 NOTE — Telephone Encounter (Signed)
Patient calls nurse line reporting a chronic cough and possible COPD flare.   She reports for the last several weeks she has been coughing so much she has become incontinent. She reports she is getting very little sleep.   She denies any fevers and reports a negative covid test this morning.   Patient scheduled for evaluation this morning.

## 2022-01-07 NOTE — Progress Notes (Signed)
    SUBJECTIVE:   CHIEF COMPLAINT / HPI:   Cough  Reports worsening of her chronic cough in last several weeks causing her to lose sleep. Negative home COVID test. Has had more rhinorrhea but not coughing up more phlegm. Has had streaks of blood. Has not been using any nasal sprays. Color of phlegm has not changed. Says no change of appetite or decrease of po intake. No diarrhea.   PERTINENT  PMH / PSH: COPD, HTN   OBJECTIVE:   BP (!) 161/79   Pulse 83   Wt 148 lb 3.2 oz (67.2 kg)   LMP 12/27/2011   SpO2 95%   BMI 29.93 kg/m   General: ill appearing though in no acute distress CV: tachycardic but regular, radial pulses equal and palpable, no BLE edema  Resp: Slightly increased work of breathing on room air, not tachypneic, No wheezes while breathing, does have wheezes with cough, no inspiratory or expiratory crackles  Abd: Soft, non tender, non distended  Neuro: Alert & Oriented x 4    ASSESSMENT/PLAN:   Chronic obstructive pulmonary disease, unspecified COPD type ([place]) Assessment & Plan: Most likely has current acute exacerbation with viral illness. Not severe in that patient is afebrile without increased sputum or hypoxia. She is not tachypneic and is stable though very uncomfortable at this time. She has kept up with daily inhalers, but does not take her singulair daily due to pill burden.  - Reiterated importance of daily singulair as a preventative and antiinflammatory  - Advised to take duoneb in place of albuterol rescue during acute illness  - Will follow up with Dr. [name] (PCP) on 1/23  Orders: -     CBC with Differential/Platelet -     Coronavirus (COVID-19) with Influenza A and Influenza B -     Ipratropium-Albuterol; Take 3 mLs by nebulization every 4 (four) hours as needed.  Dispense: 360 mL; Refill: 0 -     Benzonatate; Take 1 capsule (100 mg total) by mouth 3 (three) times daily as needed for cough.  Dispense: 20 capsule; Refill: 0 -     Acetaminophen-Codeine;  Take 1 tablet by mouth every 8 (eight) hours as needed for up to 5 days for moderate pain.  Dispense: 15 tablet; Refill: 0 -     predniSONE; Take 2 tablets (40 mg total) by mouth daily with breakfast.  Dispense: 5 tablet; Refill: 0  Essential hypertension -     Basic metabolic panel -     amLODIPine Besylate; Take 0.5 tablets (2.5 mg total) by mouth at bedtime.  Dispense: 90 tablet; Refill: 3  Primary hypertension Assessment & Plan: BP very elevated today. Initially 496 systolic due to coughing. On recheck slightly improved at 161/79, but still above goal. Last three clinic appointments blood pressure has been increasing. Patient notes that BP at home is usually 759F systolic.  - Advised patient to bring in BP log  - Continue losartan  - Start amlodipine 2.5 and F/u w/ Dr. [name] on 1/23    Lowry Ram, MD [place]

## 2022-01-08 LAB — CBC WITH DIFFERENTIAL/PLATELET
Basophils Absolute: 0 10*3/uL (ref 0.0–0.2)
Basos: 0 %
EOS (ABSOLUTE): 0.2 10*3/uL (ref 0.0–0.4)
Eos: 2 %
Hematocrit: 46.1 % (ref 34.0–46.6)
Hemoglobin: 15.8 g/dL (ref 11.1–15.9)
Immature Grans (Abs): 0 10*3/uL (ref 0.0–0.1)
Immature Granulocytes: 0 %
Lymphocytes Absolute: 3.6 10*3/uL — ABNORMAL HIGH (ref 0.7–3.1)
Lymphs: 48 %
MCH: 31.1 pg (ref 26.6–33.0)
MCHC: 34.3 g/dL (ref 31.5–35.7)
MCV: 91 fL (ref 79–97)
Monocytes Absolute: 0.7 10*3/uL (ref 0.1–0.9)
Monocytes: 9 %
Neutrophils Absolute: 3.1 10*3/uL (ref 1.4–7.0)
Neutrophils: 41 %
Platelets: 350 10*3/uL (ref 150–450)
RBC: 5.08 x10E6/uL (ref 3.77–5.28)
RDW: 12.5 % (ref 11.7–15.4)
WBC: 7.6 10*3/uL (ref 3.4–10.8)

## 2022-01-08 LAB — BASIC METABOLIC PANEL
BUN/Creatinine Ratio: 17 (ref 12–28)
BUN: 11 mg/dL (ref 8–27)
CO2: 24 mmol/L (ref 20–29)
Calcium: 9.7 mg/dL (ref 8.7–10.3)
Chloride: 103 mmol/L (ref 96–106)
Creatinine, Ser: 0.65 mg/dL (ref 0.57–1.00)
Glucose: 99 mg/dL (ref 70–99)
Potassium: 4.2 mmol/L (ref 3.5–5.2)
Sodium: 142 mmol/L (ref 134–144)
eGFR: 100 mL/min/{1.73_m2} (ref >59)

## 2022-01-11 NOTE — Progress Notes (Signed)
Labs were all normal.

## 2022-01-20 DIAGNOSIS — M2560 Stiffness of unspecified joint, not elsewhere classified: Secondary | ICD-10-CM | POA: Diagnosis not present

## 2022-01-20 DIAGNOSIS — M545 Low back pain, unspecified: Secondary | ICD-10-CM | POA: Diagnosis not present

## 2022-01-20 DIAGNOSIS — M791 Myalgia, unspecified site: Secondary | ICD-10-CM | POA: Diagnosis not present

## 2022-01-20 DIAGNOSIS — M6281 Muscle weakness (generalized): Secondary | ICD-10-CM | POA: Diagnosis not present

## 2022-01-22 DIAGNOSIS — M6281 Muscle weakness (generalized): Secondary | ICD-10-CM | POA: Diagnosis not present

## 2022-01-22 DIAGNOSIS — M545 Low back pain, unspecified: Secondary | ICD-10-CM | POA: Diagnosis not present

## 2022-01-22 DIAGNOSIS — M791 Myalgia, unspecified site: Secondary | ICD-10-CM | POA: Diagnosis not present

## 2022-01-22 DIAGNOSIS — M2560 Stiffness of unspecified joint, not elsewhere classified: Secondary | ICD-10-CM | POA: Diagnosis not present

## 2022-01-25 ENCOUNTER — Other Ambulatory Visit: Payer: Self-pay | Admitting: Family Medicine

## 2022-01-25 DIAGNOSIS — M6281 Muscle weakness (generalized): Secondary | ICD-10-CM | POA: Diagnosis not present

## 2022-01-25 DIAGNOSIS — M545 Low back pain, unspecified: Secondary | ICD-10-CM | POA: Diagnosis not present

## 2022-01-25 DIAGNOSIS — M2560 Stiffness of unspecified joint, not elsewhere classified: Secondary | ICD-10-CM | POA: Diagnosis not present

## 2022-01-25 DIAGNOSIS — M791 Myalgia, unspecified site: Secondary | ICD-10-CM | POA: Diagnosis not present

## 2022-01-26 ENCOUNTER — Ambulatory Visit (INDEPENDENT_AMBULATORY_CARE_PROVIDER_SITE_OTHER): Payer: 59 | Admitting: Family Medicine

## 2022-01-26 ENCOUNTER — Encounter: Payer: Self-pay | Admitting: Family Medicine

## 2022-01-26 VITALS — BP 147/69 | HR 76 | Ht 59.0 in | Wt 148.0 lb

## 2022-01-26 DIAGNOSIS — I10 Essential (primary) hypertension: Secondary | ICD-10-CM

## 2022-01-26 DIAGNOSIS — J449 Chronic obstructive pulmonary disease, unspecified: Secondary | ICD-10-CM | POA: Diagnosis not present

## 2022-01-26 NOTE — Assessment & Plan Note (Signed)
BP above goal but improved following multiple checks. Plan to continue home BP monitoring x 2 weeks. Goal is BP < 140/90. F/U in 2 weeks for reassessment. Will plan on adding Norvasc 5 mg QD to Losartan 100 mg QD if BP remains above her goal. She agreed with the plan.

## 2022-01-26 NOTE — Assessment & Plan Note (Signed)
Stable following a touch of viral illness a week ago. Medication regimen reviewed. No adjustment needed at this time. Monitor closely for now.

## 2022-01-26 NOTE — Progress Notes (Signed)
    SUBJECTIVE:   CHIEF COMPLAINT / HPI:   HTN:  She denies any concerns. She is here for f/u. During her last visit when she was sick, her BP was elevated to 630 systolic; hence, she was started on Amlodipine. She took Losartan 100 mg QD plus Amlodipine 5 mg QD for 2 days and transitioned back to her regular regimen of Losartan only. Despite this her home BP has been in the 160F systolic.   Cough:  She feels better. Now takes nightly Singulair. No new concerns.   PERTINENT  PMH / PSH: PMHx reviewed.  OBJECTIVE:   Vitals:   01/26/22 0824 01/26/22 0843 01/26/22 0845  BP: (!) 157/65 (!) 153/71 (!) 147/69  Pulse: 76    SpO2: 100%    Weight: 148 lb (67.1 kg)    Height: '4\' 11"'$  (1.499 m)      Physical Exam Vitals and nursing note reviewed.  Cardiovascular:     Rate and Rhythm: Normal rate and regular rhythm.     Heart sounds: Normal heart sounds. No murmur heard. Pulmonary:     Effort: Pulmonary effort is normal. No respiratory distress.     Breath sounds: Normal breath sounds. No wheezing.  Musculoskeletal:        General: No deformity.     Comments:        ASSESSMENT/PLAN:   Hypertension BP above goal but improved following multiple checks. Plan to continue home BP monitoring x 2 weeks. Goal is BP < 140/90. F/U in 2 weeks for reassessment. Will plan on adding Norvasc 5 mg QD to Losartan 100 mg QD if BP remains above her goal. She agreed with the plan.   COPD (chronic obstructive pulmonary disease) (HCC) Stable following a touch of viral illness a week ago. Medication regimen reviewed. No adjustment needed at this time. Monitor closely for now.      Andrena Mews, MD [place]

## 2022-01-26 NOTE — Patient Instructions (Signed)
Blood Pressure Record Sheet To take your blood pressure, you will need a blood pressure machine. You may be prescribed one, or you can buy a blood pressure machine (blood pressure monitor) at your clinic, drug store, or online. When choosing one, look for these features: An automatic monitor that has an arm cuff. A cuff that wraps snugly, but not too tightly, around your upper arm. You should be able to fit only one finger between your arm and the cuff. A device that stores blood pressure reading results. Do not choose a monitor that measures your blood pressure from your wrist or finger. Follow your health care provider's instructions for how to take your blood pressure. To use this form: Get one reading in the morning (a.m.) before you take any medicines. Get one reading in the evening (p.m.) before supper. Take at least two readings with each blood pressure check. This makes sure the results are correct. Wait 1-2 minutes between measurements. Write down the results in the spaces on this form. Repeat this once a week, or as told by your health care provider. Make a follow-up appointment with your health care provider to discuss the results. Blood pressure log Date: _______________________ a.m. _____________________(1st reading) _____________________(2nd reading) p.m. _____________________(1st reading) _____________________(2nd reading) Date: _______________________ a.m. _____________________(1st reading) _____________________(2nd reading) p.m. _____________________(1st reading) _____________________(2nd reading) Date: _______________________ a.m. _____________________(1st reading) _____________________(2nd reading) p.m. _____________________(1st reading) _____________________(2nd reading) Date: _______________________ a.m. _____________________(1st reading) _____________________(2nd reading) p.m. _____________________(1st reading) _____________________(2nd reading) Date:  _______________________ a.m. _____________________(1st reading) _____________________(2nd reading) p.m. _____________________(1st reading) _____________________(2nd reading) This information is not intended to replace advice given to you by your health care provider. Make sure you discuss any questions you have with your health care provider. Document Revised: 09/04/2020 Document Reviewed: 09/04/2020 Elsevier Patient Education  2023 [employer]  

## 2022-01-27 DIAGNOSIS — M791 Myalgia, unspecified site: Secondary | ICD-10-CM | POA: Diagnosis not present

## 2022-01-27 DIAGNOSIS — M6281 Muscle weakness (generalized): Secondary | ICD-10-CM | POA: Diagnosis not present

## 2022-01-27 DIAGNOSIS — M545 Low back pain, unspecified: Secondary | ICD-10-CM | POA: Diagnosis not present

## 2022-01-27 DIAGNOSIS — M2560 Stiffness of unspecified joint, not elsewhere classified: Secondary | ICD-10-CM | POA: Diagnosis not present

## 2022-02-01 DIAGNOSIS — M545 Low back pain, unspecified: Secondary | ICD-10-CM | POA: Diagnosis not present

## 2022-02-01 DIAGNOSIS — M791 Myalgia, unspecified site: Secondary | ICD-10-CM | POA: Diagnosis not present

## 2022-02-01 DIAGNOSIS — M2560 Stiffness of unspecified joint, not elsewhere classified: Secondary | ICD-10-CM | POA: Diagnosis not present

## 2022-02-01 DIAGNOSIS — M6281 Muscle weakness (generalized): Secondary | ICD-10-CM | POA: Diagnosis not present

## 2022-02-03 ENCOUNTER — Other Ambulatory Visit: Payer: Self-pay | Admitting: Family Medicine

## 2022-02-03 DIAGNOSIS — M545 Low back pain, unspecified: Secondary | ICD-10-CM | POA: Diagnosis not present

## 2022-02-03 DIAGNOSIS — J449 Chronic obstructive pulmonary disease, unspecified: Secondary | ICD-10-CM

## 2022-02-03 DIAGNOSIS — M791 Myalgia, unspecified site: Secondary | ICD-10-CM | POA: Diagnosis not present

## 2022-02-03 DIAGNOSIS — M2560 Stiffness of unspecified joint, not elsewhere classified: Secondary | ICD-10-CM | POA: Diagnosis not present

## 2022-02-03 DIAGNOSIS — M6281 Muscle weakness (generalized): Secondary | ICD-10-CM | POA: Diagnosis not present

## 2022-02-08 DIAGNOSIS — M545 Low back pain, unspecified: Secondary | ICD-10-CM | POA: Diagnosis not present

## 2022-02-08 DIAGNOSIS — M791 Myalgia, unspecified site: Secondary | ICD-10-CM | POA: Diagnosis not present

## 2022-02-08 DIAGNOSIS — M6281 Muscle weakness (generalized): Secondary | ICD-10-CM | POA: Diagnosis not present

## 2022-02-08 DIAGNOSIS — M2560 Stiffness of unspecified joint, not elsewhere classified: Secondary | ICD-10-CM | POA: Diagnosis not present

## 2022-02-10 ENCOUNTER — Ambulatory Visit: Payer: Medicare Other | Admitting: Pulmonary Disease

## 2022-02-10 DIAGNOSIS — M2560 Stiffness of unspecified joint, not elsewhere classified: Secondary | ICD-10-CM | POA: Diagnosis not present

## 2022-02-10 DIAGNOSIS — M791 Myalgia, unspecified site: Secondary | ICD-10-CM | POA: Diagnosis not present

## 2022-02-10 DIAGNOSIS — M545 Low back pain, unspecified: Secondary | ICD-10-CM | POA: Diagnosis not present

## 2022-02-10 DIAGNOSIS — M6281 Muscle weakness (generalized): Secondary | ICD-10-CM | POA: Diagnosis not present

## 2022-02-16 ENCOUNTER — Ambulatory Visit (INDEPENDENT_AMBULATORY_CARE_PROVIDER_SITE_OTHER): Payer: 59 | Admitting: Family Medicine

## 2022-02-16 ENCOUNTER — Encounter: Payer: Self-pay | Admitting: Family Medicine

## 2022-02-16 ENCOUNTER — Other Ambulatory Visit: Payer: Self-pay | Admitting: Family Medicine

## 2022-02-16 VITALS — BP 150/70 | HR 85 | Ht 59.0 in | Wt 153.0 lb

## 2022-02-16 DIAGNOSIS — Z6830 Body mass index (BMI) 30.0-30.9, adult: Secondary | ICD-10-CM

## 2022-02-16 DIAGNOSIS — I10 Essential (primary) hypertension: Secondary | ICD-10-CM

## 2022-02-16 DIAGNOSIS — E6609 Other obesity due to excess calories: Secondary | ICD-10-CM

## 2022-02-16 NOTE — Progress Notes (Signed)
    SUBJECTIVE:   CHIEF COMPLAINT / HPI:   HTN: She is here fr f/u. She is compliant with Losarta 100 mg QD but not taking Norvasc 5 mg QD. Her home BP is in the 110/70 to 139/80. She denies any concerns.   Weight management:  She gained a few pounds in the last few weeks. She stated that she has been eating a larger portion of carbs. She plan on working on this.   PERTINENT  PMH / PSH: PMHx reviewed  OBJECTIVE:   Vitals:   02/16/22 0822 02/16/22 0835 02/16/22 0838  BP: (!) 159/74 (!) 152/81 (!) 150/70  Pulse: 85    SpO2: 99%    Weight: 153 lb (69.4 kg)    Height: '4\' 11"'$  (1.499 m)      Physical Exam Vitals and nursing note reviewed.  Cardiovascular:     Rate and Rhythm: Normal rate and regular rhythm.     Heart sounds: Normal heart sounds. No murmur heard. Pulmonary:     Effort: Pulmonary effort is normal. No respiratory distress.     Breath sounds: Normal breath sounds. No wheezing.      ASSESSMENT/PLAN:   Hypertension Here home BP report looks great. Office BP elevated. ?? White coat HTN. I agree with adding off on adding Norvasc to her regimen for now. Continue Losartan 100 mg QD. F/U with Dr. Valentina Lucks for ambulatory BP monitoring. Plan med adjustment based on home BP report.  Note that given her DM hx based on JNC8, her BP goal is < 140/90. Her home reading is mostly at her goal.   Obese Diet and exercise discussed. Monitor closely.     Andrena Mews, MD [place]

## 2022-02-16 NOTE — Assessment & Plan Note (Signed)
Here home BP report looks great. Office BP elevated. ?? White coat HTN. I agree with adding off on adding Norvasc to her regimen for now. Continue Losartan 100 mg QD. F/U with Dr. Valentina Lucks for ambulatory BP monitoring. Plan med adjustment based on home BP report.  Note that given her DM hx based on JNC8, her BP goal is < 140/90. Her home reading is mostly at her goal.

## 2022-02-16 NOTE — Assessment & Plan Note (Signed)
Diet and exercise discussed. Monitor closely.

## 2022-02-16 NOTE — Patient Instructions (Addendum)
It was nice seeing you. It seems your BP goes up only when you are in the office. We will plan on setting you up with Dr. Valentina Lucks for ambulatory blood pressure monitoring.

## 2022-02-17 ENCOUNTER — Telehealth: Payer: Self-pay | Admitting: Family Medicine

## 2022-02-17 DIAGNOSIS — M2560 Stiffness of unspecified joint, not elsewhere classified: Secondary | ICD-10-CM | POA: Diagnosis not present

## 2022-02-17 DIAGNOSIS — M6281 Muscle weakness (generalized): Secondary | ICD-10-CM | POA: Diagnosis not present

## 2022-02-17 DIAGNOSIS — M545 Low back pain, unspecified: Secondary | ICD-10-CM | POA: Diagnosis not present

## 2022-02-17 DIAGNOSIS — M791 Myalgia, unspecified site: Secondary | ICD-10-CM | POA: Diagnosis not present

## 2022-02-17 LAB — LIPID PANEL
Chol/HDL Ratio: 3 ratio (ref 0.0–4.4)
Cholesterol, Total: 192 mg/dL (ref 100–199)
HDL: 64 mg/dL (ref >39)
LDL Chol Calc (NIH): 107 mg/dL — ABNORMAL HIGH (ref 0–99)
Triglycerides: 121 mg/dL (ref 0–149)
VLDL Cholesterol Cal: 21 mg/dL (ref 5–40)

## 2022-02-17 LAB — HEMOGLOBIN A1C
Est. average glucose Bld gHb Est-mCnc: 151 mg/dL
Hgb A1c MFr Bld: 6.9 % — ABNORMAL HIGH (ref 4.8–5.6)

## 2022-02-17 NOTE — Telephone Encounter (Signed)
Lab report discussed.  LDL is the above goal on Zetia. However, she is intolerant to Statin. She plans to discuss other options (Bempidoic, PCSK9 inhibitors) with the pharmacy team on 2/19. A1C is now back in the diabetic range due to an increased carb diet. She will work on reducing carbs and f/u in 3 months for reassessment. At that time, she will consider getting back on diabetic medications if her A1C remains elevated. She requested Dexcom CGM - and I advised her to discuss it with the pharmacy during her visit. She agreed with the plan.

## 2022-02-19 ENCOUNTER — Ambulatory Visit (INDEPENDENT_AMBULATORY_CARE_PROVIDER_SITE_OTHER): Payer: 59 | Admitting: Pulmonary Disease

## 2022-02-19 ENCOUNTER — Encounter: Payer: Self-pay | Admitting: Pulmonary Disease

## 2022-02-19 VITALS — BP 122/82 | HR 102 | Ht 59.0 in | Wt 151.0 lb

## 2022-02-19 DIAGNOSIS — M2560 Stiffness of unspecified joint, not elsewhere classified: Secondary | ICD-10-CM | POA: Diagnosis not present

## 2022-02-19 DIAGNOSIS — M6281 Muscle weakness (generalized): Secondary | ICD-10-CM | POA: Diagnosis not present

## 2022-02-19 DIAGNOSIS — J449 Chronic obstructive pulmonary disease, unspecified: Secondary | ICD-10-CM | POA: Diagnosis not present

## 2022-02-19 DIAGNOSIS — M791 Myalgia, unspecified site: Secondary | ICD-10-CM | POA: Diagnosis not present

## 2022-02-19 DIAGNOSIS — M545 Low back pain, unspecified: Secondary | ICD-10-CM | POA: Diagnosis not present

## 2022-02-19 NOTE — Progress Notes (Signed)
$@Patientx$  ID: Betty Chapman, female    DOB: [birth year], 62 y.o.   MRN: [id number]  Chief Complaint  Patient presents with   Follow-up    Referring provider: [name], MD  HPI:   62 y.o. woman whom are seen in follow up for evaluation of COPD and dyspnea on exertion.  Most recent PCP note x 3 reviewed.  Overall, doing well.  No issues.  Good adherence to high-dose Dulera and Spiriva.  Did not take PPI last prescribed.  States her antidepressant was not pleased with what she read so as not not to take it.  Only took 2 doses.  Is on Pepcid for heartburn currently.  Breathing stable.  No complaints.  Cough okay.  HPI at initial visit: Patient has been undergoing CT lung cancer screening via PCP.  Recently 08/19/2021.  A moderate interpretation reveals emphysematous changes that are mild primarily in the upper lobes, and scattered thickened bronchioles.  There is mention of mildly enlarged pulmonary artery.  She had TTE 10/7-23 that reveals EF of around [id number], grade 1 diastolic dysfunction, normal RA size, normal RV size, normal RV function, normal estimated PASP.  She had PFTs in 2019 that demonstrate severe COPD.  Overall, she thinks inhalers work well for her.  She is on high-dose Dulera and Spiriva.  When she misses doses she no significant worsening in breathing.  Denies any significant history of exacerbations.  No recent prednisone use outpatient.  Her dyspnea is worse on inclines or stairs.  At time of day when things are better or worse.  No position make things better or worse.  No environmental seasonal factors she can identify that make things better or worse.  No other relieving or exacerbating factors.  PMH: GERD, hypertension, asthma Surgical history: Cholecystectomy, tubal ligation Social history: Current everyday smoker, lives in [place] Family history: Mother with brain aneurysm, brother with prostate cancer  Questionaires / Pulmonary Flowsheets:   ACT:       No data to display          MMRC:     No data to display          Epworth:      No data to display          Tests:   FENO:  No results found for: "NITRICOXIDE"  PFT:     No data to display          WALK:      No data to display          Imaging: Personally reviewed and as per EMR and discussion in this note No results found.  Lab Results: Personally reviewed CBC    Component Value Date/Time   WBC 7.6 01/07/2022 1151   WBC 9.3 07/13/2014 1820   RBC 5.08 01/07/2022 1151   RBC 4.95 07/13/2014 1820   HGB 15.8 01/07/2022 1151   HCT 46.1 01/07/2022 1151   PLT 350 01/07/2022 1151   MCV 91 01/07/2022 1151   MCH 31.1 01/07/2022 1151   MCH 31.7 07/13/2014 1820   MCHC 34.3 01/07/2022 1151   MCHC 34.0 07/13/2014 1820   RDW 12.5 01/07/2022 1151   LYMPHSABS 3.6 (H) 01/07/2022 1151   MONOABS 0.5 02/20/2014 1931   EOSABS 0.2 01/07/2022 1151   BASOSABS 0.0 01/07/2022 1151    BMET    Component Value Date/Time   NA 142 01/07/2022 1151   K 4.2 01/07/2022 1151   CL 103 01/07/2022 1151  CO2 24 01/07/2022 1151   GLUCOSE 99 01/07/2022 1151   GLUCOSE 124 (H) 07/13/2014 1820   BUN 11 01/07/2022 1151   CREATININE 0.65 01/07/2022 1151   CALCIUM 9.7 01/07/2022 1151   GFRNONAA 101 12/18/2018 0858   GFRAA 117 12/18/2018 0858    BNP No results found for: "BNP"  ProBNP No results found for: "PROBNP"  Specialty Problems       Pulmonary Problems   COPD (chronic obstructive pulmonary disease) (HCC)    Allergies  Allergen Reactions   Mushroom Extract Complex Anaphylaxis and Rash   Pravastatin Sodium Hives and Itching   Statins     Hives    Immunization History  Administered Date(s) Administered   Influenza,inj,Quad PF,6+ Mos 11/10/2017, 09/01/2018   Pneumococcal Polysaccharide-23 11/10/2017   Td 01/04/2001   Tdap 07/04/2018    Past Medical History:  Diagnosis Date   Blurry vision, bilateral 03/20/2014   Callus of foot 07/16/2014   COPD  (chronic obstructive pulmonary disease) ([place])    COVID-19 virus infection    Dysphagia, pharyngoesophageal phase    Epidermal cyst of face 07/16/2014   Hypercholesterolemia    Hypertension    TIA (transient ischemic attack)    Once   Tobacco abuse    Unintended weight loss 02/06/2019    Tobacco History: Social History   Tobacco Use  Smoking Status Every Day   Packs/day: 1.00   Years: 47.00   Total pack years: 47.00   Types: Cigarettes   Start date: 01/04/1970  Smokeless Tobacco Never  Tobacco Comments   1 pack per day max    Ready to quit: Not Answered Counseling given: Not Answered Tobacco comments: 1 pack per day max    Continue to not smoke  Outpatient Encounter Medications as of 02/19/2022  Medication Sig   albuterol (PROVENTIL) (2.5 MG/3ML) 0.083% nebulizer solution USE 1 VIAL VIA NEBULIZER EVERY 6 HOURS AS NEEDED FOR WHEEZING OR  SHORTNESS OF BREATH   albuterol (VENTOLIN HFA) 108 (90 Base) MCG/ACT inhaler USE 1 TO 2 INHALATIONS BY MOUTH  EVERY 6 HOURS AS NEEDED FOR  WHEEZING OR SHORTNESS OF BREATH   aspirin EC 81 MG tablet Take 1 tablet (81 mg total) by mouth daily. Swallow whole.   baclofen (LIORESAL) 10 MG tablet TAKE 1 TABLET BY MOUTH 1 TO 2  TIMES DAILY AS NEEDED FOR PAIN  AND MUSCLE TIGHTNESS   betamethasone valerate (VALISONE) 0.1 % cream Apply topically 2 (two) times daily. Apply to vulva area. Avoid use if there is a break in skin.   blood glucose meter kit and supplies KIT Accu-Chek Aviva Plus Use up to TID daily as directed. (FOR ICD-9 250.00, 250.01).   ezetimibe (ZETIA) 10 MG tablet Take 1 tablet (10 mg total) by mouth daily.   famotidine (PEPCID) 10 MG tablet Take 10 mg by mouth 2 (two) times daily.   ibuprofen (ADVIL) 400 MG tablet Take 1 tablet (400 mg total) by mouth every 8 (eight) hours as needed for moderate pain.   losartan (COZAAR) 100 MG tablet TAKE 1 TABLET BY MOUTH DAILY   mometasone-formoterol (DULERA) 200-5 MCG/ACT AERO USE 2 INHALATIONS BY MOUTH   TWICE DAILY   montelukast (SINGULAIR) 10 MG tablet Take 1 tablet (10 mg total) by mouth at bedtime.   tiotropium (SPIRIVA HANDIHALER) 18 MCG inhalation capsule INHALE THE CONTENTS OF 1  CAPSULE BY MOUTH VIA  HANDIHALER DAILY   benzonatate (TESSALON PERLES) 100 MG capsule Take 1 capsule (100 mg total) by mouth 3 (  three) times daily as needed for cough. (Patient not taking: Reported on 01/26/2022)   No facility-administered encounter medications on file as of 02/19/2022.     Review of Systems  Review of Systems  N/a Physical Exam  BP 122/82 (BP Location: Left Arm, Cuff Size: Normal)   Pulse (!) 102   Ht 4' 11"$  (1.499 m)   Wt 151 lb (68.5 kg)   LMP 12/27/2011   SpO2 97%   BMI 30.50 kg/m   Wt Readings from Last 5 Encounters:  02/19/22 151 lb (68.5 kg)  02/16/22 153 lb (69.4 kg)  01/26/22 148 lb (67.1 kg)  01/07/22 148 lb 3.2 oz (67.2 kg)  11/17/21 147 lb 2 oz (66.7 kg)    BMI Readings from Last 5 Encounters:  02/19/22 30.50 kg/m  02/16/22 30.90 kg/m  01/26/22 29.89 kg/m  01/07/22 29.93 kg/m  11/17/21 29.72 kg/m     Physical Exam General: Sitting in chair, no acute distress Eyes: EOMI, no icterus Neck: Supple, JVP Pulmonary: Distant, clear, normal work of breathing Abdomen: Nontender, bowel sounds present Cardiovascular: Warm, no edema MSK: No synovitis, no joint effusion Neuro: Normal gait, no weakness Psych: Normal mood, full affect   Assessment & Plan:   Dyspnea on exertion: Likely multifactorial related to severe COPD and prior spirometry, heart failure with EF 45% and chronotropic/inotropic incompetence.  Recent TTE without evidence of elevated PASP here signs of RV dysfunction etc.  Reassuring in terms of lack of pulmonary hypertension.  Symptoms worsen when does not use inhaler therapy.  Continue therapy for COPD as below.  Severe COPD: Gold B.  Based on PFTs, spirometry 11/2017, no bronchodilator response.  Continue triple inhaled therapy via high-dose  Dulera and Spiriva.  Continue albuterol as needed.  No recent exacerbations.   Return in about 1 year (around 02/20/2023).   Lanier Clam, MD 02/19/2022

## 2022-02-19 NOTE — Patient Instructions (Addendum)
Nice to see you again  No change to medications today  Continue the Spiriva and the [facility]  Return to clinic in 1 year or sooner as needed

## 2022-02-22 ENCOUNTER — Ambulatory Visit: Payer: 59 | Admitting: Pharmacist

## 2022-02-22 ENCOUNTER — Other Ambulatory Visit: Payer: Self-pay | Admitting: Family Medicine

## 2022-03-02 DIAGNOSIS — E119 Type 2 diabetes mellitus without complications: Secondary | ICD-10-CM | POA: Diagnosis not present

## 2022-03-02 LAB — HM DIABETES EYE EXAM

## 2022-03-03 ENCOUNTER — Ambulatory Visit (INDEPENDENT_AMBULATORY_CARE_PROVIDER_SITE_OTHER): Payer: 59 | Admitting: Pharmacist

## 2022-03-03 ENCOUNTER — Encounter: Payer: Self-pay | Admitting: Pharmacist

## 2022-03-03 VITALS — BP 139/60 | HR 79 | Wt 149.0 lb

## 2022-03-03 DIAGNOSIS — F17200 Nicotine dependence, unspecified, uncomplicated: Secondary | ICD-10-CM

## 2022-03-03 DIAGNOSIS — I10 Essential (primary) hypertension: Secondary | ICD-10-CM | POA: Diagnosis not present

## 2022-03-03 NOTE — Assessment & Plan Note (Signed)
Longstanding tobacco abuse - not interested in cutting down or quitting at this time. Plan to discuss tobacco cessation again at future visits.

## 2022-03-03 NOTE — Assessment & Plan Note (Signed)
History of hypertension longstanding currently reports home blood pressures at goal presssure of <130/80.     Set-up with 24-hour ambulatory blood pressure evaluation

## 2022-03-03 NOTE — Patient Instructions (Signed)
Blood Pressure Activity Diary Time Lying down/ Sleeping Walking/ Exercise Stressed/ Angry Headache/ Pain Dizzy  9 AM       10 AM       11 AM       12 PM       1 PM       2 PM       Time Lying down/ Sleeping Walking/ Exercise Stressed/ Angry Headache/ Pain Dizzy  3 PM       4 PM        5 PM       6 PM       7 PM       8 PM       Time Lying down/ Sleeping Walking/ Exercise Stressed/ Angry Headache/ Pain Dizzy  9 PM       10 PM       11 PM       12 AM       1 AM       2 AM       3 AM       Time Lying down/ Sleeping Walking/ Exercise Stressed/ Angry Headache/ Pain Dizzy  4 AM       5 AM       6 AM       7 AM       8 AM       9 AM       10 AM        Time you woke up: _________                  Time you went to sleep:__________  Come back tomorrow at 10 am to have the monitor removed Call the [facility] if you have any questions before then ([phone])  Wearing the Blood Pressure Monitor The cuff will inflate every 20 minutes during the day and every 30 minutes while you sleep. Your blood pressure readings will NOT display after cuff inflation Fill out the blood pressure-activity diary during the day, especially during activities that may affect your reading -- such as exercise, stress, walking, taking your blood pressure medications  Important things to know: Avoid taking the monitor off for the next 24 hours, unless it causes you discomfort or pain. Do NOT get the monitor wet and do NOT dry to clean the monitor with any cleaning products. Do NOT put the monitor on anyone else's arm. When the cuff inflates, avoid excess movement. Let the cuffed arm hang loosely, slightly away from the body. Avoid flexing the muscles or moving the hand/fingers. When you go to sleep, make sure that the hose is not kinked. Remember to fill out the blood pressure activity diary. If you experience severe pain or unusual pain (not associated with getting your blood pressure  checked), remove the monitor.  Troubleshooting:  Code  Troubleshooting   1  Check cuff position, tighten cuff   2, 3  Remain still during reading   4, 87  Check air hose connections and make sure cuff is tight   85, 89  Check hose connections and make tubing is not crimped   86  Push START/STOP to restart reading   88, 91  Retry by pushing START/STOP   90  Replace batteries. If problem persists, remove monitor and bring back to   clinic at follow up   97, 98, 99  Service required - Remove monitor and bring back to clinic  at follow up

## 2022-03-03 NOTE — Progress Notes (Signed)
S:     Chief Complaint  Patient presents with   Medication Management    Ambulatory BP monitor   62 y.o. female who presents for hypertension evaluation, education, and management.  PMH is significant for hypertension, COPD, 123456, GERD, diastolic CHF, HLD, tobacco abuse.  Patient was referred and last seen by Primary Care Provider, Dr. [name], on 02/16/2022.  At last visit, BP was elevated. Amlodipine (Norvasc) was prescribed 01/26/2022; however, there was confusion on whether the patient should be taking or not. Patient took for about 2 days and then stopped because unclear why she was taking it.   Hypertension Diagnosed with Hypertension in the year of 2010.    Medication compliance is reported to be good.  Discussed procedure for wearing the monitor and gave patient written instructions. Monitor was placed on non-dominant arm with instructions to return in the morning.   Current BP Medications include:  losartan 100 mg daily   Antihypertensives tried in the past include: lisinopril    Dietary habits include: about 2 meals/day. Denies eating fried foods. Snacks include nuts or fruit. Meals contain lean meat.    Patient does take BP readings at home and denies frequent high readings.    Tobacco Abuse Age when started using tobacco on a daily basis 62 yo. Number of cigarettes/day 20.    Patient has never seriously attempted to quit smoking.  Medications used in past cessation efforts include: NRT - nicotine patches  Rates IMPORTANCE of quitting tobacco on 1-10 scale of 0.  Most common triggers to use tobacco include; boredom, craving something to do and does not want to replace it with eating. Smokes while drinking morning coffee.     Motivation to quit: not ready to quit.   Insurance: [employer] Medicare  O:  Review of Systems  All other systems reviewed and are negative.   Physical Exam Vitals reviewed.  Constitutional:      Appearance: Normal appearance.  Pulmonary:      Effort: Respiratory distress (Sp02 93-97 during walking test) present.     Breath sounds: Wheezes: productive cough.  Neurological:     Mental Status: She is alert.  Psychiatric:        Mood and Affect: Mood normal.        Behavior: Behavior normal.        Thought Content: Thought content normal.        Judgment: Judgment normal.     Last 3 Office BP readings: BP Readings from Last 3 Encounters:  03/03/22 139/60  02/19/22 122/82  02/16/22 (!) 150/70    Clinical Atherosclerotic Cardiovascular Disease (ASCVD): Yes  The 10-year ASCVD risk score (Arnett DK, et al., 2019) is: 19%   Values used to calculate the score:     Age: 81 years     Sex: Female     Is Non-Hispanic African American: No     Diabetic: Yes     Tobacco smoker: Yes     Systolic Blood Pressure: XX123456 mmHg     Is BP treated: Yes     HDL Cholesterol: 64 mg/dL     Total Cholesterol: 192 mg/dL  Basic Metabolic Panel    Component Value Date/Time   NA 142 01/07/2022 1151   K 4.2 01/07/2022 1151   CL 103 01/07/2022 1151   CO2 24 01/07/2022 1151   GLUCOSE 99 01/07/2022 1151   GLUCOSE 124 (H) 07/13/2014 1820   BUN 11 01/07/2022 1151   CREATININE 0.65 01/07/2022 1151  CALCIUM 9.7 01/07/2022 1151   GFRNONAA 101 12/18/2018 0858   GFRAA 117 12/18/2018 0858    Renal function: CrCl cannot be calculated (Patient's most recent lab result is older than the maximum 21 days allowed.).   ABPM Study Data: Arm Placement left arm  A/P: History of hypertension longstanding currently reports home blood pressures at goal presssure of <130/80.     Set-up with 24-hour ambulatory blood pressure evaluation    Longstanding tobacco abuse - not interested in cutting down or quitting at this time. Plan to discuss tobacco cessation again at future visits.   Written patient instructions provided. Patient verbalized understanding of treatment plan.  Total time in face to face counseling 40 minutes.    Follow-up:  Pharmacist  BP monitor follow-up  Patient seen with Francena Hanly, PharmD PGY-1 Pharmacy Resident and Dixon Boos, PharmD Candidate.

## 2022-03-04 ENCOUNTER — Ambulatory Visit (INDEPENDENT_AMBULATORY_CARE_PROVIDER_SITE_OTHER): Payer: 59 | Admitting: Pharmacist

## 2022-03-04 ENCOUNTER — Encounter: Payer: Self-pay | Admitting: Pharmacist

## 2022-03-04 VITALS — BP 136/67

## 2022-03-04 DIAGNOSIS — E1169 Type 2 diabetes mellitus with other specified complication: Secondary | ICD-10-CM | POA: Diagnosis not present

## 2022-03-04 DIAGNOSIS — I10 Essential (primary) hypertension: Secondary | ICD-10-CM | POA: Diagnosis not present

## 2022-03-04 DIAGNOSIS — F17200 Nicotine dependence, unspecified, uncomplicated: Secondary | ICD-10-CM

## 2022-03-04 DIAGNOSIS — E785 Hyperlipidemia, unspecified: Secondary | ICD-10-CM

## 2022-03-04 MED ORDER — ROSUVASTATIN CALCIUM 10 MG PO TABS: 10.0000 mg | ORAL_TABLET | Freq: Every day | ORAL | 1 refills | Status: DC

## 2022-03-04 MED ORDER — VARENICLINE TARTRATE 0.5 MG PO TABS: ORAL_TABLET | ORAL | 3 refills | Status: DC

## 2022-03-04 NOTE — Assessment & Plan Note (Signed)
Tobacco intake reduction. Patient motivated to quit. Good candidate for success given her willingness to use pharmacotherapy.  -Initiated varenicline 0.5 mg by mouth once daily with food x7 days, then 0.5 mg by mouth twice daily with food thereafter. Patient counseled on purpose, proper use, and potential adverse effects, including GI upset. Short-term goal is for intake reduction - reduced from 1 ppd.

## 2022-03-04 NOTE — Progress Notes (Signed)
S:    Chief Complaint  Patient presents with   Medication Management    Ambulatory BP monitor day #2   62 y.o. female who presents for hypertension evaluation, education, and management. PMH is significant for hypertension, COPD, 123456, GERD, diastolic CHF, HLD, tobacco abuse.  Patient was referred and last seen by Primary Care Provider, Dr. [name], on 02/16/2022. Seen yesterday by pharmacy clinic for placement of ambulatory BP monitor.   Hypertension Diagnosed with Hypertension in the year of 2010.     Medication compliance is reported to be good.  Current BP Medications include:  losartan 100 mg daily   Antihypertensives tried in the past include:  lisinopril      Day #2 - Patient returns to clinic and reports some arm irritation with the BP cuff, but she was still able to wear the Ambulatory Blood Pressure Cuff for the entire 24 evaluation period.   O:  ROS  Physical Exam  Last 3 Office BP readings: BP Readings from Last 3 Encounters:  03/03/22 139/60  02/19/22 122/82  02/16/22 (!) 150/70    Clinical Atherosclerotic Cardiovascular Disease (ASCVD): Yes  The 10-year ASCVD risk score (Arnett DK, et al., 2019) is: 19%   Values used to calculate the score:     Age: 11 years     Sex: Female     Is Non-Hispanic African American: No     Diabetic: Yes     Tobacco smoker: Yes     Systolic Blood Pressure: [id number] mmHg     Is BP treated: Yes     HDL Cholesterol: 64 mg/dL     Total Cholesterol: 192 mg/dL  Basic Metabolic Panel    Component Value Date/Time   NA 142 01/07/2022 1151   K 4.2 01/07/2022 1151   CL 103 01/07/2022 1151   CO2 24 01/07/2022 1151   GLUCOSE 99 01/07/2022 1151   GLUCOSE 124 (H) 07/13/2014 1820   BUN 11 01/07/2022 1151   CREATININE 0.65 01/07/2022 1151   CALCIUM 9.7 01/07/2022 1151   GFRNONAA 101 12/18/2018 0858   GFRAA 117 12/18/2018 0858       ABPM Study Data: Arm Placement left arm  Overall Mean 24hr BP:   120/65 mmHg  HR: 91  Daytime  Mean BP:  136/67 mmHg  HR: 96  Nighttime Mean BP:  112/57 mmHg  HR: 77  Dipping Pattern: Yes.    Sys:   18.1%   Dia: 15.0%   [normal dipping ~10-20%]  For Office Goal Goal BP of <130/80:  ABPM thresholds: Overall BP < 125/75, daytime BP <130/80 mmHg, sleeptime BP <110/65 mmHg    A/P: History of hypertension longstanding currently taking losartan 100 mg daily; with goal presssure of <130/80 mmHg. Found to have isolated systolic hypertension with 24-hour ambulatory blood pressure evaluation which demonstrates an average AWAKE blood pressure of 136/67 mmHg. Nocturnal dipping pattern is normal.   -Continued losartan 100 mg daily.  Tobacco intake reduction. Patient motivated to quit. Good candidate for success given her willingness to use pharmacotherapy.  -Initiated varenicline 0.5 mg by mouth once daily with food x7 days, then 0.5 mg by mouth twice daily with food thereafter. Patient counseled on purpose, proper use, and potential adverse effects, including GI upset. Short-term goal is for intake reduction - reduced from 1 ppd.  ASCVD risk - secondary prevention in patient with diabetes. Last LDL is not at goal of <70 mg/dL. Moderate intensity statin indicated.  -Started rosuvastatin 10 mg.  -Continue ezetimibe  10 mg daily.   Results reviewed and written information provided.    Written patient instructions provided. Patient verbalized understanding of treatment plan.  Total time in face to face counseling 20 minutes.    Follow-up:  Pharmacist 6 weeks PCP clinic visit in June 2024  Patient seen with Francena Hanly, PharmD PGY-1 Pharmacy Resident and Joseph Art, PharmD, PGY2 Pharmacy Resident.

## 2022-03-04 NOTE — Assessment & Plan Note (Signed)
History of hypertension longstanding currently taking losartan 100 mg daily; with goal presssure of <130/80 mmHg. Found to have isolated systolic hypertension with 24-hour ambulatory blood pressure evaluation which demonstrates an average AWAKE blood pressure of 136/67 mmHg. Nocturnal dipping pattern is normal.   -Continued losartan 100 mg daily.

## 2022-03-04 NOTE — Assessment & Plan Note (Signed)
ASCVD risk - secondary prevention in patient with diabetes. Last LDL is not at goal of <70 mg/dL. Moderate intensity statin indicated.  -Started rosuvastatin 10 mg.  -Continue ezetimibe 10 mg daily.

## 2022-03-04 NOTE — Patient Instructions (Addendum)
It was nice to see you today!  Your goal blood pressure is <130/80 mmHg.  Medication Changes: Start rosuvastatin 10 mg daily. Start Chantix 1 tablet with food once daily for 7 days. Then increase to 1 tablet twice daily with food if tolerated.  Continue losartan 100 mg daily.   Monitor blood pressure at home daily and keep a log (on your phone or piece of paper) to bring with you to your next visit. Write down date, time, blood pressure and pulse.  Keep up the good work with diet and exercise. Aim for a diet full of vegetables, fruit and lean meats (chicken, [place], fish). Try to limit salt intake by eating fresh or frozen vegetables (instead of canned), rinse canned vegetables prior to cooking and do not add any additional salt to meals.

## 2022-03-05 ENCOUNTER — Other Ambulatory Visit: Payer: Self-pay

## 2022-03-05 DIAGNOSIS — E1169 Type 2 diabetes mellitus with other specified complication: Secondary | ICD-10-CM

## 2022-03-05 NOTE — Progress Notes (Signed)
Reviewed and agree with Dr Graylin Shiver plan.

## 2022-03-06 ENCOUNTER — Encounter: Payer: Self-pay | Admitting: Family Medicine

## 2022-03-08 ENCOUNTER — Other Ambulatory Visit: Payer: Self-pay

## 2022-03-08 DIAGNOSIS — E785 Hyperlipidemia, unspecified: Secondary | ICD-10-CM

## 2022-03-08 MED ORDER — ROSUVASTATIN CALCIUM 10 MG PO TABS: 10.0000 mg | ORAL_TABLET | Freq: Every day | ORAL | 1 refills | Status: DC

## 2022-03-24 ENCOUNTER — Telehealth: Payer: Self-pay

## 2022-03-24 NOTE — Telephone Encounter (Signed)
Patient calls nurse line regarding issues with receiving rosuvastatin prescription.   Called [facility]. Pharmacy tech reports that this has not been dispensed yet due to patient having documented allergy to statins.   Needs okay from provider to proceed.   Ref number: [id number]  Forwarding to PCP.   Talbot Grumbling, RN

## 2022-03-24 NOTE — Telephone Encounter (Signed)
I called the pharmacy. Hx of itchy with Pravachol and Atorvastatin. Per last conversation with Dr. Valentina Lucks, the patient is willing to trial Crestor and she is aware that these are the same medication family. Plan to d/c if she has any s/e to this med. Prescription approved.

## 2022-04-15 ENCOUNTER — Encounter: Payer: Self-pay | Admitting: Pharmacist

## 2022-04-15 ENCOUNTER — Ambulatory Visit (INDEPENDENT_AMBULATORY_CARE_PROVIDER_SITE_OTHER): Payer: 59 | Admitting: Pharmacist

## 2022-04-15 VITALS — BP 146/67 | HR 85 | Wt 150.0 lb

## 2022-04-15 DIAGNOSIS — F17200 Nicotine dependence, unspecified, uncomplicated: Secondary | ICD-10-CM | POA: Diagnosis not present

## 2022-04-15 NOTE — Patient Instructions (Signed)
Tobacco Patient Instructions  Quitting smoking is one of the most important decisions you can make for your current and future health. Consider what you dislike about smoking and how quitting could personally benefit you. Try to cut down. Aim for reducing the amount you smoke over the next few months.  Starting today, Be a Quitter!  Remind yourself why you want to quit.  Delay your first cigarette of the day for as long as possible.  Start cleaning out all pockets, drawers, and your car of cigarettes.  Getting Through the Cravings Once You Are Smoke Free: Each craving will last about 10 minutes, whether or not you smoke. Here's how to get through the cravings without cigarettes:  DELAY: Tell yourself that you'll wait for the next craving. Do it every time! DEEP BREATHS: One reason smoking feels good is because you breathe in deeply to inhale. Take four slow, deep breaths and feel the relaxation without the hamful effects of cigarettes. DRINK WATER: Drink a glass of cool water. It will give your hands and mouth something to do and will help flush the nicotine out of your system faster. DIVERT: Do something else -- brush your teeth, take a walk, call a friend who can offer you support. Just moving onto something other than thinking about cigarettes will move you through the craving.   Frequently Asked Questions  What can I do when I get the urge to smoke? To get through the urge to smoke, try the following:  Review your reasons for quitting and think of all the benefits to your health, your finances, and your family.  Remind yourself that there is no such thing as just one cigarette -- or even one puff.  Ride out the desire to smoke. Use the 4 Os -- Delay, Deep Breaths, Drink Water and Divert to get you through. The craving will go away eventually. Do not fool yourself into thinking you can have just one cigarette.  Any tips on how to deal with stress? Stress is a natural part of life. The  key is to deal with it without reaching for a cigarette. Taking deep breaths, counting backwards from 10 and asking yourself 1-how big a deal is this?"  Writing down your feelings, talking with a friend and doing things like positive self-talk and meditation are some other ways that people deal with daily stress.  What if I start smoking again? Slips happen. Most people try to quit smoking a few times before they are successful. Don't beat yourself up if this happens to you! Ask yourself if this was a slip or a relapse. A slip is a one-time mistake that is quickly corrected. A relapse is going back to your old smoking habits.   If you slip, don't give up. Think of it as a learning experience. Ask yourself what went wrong and renew your commitment to staying away from smoking for good.  If you relapse, try not to get discouraged. Ask yourself the question "What caused me to start smoking?" Figure out what helped you and what didn't when you tried to quit. Knowing why you relapsed is useful information for your next attempt to quit.

## 2022-04-15 NOTE — Progress Notes (Signed)
   S:  Chief Complaint  Patient presents with   Medication Management    Smoking cessation   Betty Chapman is a 62 y.o. female who presents for evaluation/assistance with tobacco dependence. PMH is significant for COPD, DM, CHF, HLD, and tobacco use disorder. Patient was referred and last seen by Primary Care Provider, Dr. Lum Babe, on 02/16/2022. Last seen by pharmacy clinic on 03/04/2022 where varenicline was started with goal of intake reduction.  Patient reports she has cut down to 10 cigarettes/day. Although, she has not started varenicline due to her 'not wanting to take medicine'. Favorite cigarette of the day is in the morning Stopped smoking in the car. Her brother is also working on cutting down on smoking, he is down to 5 cigarette. Reports her breathing is somewhat improved.   Started smoking at ~62 YO. Number of cigarettes/day 10.  Estimated nicotine content per cigarette (mg).  Estimated nicotine intake per day 10 mg.   Denies waking to smoke.  Medications used in past cessation efforts include: varenicline (did not pick up bc she didn't want to take medications).  Most common triggers to use tobacco include: coffee in the morning, meals   Motivation to quit: health   O: Clinical ASCVD: No  The 10-year ASCVD risk score (Arnett DK, et al., 2019) is: 18.2%   Values used to calculate the score:     Age: 72 years     Sex: Female     Is Non-Hispanic African American: No     Diabetic: Yes     Tobacco smoker: Yes     Systolic Blood Pressure: 136 mmHg     Is BP treated: Yes     HDL Cholesterol: 64 mg/dL     Total Cholesterol: 192 mg/dL  Review of Systems  All other systems reviewed and are negative.   Physical Exam Constitutional:      Appearance: Normal appearance. She is normal weight.  Pulmonary:     Effort: Pulmonary effort is normal.  Neurological:     Mental Status: She is alert.  Psychiatric:        Mood and Affect: Mood normal.        Behavior:  Behavior normal.        Thought Content: Thought content normal.    A/P: Tobacco use disorder with moderate nicotine dependence of years duration in a patient who is fair candidate for success because of her progress thus far. Patient reports she is somewhat willing to continue to cut down.  -Discontinue varenicline as patient per patient request.  -Provided information on 1 800-QUIT NOW support program.   Written patient instructions provided. Patient verbalized understanding of treatment plan.  Total time in face to face counseling 20 minutes.    Follow-up:  Pharmacist 06/22/2022. PCP clinic visit 06/22/2022 Patient seen with Earl Gala PGY-1 Pharmacy Resident, Revonda Standard, PharmD Candidate and Valeda Malm, PharmD, PGY2 Pharmacy Resident.

## 2022-04-15 NOTE — Assessment & Plan Note (Signed)
Tobacco use disorder with moderate nicotine dependence of years duration in a patient who is fair candidate for success because of her progress thus far. Patient reports she is somewhat willing to continue to cut down.  -Discontinue varenicline as patient per patient request.  -Provided information on 1 800-QUIT NOW support program.

## 2022-04-19 NOTE — Progress Notes (Signed)
Reviewed and agree with Dr Koval's plan.   

## 2022-04-25 ENCOUNTER — Other Ambulatory Visit: Payer: Self-pay | Admitting: Family Medicine

## 2022-06-15 ENCOUNTER — Ambulatory Visit: Payer: 59 | Admitting: Family Medicine

## 2022-06-22 ENCOUNTER — Ambulatory Visit (INDEPENDENT_AMBULATORY_CARE_PROVIDER_SITE_OTHER): Payer: 59 | Admitting: Pharmacist

## 2022-06-22 ENCOUNTER — Encounter: Payer: Self-pay | Admitting: Pharmacist

## 2022-06-22 ENCOUNTER — Ambulatory Visit (INDEPENDENT_AMBULATORY_CARE_PROVIDER_SITE_OTHER): Payer: 59 | Admitting: Family Medicine

## 2022-06-22 ENCOUNTER — Encounter: Payer: Self-pay | Admitting: Family Medicine

## 2022-06-22 VITALS — BP 136/68 | HR 72 | Ht 59.0 in | Wt 146.0 lb

## 2022-06-22 DIAGNOSIS — E785 Hyperlipidemia, unspecified: Secondary | ICD-10-CM

## 2022-06-22 DIAGNOSIS — F17200 Nicotine dependence, unspecified, uncomplicated: Secondary | ICD-10-CM | POA: Diagnosis not present

## 2022-06-22 DIAGNOSIS — M6530 Trigger finger, unspecified finger: Secondary | ICD-10-CM | POA: Insufficient documentation

## 2022-06-22 DIAGNOSIS — J449 Chronic obstructive pulmonary disease, unspecified: Secondary | ICD-10-CM | POA: Diagnosis not present

## 2022-06-22 DIAGNOSIS — I10 Essential (primary) hypertension: Secondary | ICD-10-CM

## 2022-06-22 DIAGNOSIS — E1169 Type 2 diabetes mellitus with other specified complication: Secondary | ICD-10-CM | POA: Diagnosis not present

## 2022-06-22 DIAGNOSIS — Z1231 Encounter for screening mammogram for malignant neoplasm of breast: Secondary | ICD-10-CM | POA: Diagnosis not present

## 2022-06-22 LAB — POCT GLYCOSYLATED HEMOGLOBIN (HGB A1C): HbA1c, POC (controlled diabetic range): 6.8 % (ref 0.0–7.0)

## 2022-06-22 MED ORDER — BLOOD GLUCOSE TEST VI STRP: 1.0000 | ORAL_STRIP | Freq: Every day | 1 refills | Status: DC

## 2022-06-22 MED ORDER — LANCET DEVICE MISC: 1.0000 | Freq: Every day | 1 refills | Status: AC

## 2022-06-22 MED ORDER — LANCETS MISC. MISC: 1.0000 | Freq: Every day | 1 refills | Status: AC

## 2022-06-22 MED ORDER — BLOOD GLUCOSE MONITORING SUPPL DEVI: 1.0000 | Freq: Every day | 0 refills | Status: DC

## 2022-06-22 NOTE — Assessment & Plan Note (Signed)
A1C improved today from 6.9 to 6.8 Continue diet control and lifestyle modification. F/U in 3 months for reassessment. Glucometer escribed to monitor glucose at home.

## 2022-06-22 NOTE — Assessment & Plan Note (Signed)
Tobacco use disorder with moderate nicotine dependence of years duration in a patient who is fair candidate for success because of her progress thus far.  She agrees to attempt cut try to cut down to 1 pack every 3 days by the end of December 2024. - Has varenicline in her possession but is not interested in using "another medication" at this time.

## 2022-06-22 NOTE — Assessment & Plan Note (Signed)
Conservative measures (massage, exercise, NSAID, muscle relaxant) discussed with she had tried in the pass. She wants a second opinion and requested referral to a specialist. Referral to hand specialist ordered.

## 2022-06-22 NOTE — Patient Instructions (Addendum)
Trigger Finger  Trigger finger is a condition affecting tendons that flex the fingers and thumb, typically resulting in a sensation of locking or catching when you bend and straighten your digits. Other symptoms may include pain and stiffness in the fingers and thumb. The condition is also known as stenosing tenosynovitis.  The ring finger and thumb are most commonly affected digits; however, the condition can affect any of the digits. When the thumb is involved, the condition is, appropriately, called trigger thumb.  Treatment Nonsurgical Treatment Initial treatment for a trigger finger is usually nonsurgical.  Rest. Resting your hand and avoiding activities that make it worse may help to resolve the problem.  Splinting. Wearing a splint at night to keep the affected finger or thumb in a straight position while you sleep may be helpful.  Exercises. Gentle stretching exercises can help decrease stiffness and improve range of motion in the involved digit.  Medications. Over-the-counter medications, such as acetaminophen and nonsteroidal anti-inflammatory drugs (NSAIDs), can help relieve pain and inflammation.  Steroid injections. Corticosteroid, or cortisone, is a powerful anti-inflammatory agent that can be injected into the tendon sheath at the base of the affected digit. In many cases, a steroid injection can resolve the condition. If symptoms do not improve with one injection or improve but then come back after a period of time, a second injection may be given. If two injections do not help the problem, surgery is often recommended.   Steroid injections are less likely to be effective in patients with diabetes but may still help avoid surgery. They can cause a short-term rise in blood sugar, so glucose levels in diabetic patients should be monitored closely after injection.

## 2022-06-22 NOTE — Progress Notes (Signed)
    SUBJECTIVE:   CHIEF COMPLAINT / HPI:   DM2: She is compliant with her lifestyle modification. Not on meds. Here for f/u.  HTN/HLD: Compliant with Losartan 100 mg QD and Crestor 10 mg QD. She stopped taking Zetia due to confusion about the instructions provided. Her home BP has been good. She is concerned that Crestor will make her DM worsens as she read it causes hyperglycemia.  COPD: She is doing well on her inhalers. No concerns.  Itchy scalp: C/O itchy scalp for a few weeks. She felt this was due to her starting a Biotin supplement. She has now been off biotin for one week. However, she still itches. Her scalp itching is worse when she sweats.  Finger locking: C/O worsening left 1st and 5th finger. There is an increase in the frequency and duration. She experiences intense pain when her finger locks on her. Sometimes, she drops any object she was holding prior to symptoms onset. She is now experiencing a similar presentation in her right hand and sometimes feet.   PERTINENT  PMH / PSH: PMHx reviewed  OBJECTIVE:   BP 136/68   Pulse 72   Ht 4\' 11"  (1.499 m)   Wt 146 lb (66.2 kg)   LMP 12/27/2011   SpO2 100%   BMI 29.49 kg/m   Physical Exam Vitals and nursing note reviewed.  Cardiovascular:     Rate and Rhythm: Normal rate and regular rhythm.     Heart sounds: Normal heart sounds. No murmur heard. Pulmonary:     Effort: Pulmonary effort is normal. No respiratory distress.     Breath sounds: Normal breath sounds. No wheezing.  Abdominal:     General: Bowel sounds are normal. There is no distension.     Palpations: Abdomen is soft. There is no mass.  Musculoskeletal:     Right hand: Normal.     Left hand: Normal.  Skin:    Comments: Scalp looks great with no dryness or scaling.      ASSESSMENT/PLAN:   Diabetes mellitus A1C improved today from 6.9 to 6.8 Continue diet control and lifestyle modification. F/U in 3 months for reassessment. Glucometer escribed  to monitor glucose at home.  Hyperlipidemia associated with type 2 diabetes mellitus (HCC) Counseling provided regarding Crestor and risk for DM. She already has DM. We will monitor Glucose closely. Continue Crestor and Zetia. She agreed with the plan.  Hypertension BP looks great on Losartan 100 mg every day. Continue same.   COPD (chronic obstructive pulmonary disease) (HCC) Stable on her current regimen.  Trigger finger Conservative measures (massage, exercise, NSAID, muscle relaxant) discussed with she had tried in the pass. She wants a second opinion and requested referral to a specialist. Referral to hand specialist ordered.   Itchy scalp: Scalp looks healthy. Continue to hold biotin supplement. Consider selsium blue if no improvement. She agreed with the plan.  Janit Pagan, MD [facility]

## 2022-06-22 NOTE — Progress Notes (Signed)
   S:   Chief Complaint  Patient presents with   Medication Management    Tobacco Cessation   Betty Chapman is a 62 y.o. female who presents for evaluation/assistance with tobacco dependence.  PMH is significant for COPD, DM, CHF, HLD, and tobacco use disorder.   Patient was referred and last seen by Primary Care Provider, Dr. Lum Babe, on 06/22/22.   At last visit with the pharmacy team, she reported she was somewhat willing to cut down her cigarette intake.   Today she reports she is not taking her montelukast, but is using her daily maintenance inhalers. She states she is not sure she is desiring to quit at this time. She does not feel that she would be able to achieve complete tobacco cessation at any point.   Started smoking at ~62 YO. Number of cigarettes/day 8-10.  Estimated nicotine content per cigarette (mg).  Estimated nicotine intake per day 10 mg.   Denies waking to smoke.  Medications used in past cessation efforts include: varenicline (has at home but has not started because she didn't want to take medications).   Rates CONFIDENCE of reducing tobacco intake further to 7 cigs per day by the end of the year on 1-10 scale of 7.  Most common triggers to use tobacco include: coffee in the morning, alcohol, boredom    Motivation to quit: health   O: Clinical ASCVD: No  The 10-year ASCVD risk score (Arnett DK, et al., 2019) is: 19.8%   Values used to calculate the score:     Age: 62 years     Sex: Female     Is Non-Hispanic African American: No     Diabetic: Yes     Tobacco smoker: Yes     Systolic Blood Pressure: 136 mmHg     Is BP treated: Yes     HDL Cholesterol: 64 mg/dL     Total Cholesterol: 192 mg/dL  Review of Systems  Respiratory:  Positive for cough, shortness of breath and wheezing.   All other systems reviewed and are negative.   Physical Exam Pulmonary:     Breath sounds: Wheezing present.  Neurological:     Mental Status: She is alert.   Psychiatric:        Mood and Affect: Mood normal.        Behavior: Behavior normal.        Thought Content: Thought content normal.        Judgment: Judgment normal.    A/P: Tobacco use disorder with moderate nicotine dependence of years duration in a patient who is fair candidate for success because of her progress thus far.  She agrees to attempt cut try to cut down to 1 pack every 3 days by the end of December 2024. - Has varenicline in her possession but is not interested in using "another medication" at this time.  Written patient instructions provided. Patient verbalized understanding of treatment plan.  Total time in face to face counseling 16 minutes.    Follow-up:  Pharmacist 6 months or sooner if patient requests. PCP clinic visit 3 months planned Patient seen with Lily Peer, PharmD, PGY-1 resident.

## 2022-06-22 NOTE — Assessment & Plan Note (Signed)
BP looks great on Losartan 100 mg every day. Continue same.

## 2022-06-22 NOTE — Assessment & Plan Note (Signed)
Counseling provided regarding Crestor and risk for DM. She already has DM. We will monitor Glucose closely. Continue Crestor and Zetia. She agreed with the plan.

## 2022-06-22 NOTE — Assessment & Plan Note (Signed)
Stable on her current regimen. 

## 2022-06-22 NOTE — Patient Instructions (Signed)
Tobacco Patient Instructions  Please follow up in about 3 months.   Quitting smoking is one of the most important decisions you can make for your current and future health. Consider what you dislike about smoking and how quitting could personally benefit you. Try to cut down. Aim for reducing the amount you smoke by one pack every 3 days by the end of the year.   Starting today, Be a Quitter!  Remind yourself why you want to quit.  Delay your first cigarette of the day for as long as possible.  Start cleaning out all pockets, drawers, and your car of cigarettes.  Getting Through the Cravings Once You Are Smoke Free: Each craving will last about 10 minutes, whether or not you smoke. Here's how to get through the cravings without cigarettes:  DELAY: Tell yourself that you'll wait for the next craving. Do it every time! DEEP BREATHS: One reason smoking feels good is because you breathe in deeply to inhale. Take four slow, deep breaths and feel the relaxation without the hamful effects of cigarettes. DRINK WATER: Drink a glass of cool water. It will give your hands and mouth something to do and will help flush the nicotine out of your system faster. DIVERT: Do something else -- brush your teeth, take a walk, call a friend who can offer you support. Just moving onto something other than thinking about cigarettes will move you through the craving.   Frequently Asked Questions  What can I do when I get the urge to smoke? To get through the urge to smoke, try the following:  Review your reasons for quitting and think of all the benefits to your health, your finances, and your family.  Remind yourself that there is no such thing as just one cigarette -- or even one puff.  Ride out the desire to smoke. Use the 4 Os -- Delay, Deep Breaths, Drink Water and Divert to get you through. The craving will go away eventually. Do not fool yourself into thinking you can have just one cigarette.  Any tips on  how to deal with stress? Stress is a natural part of life. The key is to deal with it without reaching for a cigarette. Taking deep breaths, counting backwards from 10 and asking yourself 1-how big a deal is this?"  Writing down your feelings, talking with a friend and doing things like positive self-talk and meditation are some other ways that people deal with daily stress.  What if I start smoking again? Slips happen. Most people try to quit smoking a few times before they are successful. Don't beat yourself up if this happens to you! Ask yourself if this was a slip or a relapse. A slip is a one-time mistake that is quickly corrected. A relapse is going back to your old smoking habits.   If you slip, don't give up. Think of it as a learning experience. Ask yourself what went wrong and renew your commitment to staying away from smoking for good.  If you relapse, try not to get discouraged. Ask yourself the question "What caused me to start smoking?" Figure out what helped you and what didn't when you tried to quit. Knowing why you relapsed is useful information for your next attempt to quit.

## 2022-06-23 ENCOUNTER — Other Ambulatory Visit: Payer: Self-pay | Admitting: Family Medicine

## 2022-06-23 DIAGNOSIS — E1169 Type 2 diabetes mellitus with other specified complication: Secondary | ICD-10-CM

## 2022-06-23 NOTE — Progress Notes (Signed)
Reviewed and agree with Dr Koval's plan.   

## 2022-06-28 ENCOUNTER — Telehealth: Payer: Self-pay

## 2022-06-28 NOTE — Telephone Encounter (Signed)
Spoke with patient. She informed me that she no longer want to have her ONE TOUCH DELICA PLUS 30G or 33G lancets refilled. Cause she is not going to be checking her levels herself. She stated that she has already discussed this with Dr. Lum Babe. Aquilla Solian, CMA

## 2022-07-14 ENCOUNTER — Ambulatory Visit
Admission: RE | Admit: 2022-07-14 | Discharge: 2022-07-14 | Disposition: A | Discharge status: Home / Self Care | Payer: 59 | Source: Ambulatory Visit | Attending: Family Medicine | Admitting: Family Medicine

## 2022-07-14 DIAGNOSIS — Z1231 Encounter for screening mammogram for malignant neoplasm of breast: Secondary | ICD-10-CM

## 2022-07-19 ENCOUNTER — Other Ambulatory Visit: Payer: Self-pay | Admitting: Family Medicine

## 2022-07-23 ENCOUNTER — Telehealth: Payer: Self-pay | Admitting: Family Medicine

## 2022-07-23 ENCOUNTER — Ambulatory Visit (INDEPENDENT_AMBULATORY_CARE_PROVIDER_SITE_OTHER): Payer: 59 | Admitting: Family Medicine

## 2022-07-23 ENCOUNTER — Encounter: Payer: Self-pay | Admitting: Family Medicine

## 2022-07-23 VITALS — BP 135/61 | HR 79 | Ht <= 58 in | Wt 151.0 lb

## 2022-07-23 DIAGNOSIS — E119 Type 2 diabetes mellitus without complications: Secondary | ICD-10-CM

## 2022-07-23 DIAGNOSIS — E1169 Type 2 diabetes mellitus with other specified complication: Secondary | ICD-10-CM | POA: Diagnosis not present

## 2022-07-23 DIAGNOSIS — F17200 Nicotine dependence, unspecified, uncomplicated: Secondary | ICD-10-CM | POA: Diagnosis not present

## 2022-07-23 DIAGNOSIS — I10 Essential (primary) hypertension: Secondary | ICD-10-CM

## 2022-07-23 DIAGNOSIS — Z122 Encounter for screening for malignant neoplasm of respiratory organs: Secondary | ICD-10-CM

## 2022-07-23 DIAGNOSIS — M79671 Pain in right foot: Secondary | ICD-10-CM | POA: Insufficient documentation

## 2022-07-23 HISTORY — DX: Pain in right foot: M79.671

## 2022-07-23 MED ORDER — FREESTYLE LIBRE 2 READER DEVI: 12 refills | Status: DC

## 2022-07-23 MED ORDER — FREESTYLE LIBRE 2 SENSOR MISC: 12 refills | Status: DC

## 2022-07-23 NOTE — Telephone Encounter (Signed)
HIPAA compliant callback message left  Please advise her that I ordered her CGM and messaged out pharmacy tech regarding prior authorization. Encourage her to call her insurance to discuss coverage as well.

## 2022-07-23 NOTE — Patient Instructions (Signed)
Plantar Fasciitis  Plantar fasciitis is a painful foot condition that affects the heel. It occurs when the band of tissue that connects the toes to the heel bone (plantar fascia) becomes irritated. This can happen as the result of exercising too much or doing other repetitive activities (overuse injury). Plantar fasciitis can cause mild irritation to severe pain that makes it difficult to walk or move. The pain is usually worse in the morning after sleeping, or after sitting or lying down for a period of time. Pain may also be worse after long periods of walking or standing. What are the causes? This condition may be caused by: Standing for long periods of time. Wearing shoes that do not have good arch support. Doing activities that put stress on joints (high-impact activities). This includes ballet and exercise that makes your heart beat faster (aerobic exercise), such as running. Being overweight. An abnormal way of walking (gait). Tight muscles in the back of your lower leg (calf). High arches in your feet or flat feet. Starting a new athletic activity. What are the signs or symptoms? The main symptom of this condition is heel pain. Pain may get worse after the following: Taking the first steps after a time of rest, especially in the morning after awakening, or after you have been sitting or lying down for a while. Long periods of standing still. Pain may decrease after 30-45 minutes of activity, such as gentle walking. How is this diagnosed? This condition may be diagnosed based on your medical history, a physical exam, and your symptoms. Your health care provider will check for: A tender area on the bottom of your foot. A high arch in your foot or flat feet. Pain when you move your foot. Difficulty moving your foot. You may have imaging tests to confirm the diagnosis, such as: X-rays. Ultrasound. MRI. How is this treated? Treatment for plantar fasciitis depends on how severe your  condition is. Treatment may include: Rest, ice, pressure (compression), and raising (elevating) the affected foot. This is called RICE therapy. Your health care provider may recommend RICE therapy along with over-the-counter pain medicines to manage your pain. Exercises to stretch your calves and your plantar fascia. A splint that holds your foot in a stretched, upward position while you sleep (night splint). Physical therapy to relieve symptoms and prevent problems in the future. Injections of steroid medicine (cortisone) to relieve pain and inflammation. Stimulating your plantar fascia with electrical impulses (extracorporeal shock wave therapy). This is usually the last treatment option before surgery. Surgery, if other treatments have not worked after 12 months. Follow these instructions at home: Managing pain, stiffness, and swelling  If directed, put ice on the painful area. To do this: Put ice in a plastic bag, or use a frozen bottle of water. Place a towel between your skin and the bag or bottle. Roll the bottom of your foot over the bag or bottle. Do this for 20 minutes, 2-3 times a day. Wear athletic shoes that have air-sole or gel-sole cushions, or try soft shoe inserts that are designed for plantar fasciitis. Elevate your foot above the level of your heart while you are sitting or lying down. Activity Avoid activities that cause pain. Ask your health care provider what activities are safe for you. Do physical therapy exercises and stretches as told by your health care provider. Try activities and forms of exercise that are easier on your joints (low impact). Examples include swimming, water aerobics, and biking. General instructions Take over-the-counter   and prescription medicines only as told by your health care provider. Wear a night splint while sleeping, if told by your health care provider. Loosen the splint if your toes tingle, become numb, or turn cold and blue. Maintain a  healthy weight, or work with your health care provider to lose weight as needed. Keep all follow-up visits. This is important. Contact a health care provider if you have: Symptoms that do not go away with home treatment. Pain that gets worse. Pain that affects your ability to move or do daily activities. Summary Plantar fasciitis is a painful foot condition that affects the heel. It occurs when the band of tissue that connects the toes to the heel bone (plantar fascia) becomes irritated. Heel pain is the main symptom of this condition. It may get worse after exercising too much or standing still for a long time. Treatment varies, but it usually starts with rest, ice, pressure (compression), and raising (elevating) the affected foot. This is called RICE therapy. Over-the-counter medicines can also be used to manage pain. This information is not intended to replace advice given to you by your health care provider. Make sure you discuss any questions you have with your health care provider. Document Revised: 04/09/2019 Document Reviewed: 04/09/2019 Elsevier Patient Education  2024 [employer].  

## 2022-07-23 NOTE — Assessment & Plan Note (Signed)
Continue diet control I will escribe CGM and see if her insurance will cover it Foot exam looks benign with no concern for DM neuropathy today Continue diet control and home glucose monitoring

## 2022-07-23 NOTE — Progress Notes (Addendum)
    SUBJECTIVE:   CHIEF COMPLAINT / HPI:   Right heel pain: C/O right heel pain, which started about a week ago when she woke from sleep. The pain was about 10/10 in severity at the time. She used her muscle relaxant and tiger balm soak, staying off her heel to relieve pain. She has been massaging her heel as well. Pain now is about 6/10 in severity.  DM2: Diet control. She wishes to have a CGM device but was informed she needed a higher A1C to get this. This upsets her.  HTN: She is compliant with Losartan 100 mg every day. Home BP has been in normal range.  PERTINENT  PMH / PSH: PMHx reviewed  OBJECTIVE:   BP 135/61   Pulse 79   Ht 4\' 9"  (1.448 m)   Wt 151 lb (68.5 kg)   LMP 12/27/2011   SpO2 100%   BMI 32.68 kg/m   Physical Exam Constitutional:      Appearance: Normal appearance.  Cardiovascular:     Rate and Rhythm: Normal rate.     Heart sounds: Normal heart sounds. No murmur heard. Pulmonary:     Effort: Pulmonary effort is normal.     Breath sounds: Normal breath sounds. No wheezing.  Musculoskeletal:       Legs:     Comments: Mild tenderness of the posterior medial border of her right heel. No deformity, redness or swelling  Neurological:     Mental Status: She is alert.      ASSESSMENT/PLAN:   Pain of right heel Likely plantar fascitis Symptoms improving with conservative measures PT referral discussed, but agreed to defer for now Continue home massaging and muscle relaxants/Ibuprofen as needed Consider PT and Podiatry referral in the future She verbalized understanding   Diabetes mellitus Continue diet control I will escribe CGM and see if her insurance will cover it Foot exam looks benign with no concern for DM neuropathy today Continue diet control and home glucose monitoring  Hypertension BP improved to her goal with repeat check Continue Losartan 100 mg every day Monitor BP closely at home  Tobacco use disorder Due for her yearly lung  cancer screen in August As discussed with her, our staff will reach out to her to schedule an appointment She agreed with the plan   New telephone number: [phone] Patient requested an update  Janit Pagan, MD [facility]

## 2022-07-23 NOTE — Assessment & Plan Note (Signed)
BP improved to her goal with repeat check Continue Losartan 100 mg every day Monitor BP closely at home

## 2022-07-23 NOTE — Assessment & Plan Note (Signed)
Due for her yearly lung cancer screen in August As discussed with her, our staff will reach out to her to schedule an appointment She agreed with the plan

## 2022-07-23 NOTE — Assessment & Plan Note (Signed)
Likely plantar fascitis Symptoms improving with conservative measures PT referral discussed, but agreed to defer for now Continue home massaging and muscle relaxants/Ibuprofen as needed Consider PT and Podiatry referral in the future She verbalized understanding

## 2022-07-26 NOTE — Telephone Encounter (Signed)
Patient returns call to nurse line.   Patient advised CGM was sent in and we are working on insurance coverage.   Patient was very Adult nurse.

## 2022-07-26 NOTE — Progress Notes (Signed)
Scheduled pt CT  appointment. I tried to contact the patient twice to inform her of this appt but she did not answer. Pt voice mail is not set up to receive messages.

## 2022-07-27 ENCOUNTER — Other Ambulatory Visit (HOSPITAL_COMMUNITY): Payer: Self-pay

## 2022-08-02 NOTE — Telephone Encounter (Signed)
Pharmacy Patient Advocate Encounter   Received notification from CoverMyMeds that prior authorization for FREESTYLE LIBRE 2 SENSOR is required/requested.   PA required; PA submitted to [facility] via CoverMyMeds Key/confirmation #/EOC [id number] Status is pending

## 2022-08-02 NOTE — Telephone Encounter (Signed)
Pharmacy Patient Advocate Encounter   Received notification from CoverMyMeds that prior authorization for FREESTYLE LIBRE 2 READER is required/requested.   PA required; PA submitted to [facility] via CoverMyMeds Key/confirmation #/EOC [id number] Status is pending

## 2022-08-05 NOTE — Telephone Encounter (Signed)
Pharmacy Patient Advocate Encounter  Received notification from [facility] that Prior Authorization for FREESTYLE LIBRE 2 SENSOR has been DENIED. Please advise how you'd like to proceed. Full denial letter will be uploaded to the media tab. See denial reason below.    PA #/Case ID/Reference #:  [id number]

## 2022-08-06 NOTE — Telephone Encounter (Signed)
Contacted the patient 2x to inform her about her glucose monitor, however patient did not answer. I could not leave a vm. Patient do have an appt on 08/13 @830 

## 2022-08-11 ENCOUNTER — Ambulatory Visit (INDEPENDENT_AMBULATORY_CARE_PROVIDER_SITE_OTHER): Payer: 59 | Admitting: Orthopedic Surgery

## 2022-08-11 ENCOUNTER — Other Ambulatory Visit (INDEPENDENT_AMBULATORY_CARE_PROVIDER_SITE_OTHER): Payer: 59

## 2022-08-11 DIAGNOSIS — M79642 Pain in left hand: Secondary | ICD-10-CM

## 2022-08-11 DIAGNOSIS — M79641 Pain in right hand: Secondary | ICD-10-CM | POA: Diagnosis not present

## 2022-08-11 NOTE — Progress Notes (Signed)
Betty Chapman - 62 y.o. female MRN [id number]  Date of birth: 04-29-1960  Office Visit Note: Visit Date: 08/11/2022 PCP: Doreene Eland, MD Referred by: Doreene Eland, MD  Subjective: Chief Complaint  Patient presents with   Right Hand - Pain   Left Hand - Pain   HPI: Betty Chapman is a pleasant 62 y.o. female who presents today for evaluation of intermittent "snapping" and "dropping" of her tendons, particularly of the left small finger.  She is noticing it on the right hand as well more recently.  No prior trauma, no history of rheumatoid.  Visit Reason: Bilateral hands Hand dominance: right Occupation: Disabled; hotel kitchen Diabetic: Yes / 6.9  Prior Testing: none Injections: none - does not want any  Treatments: none  Prior Surgery: none   *right- middle, left-thumb/small* *patient states her finger "falls" does not think it is triggering* *denies any blood thinners/aspirin*  Pertinent ROS were reviewed with the patient and found to be negative unless otherwise specified above in HPI.   Assessment & Plan: Visit Diagnoses:  1. Bilateral hand pain     Plan: Extensive discussion was had with patient today.  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  Her examination today is fairly benign, she demonstrates appropriate range of motion of the digits, there is no notable triggering.  I am unable to elicit any significant tendon subluxation today, but based on her description, this appears to be what she is dealing with.  She is able to initiate MCP extension of all digits at this juncture.  We did discuss the potential association with rheumatoid arthritis and other inflammatory conditions, and may be in her better interest to have this ruled out in the future.  Should her tendon subluxation worsen or become more symptomatic, she is welcome to return back to me for repeat discussion regarding further treatment.  We  discussed the possibility of trigger digit in the future, however she is demonstrating pseudo triggering currently and does not require intervention for trigger digit release at this time.  Should she become more symptomatic in the future, she would be a reasonable candidate for extension splinting or potentially an extensor centralization procedure/realignment surgically.  Risk and benefits of the potential surgery were discussed in detail today, she expressed full understanding, return in the future as needed should her symptoms worsen.  Greater than 45 minutes was spent reviewing prior records, imaging and with examination/discussion with patient.  Follow-up: No follow-ups on file.   Meds & Orders: No orders of the defined types were placed in this encounter.   Orders Placed This Encounter  Procedures   XR Hand Complete Right   XR Hand Complete Left     Procedures: No procedures performed      Clinical History: No specialty comments available.  She reports that she has been smoking cigarettes. She started smoking about 52 years ago. She has a 52.6 pack-year smoking history. She has never used smokeless tobacco.  Recent Labs    11/03/21 0826 02/16/22 1200 06/22/22 0822  HGBA1C 6.4 6.9* 6.8    Objective:   Vital Signs: LMP 12/27/2011   Physical Exam  Gen: Well-appearing, in no acute distress; non-toxic CV: Regular Rate. Well-perfused. Warm.  Resp: Breathing unlabored on room air; no wheezing. Psych: Fluid speech in conversation; appropriate affect; normal thought process Neuro: Sensation intact throughout. No gross coordination deficits.   Ortho Exam PHYSICAL EXAM:  General: Patient is  well appearing and in no distress. Cervical spine mobility is full in all directions:  Skin and Muscle: No skin changes are apparent to upper extremities.  Muscle bulk and contour normal, no signs of atrophy.     Range of Motion and Palpation Tests: Mobility is full about the elbows  with flexion and extension.  Forearm supination and pronation are 85/85 bilaterally.  Wrist flexion/extension is 75/65 bilaterally.  Digital flexion and extension are full.  Thumb opposition is full to the base of the small fingers bilaterally.    No cords or nodules are palpated.  No triggering is observed.    Moderate tenderness over the thumb CMC articulations is observed.  No evidence of radiocarpal, midcarpal or intercarpal joint instability with provocation.  She is able to initiate MP extension of all digits, unable to elicit any significant extensor tendon subluxation into anterior metacarpal goalie.  This was checked with the wrist flexed and extended bilaterally.  She does have pain when extending the MCP joint against resistance particularly of the left small finger.  Neurologic, Vascular, Motor: Sensation is intact to light touch in the median/radial/ulnar distributions.  Tinel's testing negative at cubital and carpal tunnel.   Fingers pink and well perfused.  Capillary refill is brisk.      Lab Results  Component Value Date   HGBA1C 6.8 06/22/2022     Imaging: XR Hand Complete Right  Result Date: 08/11/2022 X-rays demonstrate significant degenerative changes at the thumb [facility] articulation with joint space narrowing, subchondral sclerosis and osteophyte formation.  There is degenerative change noted diffusely within the small joints as well.  XR Hand Complete Left  Result Date: 08/11/2022 3 views of the left hand were completed today X-rays demonstrate significant degenerative changes at the thumb [facility] articulation with joint space narrowing, subchondral sclerosis and osteophyte formation.  There is degenerative change noted diffusely within the small joints as well.   Past Medical/Family/Surgical/Social History: Medications & Allergies reviewed per EMR, new medications updated. Patient Active Problem List   Diagnosis Date Noted   Pain of right heel 07/23/2022   Trigger  finger 06/22/2022   Coronary artery calcification seen on CAT scan 09/08/2021   Stress incontinence 04/28/2021   DJD of left shoulder 10/14/2020   COVID-19 virus infection    Hyperlipidemia associated with type 2 diabetes mellitus (HCC) 02/06/2019   Allergy to statin medication 06/13/2018   Diastolic CHF (HCC) 10/11/2017   Lipoma of arm 05/17/2017   GERD (gastroesophageal reflux disease) 11/23/2016   Diabetes mellitus (HCC) 07/16/2014   Schatzki's ring    COPD (chronic obstructive pulmonary disease) (HCC) 02/08/2012   Chronic back pain 07/27/2010   Obese 06/04/2008   Transient cerebral ischemia 06/04/2008   Tobacco use disorder 03/01/2008   Hypertension 01/30/2008   Past Medical History:  Diagnosis Date   Blurry vision, bilateral 03/20/2014   Callus of foot 07/16/2014   COPD (chronic obstructive pulmonary disease) (HCC)    COVID-19 virus infection    Dysphagia, pharyngoesophageal phase    Epidermal cyst of face 07/16/2014   Hypercholesterolemia    Hypertension    TIA (transient ischemic attack)    Once   Tobacco abuse    Unintended weight loss 02/06/2019   Family History  Problem Relation Age of Onset   Aneurysm Mother        brain, cause of death   Cancer Brother        Dies of cancer, unsure what primary site   Prostate cancer Brother  Hypertension Daughter    Diabetes Daughter    Colon cancer Neg Hx    Breast cancer Neg Hx    Past Surgical History:  Procedure Laterality Date   BTL     CHOLECYSTECTOMY     COLONOSCOPY N/A 06/10/2014   Procedure: COLONOSCOPY;  Surgeon: Corbin Ade, MD;  Location: [facility];  Service: Endoscopy;  Laterality: N/A;  1315   ECTOPIC PREGNANCY SURGERY     ESOPHAGOGASTRODUODENOSCOPY N/A 06/10/2014   Procedure: ESOPHAGOGASTRODUODENOSCOPY (EGD);  Surgeon: Corbin Ade, MD;  Location: [facility];  Service: Endoscopy;  Laterality: N/A;   MALONEY DILATION N/A 06/10/2014   Procedure: Elease Hashimoto DILATION;  Surgeon: Corbin Ade, MD;   Location: [facility];  Service: Endoscopy;  Laterality: N/A;   TUBAL LIGATION     Social History   Occupational History   Not on file  Tobacco Use   Smoking status: Every Day    Current packs/day: 1.00    Average packs/day: 1 pack/day for 52.6 years (52.6 ttl pk-yrs)    Types: Cigarettes    Start date: 01/04/1970   Smokeless tobacco: Never   Tobacco comments:    Cut down from 1 PPD to 0.5 PPD  Vaping Use   Vaping status: Never Used  Substance and Sexual Activity   Alcohol use: Yes    Alcohol/week: 0.0 standard drinks of alcohol    Comment: rarely    Drug use: Yes    Types: Marijuana    Comment: Occasionally   Sexual activity: Not Currently    Birth control/protection: Surgical    Zyrell Carmean Fara Boros) Denese Killings, M.D. [facility] 9:00 AM

## 2022-08-16 ENCOUNTER — Ambulatory Visit (HOSPITAL_COMMUNITY): Payer: 59

## 2022-08-17 ENCOUNTER — Other Ambulatory Visit: Payer: Self-pay | Admitting: Family Medicine

## 2022-08-17 ENCOUNTER — Encounter: Payer: Self-pay | Admitting: Pharmacist

## 2022-08-17 ENCOUNTER — Ambulatory Visit: Payer: 59 | Admitting: Pharmacist

## 2022-08-17 ENCOUNTER — Ambulatory Visit (INDEPENDENT_AMBULATORY_CARE_PROVIDER_SITE_OTHER): Payer: 59 | Admitting: Family Medicine

## 2022-08-17 ENCOUNTER — Encounter: Payer: Self-pay | Admitting: Family Medicine

## 2022-08-17 VITALS — BP 129/69 | HR 70 | Ht <= 58 in | Wt 148.2 lb

## 2022-08-17 DIAGNOSIS — E785 Hyperlipidemia, unspecified: Secondary | ICD-10-CM

## 2022-08-17 DIAGNOSIS — F17200 Nicotine dependence, unspecified, uncomplicated: Secondary | ICD-10-CM

## 2022-08-17 DIAGNOSIS — M79641 Pain in right hand: Secondary | ICD-10-CM

## 2022-08-17 DIAGNOSIS — E6609 Other obesity due to excess calories: Secondary | ICD-10-CM

## 2022-08-17 DIAGNOSIS — M79642 Pain in left hand: Secondary | ICD-10-CM | POA: Diagnosis not present

## 2022-08-17 DIAGNOSIS — E1169 Type 2 diabetes mellitus with other specified complication: Secondary | ICD-10-CM | POA: Diagnosis not present

## 2022-08-17 DIAGNOSIS — M79643 Pain in unspecified hand: Secondary | ICD-10-CM

## 2022-08-17 DIAGNOSIS — Z6830 Body mass index (BMI) 30.0-30.9, adult: Secondary | ICD-10-CM | POA: Diagnosis not present

## 2022-08-17 HISTORY — DX: Pain in unspecified hand: M79.643

## 2022-08-17 MED ORDER — EZETIMIBE 10 MG PO TABS: 10.0000 mg | ORAL_TABLET | Freq: Every day | ORAL | 1 refills | Status: DC

## 2022-08-17 MED ORDER — ROSUVASTATIN CALCIUM 10 MG PO TABS: 10.0000 mg | ORAL_TABLET | Freq: Every day | ORAL | 2 refills | Status: DC

## 2022-08-17 NOTE — Patient Instructions (Addendum)
It was nice to see you today!  You are doing well with your goal to keep you intake to 1/2 ppd   Let's keep working to decrease your intake to 1 pack every three days by the end of the year  Your goal blood sugar is 80-130 before eating and less than 180 after eating.  Medication Changes: Continue all other medication the same. None today - We should plan to discuss drug options at that time.    Keep up the good work with diet and exercise. Aim for a diet full of vegetables, fruit and lean meats (chicken, [place], fish). Try to limit salt intake by eating fresh or frozen vegetables (instead of canned), rinse canned vegetables prior to cooking and do not add any additional salt to meals.   Please make appointment for October with me at your next visit with Dr. Lum Babe.

## 2022-08-17 NOTE — Progress Notes (Signed)
   S:   Chief Complaint  Patient presents with   Medication Management    Diabetes and Tobacco Use   62 y.o. female who presents for evaluation/assistance with Diabetes/CGM rejection by insurance and tobacco dependence.  PMH is significant for COPD, DM, CHF, HLD, and tobacco use disorder.   Patient was referred and last seen by Primary Care Provider, Dr. Nunzio Cory today.  At last visit, we discussed continued intake reduction plan but patient was also clear that she did not intend to work toward complete cessation.   Estimated current nicotine Intake per day 20 mg.    Rates CONFIDENCE of reducing intake by the end of the year to 1 pack every three days was rated as a 4/10.   Most common triggers to use tobacco include; Stress.  Reports high level of stress currently due to the overnight hospitalization of her brother.    Motivation to quit: breathing.   O: Review of Systems  Respiratory:  Positive for cough, sputum production and shortness of breath.   Psychiatric/Behavioral:  The patient is nervous/anxious.        Agitation with stress  All other systems reviewed and are negative.   Physical Exam Vitals reviewed.  Pulmonary:     Effort: Pulmonary effort is normal.  Neurological:     Mental Status: She is alert.  Psychiatric:        Mood and Affect: Mood normal.    Seen today by PCP, Dr. Lum Babe  Patient is participating in a Managed Medicaid Plan:  Yes   A/P: Tobacco use disorder with moderate nicotine dependence of many years duration in a patient who is fair candidate for success because of her progress to date.  Reluctant to use any medication to assist with intake reduction.  She has made minimal progress since last appointment but agrees to attempt to cut down to 1 pack every 3 days by the end of December 2024.  - Has varenicline in her possession but is not interested in using "another medication" at this time. - Readdress use of varenicline or other  tobacco cessation option at next visit in ~ 1 month.   Diabetes stable - uninterested in sticking fingers for blood sugar evaluation.  CGM not covered by her insurance. Continue to encourage efforts with diet and exercise.   Hyperlipidemia - chronic  Requested refill - provided.    Written patient instructions provided. Patient verbalized understanding of treatment plan.  Total time in face to face counseling 24 minutes.    Follow-up:  Pharmacist October (asked patient to schedule with me at next PCP visit in September) PCP clinic visit 1 month.

## 2022-08-17 NOTE — Assessment & Plan Note (Signed)
Thought to be triggered finger I reviewed her hand specialist's note and they thought this might be sagittal band instability Different treatment options discussed and she chose conservative measures for now Xray reviewed with multiple points of DJD - ?? OA vs RA Discussed RA lab today She'd defer to next visit in Sept F/U sooner if needed

## 2022-08-17 NOTE — Assessment & Plan Note (Signed)
Tobacco use disorder with moderate nicotine dependence of many years duration in a patient who is fair candidate for success because of her progress to date.  Reluctant to use any medication to assist with intake reduction.  She has made minimal progress since last appointment but agrees to attempt to cut down to 1 pack every 3 days by the end of December 2024.  - Has varenicline in her possession but is not interested in using "another medication" at this time. - Readdress use of varenicline or other tobacco cessation option at next visit in ~ 1 month.

## 2022-08-17 NOTE — Assessment & Plan Note (Signed)
Tobacco use disorder with moderate nicotine dependence of many years duration in a patient who is fair candidate for success because of her progress to date.  Reluctant to use any medication to assist with intake reduction.  She has made minimal progress since last appointment but agrees to attempt to cut down to 1 pack every 3 days by the end of December 2024.  - Has varenicline in her possession but is not interested in using "another medication" at this time. - Readdress use of varenicline or other tobacco cessation option at next visit in ~ 1 month.   Diabetes stable - uninterested in sticking fingers for blood sugar evaluation.  CGM not covered by her insurance. Continue to encourage efforts with diet and exercise.

## 2022-08-17 NOTE — Progress Notes (Signed)
    SUBJECTIVE:   CHIEF COMPLAINT / HPI:   DM2:  Off meds. She is concerned about insurance disapproval of her CGM device request.  Hand pain: B/L hand pain with intermittent weakness/dropping and snapping, which has been ongoing for a while. She was recently evaluated by ortho with treatment option discussion. She feels like her symptoms is stable and not unbearable with the home hand exercise she does routinely. No other cncerns today.  Weight concerns: She is concerned about intermittent weight gain.   PERTINENT  PMH / PSH: PMHx reviewed  OBJECTIVE:   BP 129/69   Pulse 70   Ht 4\' 9"  (1.448 m)   Wt 148 lb 3.2 oz (67.2 kg)   LMP 12/27/2011   SpO2 100%   BMI 32.07 kg/m   Physical Exam Vitals and nursing note reviewed.  Cardiovascular:     Rate and Rhythm: Normal rate and regular rhythm.     Heart sounds: Normal heart sounds. No murmur heard. Pulmonary:     Effort: Pulmonary effort is normal. No respiratory distress.     Breath sounds: Normal breath sounds. No wheezing.  Musculoskeletal:     Right wrist: Normal.     Left wrist: Normal.      ASSESSMENT/PLAN:   Hand pain Thought to be triggered finger I reviewed her hand specialist's note and they thought this might be sagittal band instability Different treatment options discussed and she chose conservative measures for now Xray reviewed with multiple points of DJD - ?? OA vs RA Discussed RA lab today She'd defer to next visit in Sept F/U sooner if needed  Obese Weight reviewed and is actually stable for now We will discuss in details at her next visit She might benefit from GLP1 although she does not like to take too much medications I will discussed this at her next appointment in Sept   Flu shot discussed. She declined.   Janit Pagan, MD [facility]

## 2022-08-17 NOTE — Patient Instructions (Signed)
Lung Cancer Screening A lung cancer screening is a test that checks for lung cancer when there are no symptoms or history of that disease. The screening is done to look for lung cancer in its very early stages. Finding cancer early improves the chances of successful treatment. It may save your life. Who should have a screening? You should be screened for lung cancer if all of these apply: You currently smoke or you used to smoke. You are between the ages of 62 and 31 years old. Screening may be recommended up to age 62 depending on your overall health and other factors. You have a smoking history of 1 pack of cigarettes a day for 20 years or 2 packs a day for 10 years. How is screening done?  The recommended screening test is a low-dose computed tomography (LDCT) scan. This scan takes detailed images of the lungs. This allows a health care provider to look for abnormal cells. If you are at risk for lung cancer, it is recommended that you get screened once a year. Talk to your health care provider about the risks, benefits, and limitations of screening. What are the benefits of screening? Screening can find lung cancer early, before symptoms start and before it has spread outside of the lungs. The chances of curing lung cancer are greater if the cancer is diagnosed early. What are the risks of screening? The screening may show lung cancer when no cancer is present. Talk with your health care provider about what your results mean. In some cases, your health care provider may do more testing to confirm the results. The screening may not find lung cancer when it is present. You will be exposed to radiation from repeated LDCT tests, which can cause cancer in otherwise healthy people. How can I lower my risk of lung cancer? Make these lifestyle changes to lower your risk of developing lung cancer: Do not use any products that contain nicotine or tobacco. These products include cigarettes, chewing  tobacco, and vaping devices, such as e-cigarettes. If you need help quitting, ask your health care provider. Avoid secondhand smoke. Avoid exposure to radiation. Avoid exposure to radon gas. Have your home checked for radon regularly. Avoid things that cause cancer (carcinogens). Avoid living or working in places with high air pollution or diesel exhaust. Questions to ask your health care provider Am I eligible for lung cancer screening? Does my health insurance cover the cost of lung cancer screening? What happens if the lung cancer screening shows something of concern? How soon will I have results from my lung cancer screening? Is there anything that I need to do to prepare for my lung cancer screening? What happens if I decide not to have lung cancer screening? Where to find more information Ask your health care provider about the risks and benefits of screening. More information and resources are available from these organizations: American Cancer Society (ACS): cancer.org American Lung Association: lung.org National Cancer Institute: cancer.gov Contact a health care provider if: You start to show symptoms of lung cancer, including: A cough that will not go away. High-pitched whistling sounds when you breathe, most often when you breathe out (wheezing). Chest pain. Coughing up blood. Shortness of breath. Weight loss that cannot be explained. Constant tiredness (fatigue). Hoarse voice. Summary Lung cancer screening may find lung cancer before symptoms appear. Finding cancer early improves the chances of successful treatment. It may save your life. The recommended screening test is a low-dose computed tomography (LDCT) scan that  looks for abnormal cells in the lungs. If you are at risk for lung cancer, it is recommended that you get screened once a year. You can make lifestyle changes to lower your risk of lung cancer. Ask your health care provider about the risks and benefits of  screening. This information is not intended to replace advice given to you by your health care provider. Make sure you discuss any questions you have with your health care provider. Document Revised: 12/29/2021 Document Reviewed: 06/11/2020 Elsevier Patient Education  2024 [employer].

## 2022-08-17 NOTE — Assessment & Plan Note (Signed)
Weight reviewed and is actually stable for now We will discuss in details at her next visit She might benefit from GLP1 although she does not like to take too much medications I will discussed this at her next appointment in Sept

## 2022-08-18 NOTE — Progress Notes (Signed)
Reviewed and agree with Dr Koval's plan.   

## 2022-08-24 ENCOUNTER — Ambulatory Visit (HOSPITAL_COMMUNITY)
Admission: RE | Admit: 2022-08-24 | Discharge: 2022-08-24 | Disposition: A | Discharge status: Home / Self Care | Payer: 59 | Source: Ambulatory Visit | Attending: Family Medicine | Admitting: Family Medicine

## 2022-08-24 DIAGNOSIS — Z122 Encounter for screening for malignant neoplasm of respiratory organs: Secondary | ICD-10-CM | POA: Diagnosis not present

## 2022-08-24 DIAGNOSIS — Z87891 Personal history of nicotine dependence: Secondary | ICD-10-CM | POA: Insufficient documentation

## 2022-08-24 DIAGNOSIS — F17210 Nicotine dependence, cigarettes, uncomplicated: Secondary | ICD-10-CM | POA: Diagnosis not present

## 2022-08-25 ENCOUNTER — Ambulatory Visit (INDEPENDENT_AMBULATORY_CARE_PROVIDER_SITE_OTHER): Payer: 59

## 2022-08-25 VITALS — Ht <= 58 in | Wt 148.0 lb

## 2022-08-25 DIAGNOSIS — Z0000 Encounter for general adult medical examination without abnormal findings: Secondary | ICD-10-CM | POA: Diagnosis not present

## 2022-08-25 NOTE — Patient Instructions (Signed)
Betty Chapman , Thank you for taking time to come for your Medicare Wellness Visit. I appreciate your ongoing commitment to your health goals. Please review the following plan we discussed and let me know if I can assist you in the future.   Referrals/Orders/Follow-Ups/Clinician Recommendations: Aim for 30 minutes of exercise or brisk walking, 6-8 glasses of water, and 5 servings of fruits and vegetables each day.  This is a list of the screening recommended for you and due dates:  Health Maintenance  Topic Date Due   Screening for Lung Cancer  08/19/2022   COVID-19 Vaccine (1 - 2023-24 season) 07/05/2023*   Flu Shot  09/05/2023*   Hemoglobin A1C  09/22/2022   Yearly kidney health urinalysis for diabetes  10/07/2022   Yearly kidney function blood test for diabetes  01/08/2023   Eye exam for diabetics  03/03/2023   Complete foot exam   07/23/2023   Medicare Annual Wellness Visit  08/25/2023   Pap Smear  04/23/2024   Colon Cancer Screening  06/09/2024   Mammogram  07/13/2024   DTaP/Tdap/Td vaccine (3 - Td or Tdap) 07/03/2028   Hepatitis C Screening  Completed   HIV Screening  Completed   HPV Vaccine  Aged Out   Zoster (Shingles) Vaccine  Discontinued  *Topic was postponed. The date shown is not the original due date.    Advanced directives: (ACP Link)Information on Advanced Care Planning can be found at [facility] [place] Advance Health Care Directives Advance Health Care Directives ([email or web address]   Next Medicare Annual Wellness Visit scheduled for next year: Yes

## 2022-08-25 NOTE — Progress Notes (Signed)
Subjective:   Betty Chapman is a 62 y.o. female who presents for Medicare Annual (Subsequent) preventive examination.  Visit Complete: Virtual  I connected with  Betty Chapman on 08/25/22 by a audio enabled telemedicine application and verified that I am speaking with the correct person using two identifiers.  Patient Location: Home  Provider Location: Home Office  I discussed the limitations of evaluation and management by telemedicine. The patient expressed understanding and agreed to proceed.  Vital Signs: Because this visit was a virtual/telehealth visit, some criteria may be missing or patient reported. Any vitals not documented were not able to be obtained and vitals that have been documented are patient reported.   Review of Systems     Cardiac Risk Factors include: diabetes mellitus;sedentary lifestyle;smoking/ tobacco exposure;hypertension     Objective:    Today's Vitals   08/25/22 1127  Weight: 148 lb (67.1 kg)  Height: 4\' 9"  (1.448 m)   Body mass index is 32.03 kg/m.     08/17/2022    8:17 AM 07/23/2022    8:37 AM 06/22/2022    8:25 AM 02/16/2022    8:22 AM 01/26/2022    8:23 AM 01/07/2022    9:55 AM 11/17/2021   10:57 AM  Advanced Directives  Does Patient Have a Medical Advance Directive? No No No No No No No  Would patient like information on creating a medical advance directive? No - Patient declined No - Patient declined  No - Patient declined No - Patient declined No - Patient declined No - Patient declined    Current Medications (verified) Outpatient Encounter Medications as of 08/25/2022  Medication Sig   aspirin EC 81 MG tablet Take 1 tablet (81 mg total) by mouth daily. Swallow whole.   baclofen (LIORESAL) 10 MG tablet TAKE 1 TABLET BY MOUTH 1 TO 2  TIMES DAILY AS NEEDED FOR PAIN  AND MUSCLE TIGHTNESS   ezetimibe (ZETIA) 10 MG tablet Take 1 tablet (10 mg total) by mouth daily.   losartan (COZAAR) 100 MG tablet TAKE 1 TABLET BY MOUTH DAILY    mometasone-formoterol (DULERA) 200-5 MCG/ACT AERO USE 2 INHALATIONS BY MOUTH  TWICE DAILY   rosuvastatin (CRESTOR) 10 MG tablet Take 1 tablet (10 mg total) by mouth daily.   tiotropium (SPIRIVA HANDIHALER) 18 MCG inhalation capsule INHALE THE CONTENTS OF 1 CAPSULE BY MOUTH VIA INHALATION DEVICE  DAILY   albuterol (PROVENTIL) (2.5 MG/3ML) 0.083% nebulizer solution USE 1 VIAL VIA NEBULIZER EVERY 6 HOURS AS NEEDED FOR WHEEZING OR  SHORTNESS OF BREATH (Patient not taking: Reported on 07/23/2022)   albuterol (VENTOLIN HFA) 108 (90 Base) MCG/ACT inhaler USE 1 TO 2 INHALATIONS BY MOUTH  EVERY 6 HOURS AS NEEDED FOR  WHEEZING OR SHORTNESS OF BREATH (Patient not taking: Reported on 06/22/2022)   blood glucose meter kit and supplies KIT Accu-Chek Aviva Plus Use up to TID daily as directed. (FOR ICD-9 250.00, 250.01). (Patient not taking: Reported on 08/17/2022)   Blood Glucose Monitoring Suppl DEVI 1 each by Does not apply route daily. May substitute to any manufacturer covered by patient's insurance. (Patient not taking: Reported on 08/17/2022)   famotidine (PEPCID) 10 MG tablet Take 10 mg by mouth 2 (two) times daily. (Patient not taking: Reported on 08/17/2022)   Glucose Blood (BLOOD GLUCOSE TEST STRIPS) STRP 1 each by In Vitro route daily. May substitute to any manufacturer covered by patient's insurance. (Patient not taking: Reported on 08/17/2022)   ibuprofen (ADVIL) 400 MG tablet Take 1  tablet (400 mg total) by mouth every 8 (eight) hours as needed for moderate pain. (Patient not taking: Reported on 08/17/2022)   montelukast (SINGULAIR) 10 MG tablet TAKE 1 TABLET BY MOUTH AT  BEDTIME (Patient not taking: Reported on 08/17/2022)   No facility-administered encounter medications on file as of 08/25/2022.    Allergies (verified) Mushroom extract complex, Pravastatin sodium, and Atorvastatin   History: Past Medical History:  Diagnosis Date   Blurry vision, bilateral 03/20/2014   Callus of foot 07/16/2014    COPD (chronic obstructive pulmonary disease) (HCC)    COVID-19 virus infection    Dysphagia, pharyngoesophageal phase    Epidermal cyst of face 07/16/2014   Hypercholesterolemia    Hypertension    TIA (transient ischemic attack)    Once   Tobacco abuse    Unintended weight loss 02/06/2019   Past Surgical History:  Procedure Laterality Date   BTL     CHOLECYSTECTOMY     COLONOSCOPY N/A 06/10/2014   Procedure: COLONOSCOPY;  Surgeon: Corbin Ade, MD;  Location: [facility];  Service: Endoscopy;  Laterality: N/A;  1315   ECTOPIC PREGNANCY SURGERY     ESOPHAGOGASTRODUODENOSCOPY N/A 06/10/2014   Procedure: ESOPHAGOGASTRODUODENOSCOPY (EGD);  Surgeon: Corbin Ade, MD;  Location: [facility];  Service: Endoscopy;  Laterality: N/A;   MALONEY DILATION N/A 06/10/2014   Procedure: Elease Hashimoto DILATION;  Surgeon: Corbin Ade, MD;  Location: [facility];  Service: Endoscopy;  Laterality: N/A;   TUBAL LIGATION     Family History  Problem Relation Age of Onset   Aneurysm Mother        brain, cause of death   Cancer Brother        Dies of cancer, unsure what primary site   Prostate cancer Brother    Hypertension Daughter    Diabetes Daughter    Colon cancer Neg Hx    Breast cancer Neg Hx    Social History   Socioeconomic History   Marital status: Divorced    Spouse name: Not on file   Number of children: 2   Years of education: 12   Highest education level: 12th grade  Occupational History   Not on file  Tobacco Use   Smoking status: Every Day    Current packs/day: 1.00    Average packs/day: 1 pack/day for 52.6 years (52.6 ttl pk-yrs)    Types: Cigarettes    Start date: 01/04/1970   Smokeless tobacco: Never   Tobacco comments:    Cut down from 1 PPD to 0.5 PPD  Vaping Use   Vaping status: Never Used  Substance and Sexual Activity   Alcohol use: Yes    Alcohol/week: 0.0 standard drinks of alcohol    Comment: rarely    Drug use: Yes    Types: Marijuana    Comment:  Occasionally   Sexual activity: Not Currently    Birth control/protection: Surgical  Other Topics Concern   Not on file  Social History Narrative   Patient lives with her brother and two dogs.    Patient is divorced and has 2 children.    Patient enjoys gardening and playing with her dogs.    Social Determinants of Health   Financial Resource Strain: Low Risk  (08/25/2022)   Overall Financial Resource Strain (CARDIA)    Difficulty of Paying Living Expenses: Not hard at all  Food Insecurity: No Food Insecurity (08/25/2022)   Hunger Vital Sign    Worried About Running Out of  Food in the Last Year: Never true    Ran Out of Food in the Last Year: Never true  Transportation Needs: No Transportation Needs (08/25/2022)   PRAPARE - Administrator, Civil Service (Medical): No    Lack of Transportation (Non-Medical): No  Physical Activity: Insufficiently Active (08/25/2022)   Exercise Vital Sign    Days of Exercise per Week: 3 days    Minutes of Exercise per Session: 30 min  Stress: No Stress Concern Present (08/25/2022)   [employer] of Occupational Health - Occupational Stress Questionnaire    Feeling of Stress : Not at all  Social Connections: Socially Isolated (08/25/2022)   Social Connection and Isolation Panel [NHANES]    Frequency of Communication with Friends and Family: More than three times a week    Frequency of Social Gatherings with Friends and Family: Once a week    Attends Religious Services: Never    Database administrator or Organizations: No    Attends Engineer, structural: Never    Marital Status: Divorced    Tobacco Counseling Ready to quit: Not Answered Counseling given: Not Answered Tobacco comments: Cut down from 1 PPD to 0.5 PPD   Clinical Intake:  Pre-visit preparation completed: Yes  Pain : No/denies pain     Diabetes: Yes CBG done?: No Did pt. bring in CBG monitor from home?: No  How often do you need to have someone help  you when you read instructions, pamphlets, or other written materials from your doctor or pharmacy?: 1 - Never  Interpreter Needed?: No  Information entered by :: Kandis Fantasia LPN   Activities of Daily Living    08/25/2022   11:40 AM 11/17/2021   11:32 AM  In your present state of health, do you have any difficulty performing the following activities:  Hearing? 0 0  Vision? 0 1  Comment  She wears glasses for driving > farsightedness  Difficulty concentrating or making decisions? 0 0  Walking or climbing stairs? 0 1  Comment  Sometimes  Dressing or bathing? 0 0  Doing errands, shopping? 0 0  Preparing Food and eating ? N   Using the Toilet? N   In the past six months, have you accidently leaked urine? Y   Do you have problems with loss of bowel control? N   Managing your Medications? N   Managing your Finances? N   Housekeeping or managing your Housekeeping? N     Patient Care Team: Doreene Eland, MD as PCP - General (Family Medicine) Jena Gauss Gerrit Friends, MD as Consulting Physician (Gastroenterology)  Indicate any recent Medical Services you may have received from other than [facility] providers in the past year (date may be approximate).     Assessment:   This is a routine wellness examination for [facility].  Hearing/Vision screen Hearing Screening - Comments:: Denies hearing difficulties   Vision Screening - Comments:: Wears rx glasses - up to date with routine eye exams with Dr. London Sheer    Dietary issues and exercise activities discussed:     Goals Addressed             This Visit's Progress    Remain active and independent        Depression Screen    08/25/2022   11:32 AM 08/17/2022    8:16 AM 07/23/2022    8:37 AM 06/22/2022    8:25 AM 02/16/2022    8:21 AM 01/26/2022    8:26 AM  01/07/2022    9:55 AM  PHQ 2/9 Scores  PHQ - 2 Score 0 0  0 0 0 3  PHQ- 9 Score 2 2  4 4 3 12   Exception Documentation   Patient refusal        Fall Risk    08/25/2022    11:34 AM 01/07/2022    9:55 AM 11/17/2021   11:31 AM 09/30/2020   11:09 AM 03/23/2019    8:35 AM  Fall Risk   Falls in the past year? 0 0 1 0 0  Number falls in past yr: 0 0 0    Injury with Fall? 0 0 0    Risk for fall due to : No Fall Risks  No Fall Risks No Fall Risks   Follow up Falls prevention discussed;Education provided;Falls evaluation completed   Falls prevention discussed     MEDICARE RISK AT HOME: Medicare Risk at Home Any stairs in or around the home?: No If so, are there any without handrails?: No Home free of loose throw rugs in walkways, pet beds, electrical cords, etc?: Yes Adequate lighting in your home to reduce risk of falls?: Yes Life alert?: No Use of a cane, walker or w/c?: No Grab bars in the bathroom?: Yes Shower chair or bench in shower?: No Elevated toilet seat or a handicapped toilet?: Yes  TIMED UP AND GO:  Was the test performed?  No    Cognitive Function:        08/25/2022   11:41 AM 09/30/2020   11:11 AM  6CIT Screen  What Year? 0 points 0 points  What month? 0 points 0 points  What time? 0 points 0 points  Count back from 20 0 points 0 points  Months in reverse 0 points 0 points  Repeat phrase 0 points 0 points  Total Score 0 points 0 points    Immunizations Immunization History  Administered Date(s) Administered   Influenza,inj,Quad PF,6+ Mos 11/10/2017, 09/01/2018   Pneumococcal Polysaccharide-23 11/10/2017   Td 01/04/2001   Tdap 07/04/2018    TDAP status: Up to date  Flu Vaccine status: Declined, Education has been provided regarding the importance of this vaccine but patient still declined. Advised may receive this vaccine at local pharmacy or Health Dept. Aware to provide a copy of the vaccination record if obtained from local pharmacy or Health Dept. Verbalized acceptance and understanding.  Pneumococcal vaccine status: Up to date  Covid-19 vaccine status: Information provided on how to obtain vaccines.   Qualifies for  Shingles Vaccine? Yes   Zostavax completed No   Shingrix Completed?: No.    Education has been provided regarding the importance of this vaccine. Patient has been advised to call insurance company to determine out of pocket expense if they have not yet received this vaccine. Advised may also receive vaccine at local pharmacy or Health Dept. Verbalized acceptance and understanding.  Screening Tests Health Maintenance  Topic Date Due   Lung Cancer Screening  08/19/2022   COVID-19 Vaccine (1 - 2023-24 season) 07/05/2023 (Originally 09/04/2021)   INFLUENZA VACCINE  09/05/2023 (Originally 08/05/2022)   HEMOGLOBIN A1C  09/22/2022   Diabetic kidney evaluation - Urine ACR  10/07/2022   Diabetic kidney evaluation - eGFR measurement  01/08/2023   OPHTHALMOLOGY EXAM  03/03/2023   FOOT EXAM  07/23/2023   Medicare Annual Wellness (AWV)  08/25/2023   PAP SMEAR-Modifier  04/23/2024   Colonoscopy  06/09/2024   MAMMOGRAM  07/13/2024   DTaP/Tdap/Td (3 - Td or  Tdap) 07/03/2028   Hepatitis C Screening  Completed   HIV Screening  Completed   HPV VACCINES  Aged Out   Zoster Vaccines- Shingrix  Discontinued    Health Maintenance  Health Maintenance Due  Topic Date Due   Lung Cancer Screening  08/19/2022    Colorectal cancer screening: Type of screening: Colonoscopy. Completed 06/10/14. Repeat every 10 years  Mammogram status: Completed 07/14/22. Repeat every year  Lung Cancer Screening: (Low Dose CT Chest recommended if Age 26-80 years, 20 pack-year currently smoking OR have quit w/in 15years.) does qualify.   Lung Cancer Screening Referral: completed 08/24/22  Additional Screening:  Hepatitis C Screening: does qualify; Completed 08/06/16  Vision Screening: Recommended annual ophthalmology exams for early detection of glaucoma and other disorders of the eye. Is the patient up to date with their annual eye exam?  Yes  Who is the provider or what is the name of the office in which the patient attends  annual eye exams? Dr. London Sheer If pt is not established with a provider, would they like to be referred to a provider to establish care? No .   Dental Screening: Recommended annual dental exams for proper oral hygiene  Diabetic Foot Exam: Diabetic Foot Exam: Completed 07/23/22  Community Resource Referral / Chronic Care Management: CRR required this visit?  No   CCM required this visit?  No     Plan:     I have personally reviewed and noted the following in the patient's chart:   Medical and social history Use of alcohol, tobacco or illicit drugs  Current medications and supplements including opioid prescriptions. Patient is not currently taking opioid prescriptions. Functional ability and status Nutritional status Physical activity Advanced directives List of other physicians Hospitalizations, surgeries, and ER visits in previous 12 months Vitals Screenings to include cognitive, depression, and falls Referrals and appointments  In addition, I have reviewed and discussed with patient certain preventive protocols, quality metrics, and best practice recommendations. A written personalized care plan for preventive services as well as general preventive health recommendations were provided to patient.     Kandis Fantasia [place], [place]   04/12/8117   After Visit Summary: (MyChart) Due to this being a telephonic visit, the after visit summary with patients personalized plan was offered to patient via MyChart   Nurse Notes: No concerns at this time

## 2022-08-31 ENCOUNTER — Telehealth: Payer: Self-pay | Admitting: Family Medicine

## 2022-08-31 NOTE — Telephone Encounter (Signed)
Patient returns call to nurse line.   Results discussed with patient.   All questions answered.

## 2022-08-31 NOTE — Telephone Encounter (Signed)
HIPAA compliant callback message left.  Please advise when she calls  Lung CT scan is negative for cancer - plan to repeat in 1 year CT scan still indicates that you have pulmonary hypertension, although this was negative on your last ECHO. You already saw the pulmonologist for this. We'll monitor for now while controlling your BP and diabetes You have left kidney stone which we can monitor for now CT lungs shows that you have thickened cardiac arteries. This can be as we get older or in patient with diabetes and HTN. We would continue your cholesterol medications and ASA for now.  Let me know if you have any questions.  Dr. Lum Babe

## 2022-09-19 IMAGING — MG MM DIGITAL SCREENING BILAT W/ TOMO AND CAD
6 of 10 series · 6 of 30 positions shown · non-contrast
Comparison: Previous exam(s).

CLINICAL DATA: Screening.

EXAM:
DIGITAL SCREENING BILATERAL MAMMOGRAM WITH TOMOSYNTHESIS AND CAD
TECHNIQUE: Bilateral screening digital craniocaudal and mediolateral oblique
mammograms were obtained. Bilateral screening digital breast
tomosynthesis was performed. The images were evaluated with
computer-aided detection.

[L MLO synth-2D (1 of 2)]
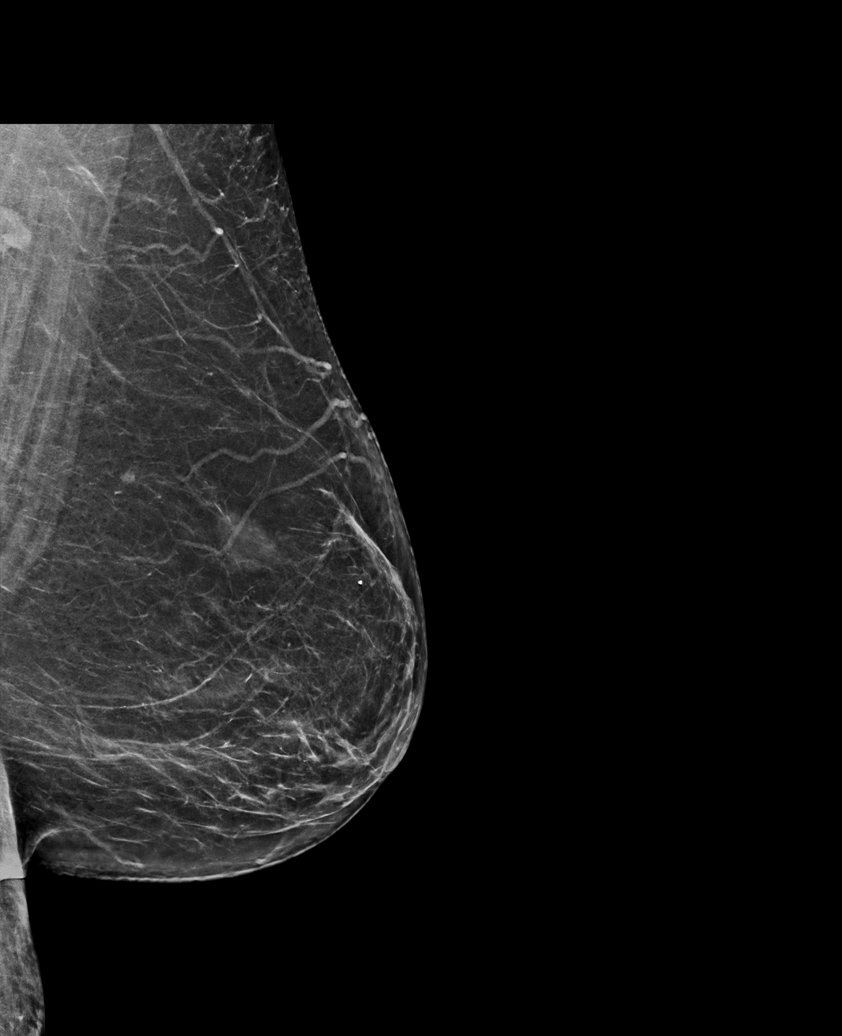

[L CC synth-2D]
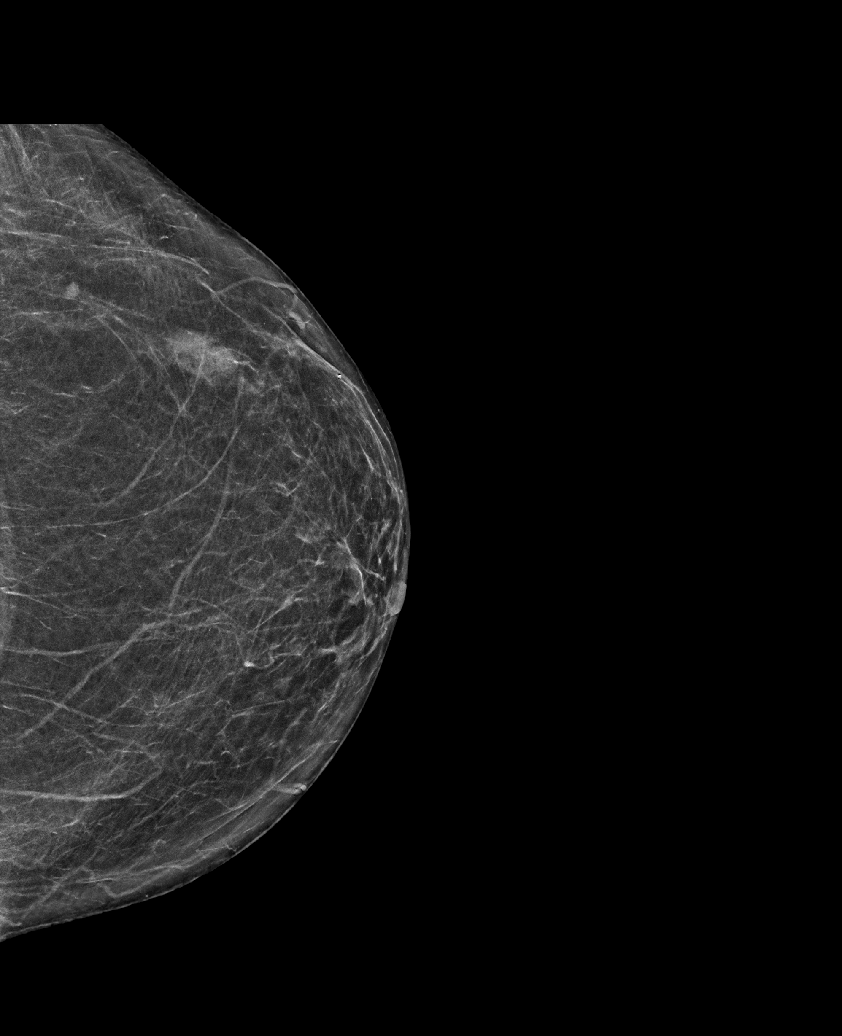

[L MLO synth-2D (2 of 2)]
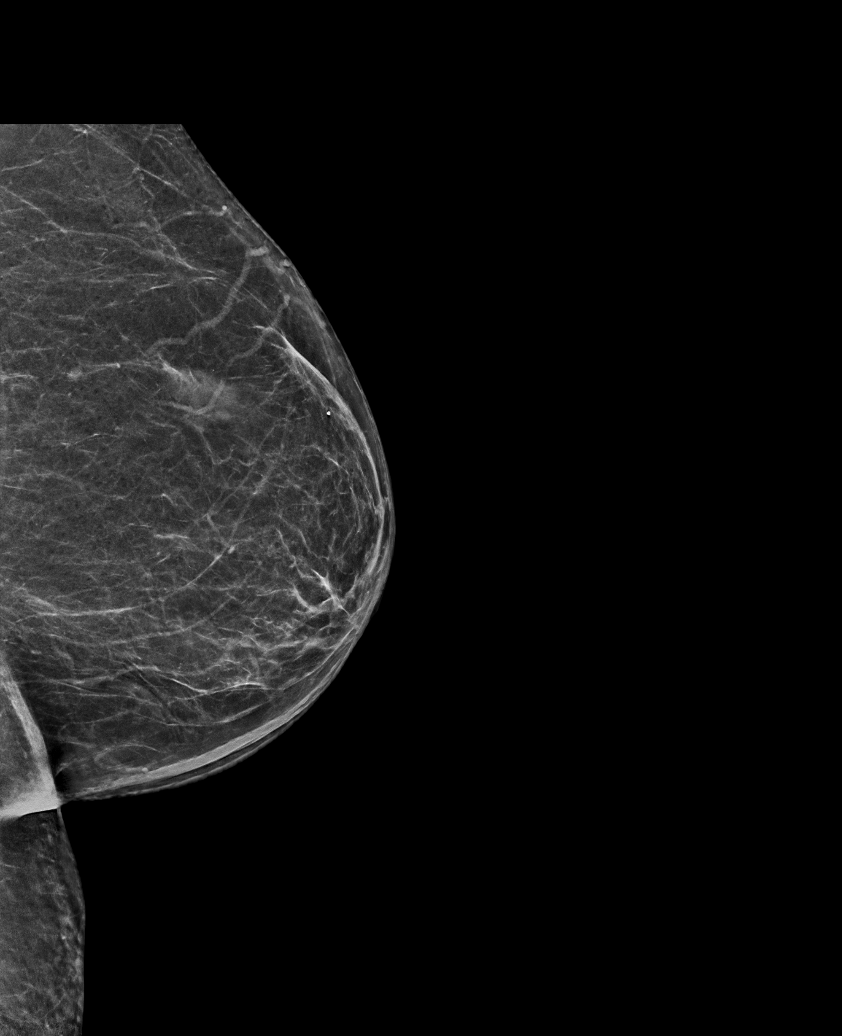

[R MLO synth-2D]
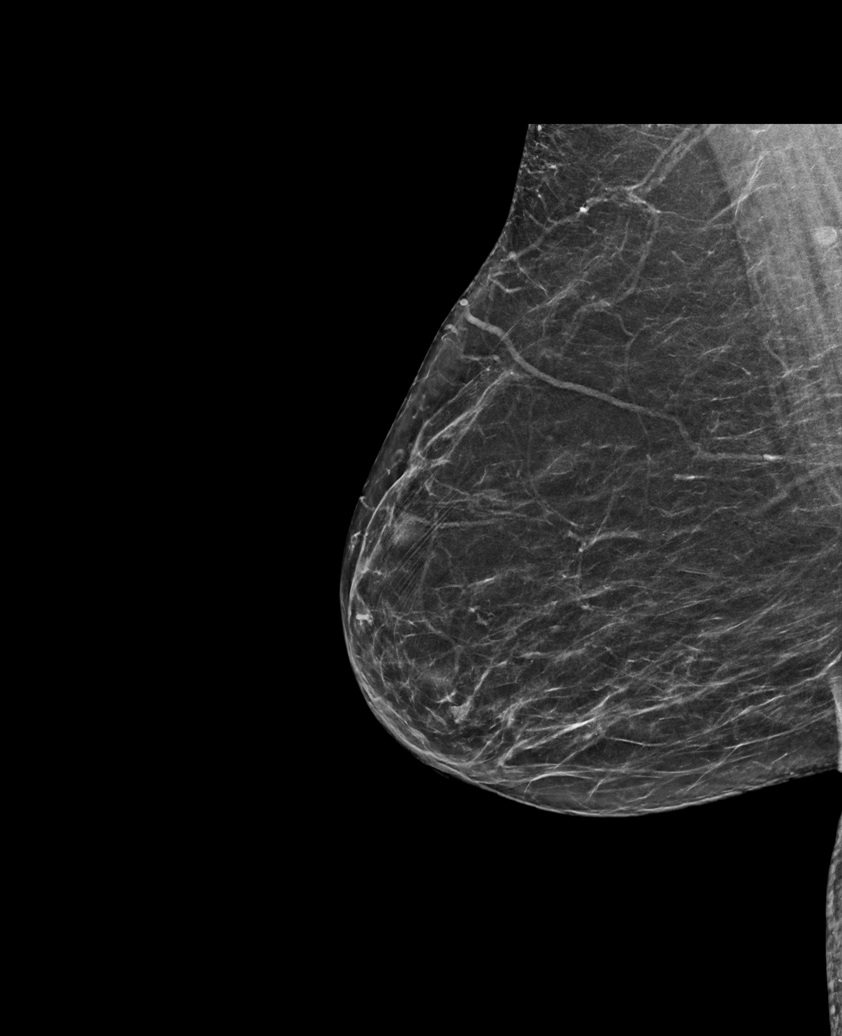

[R CC synth-2D]
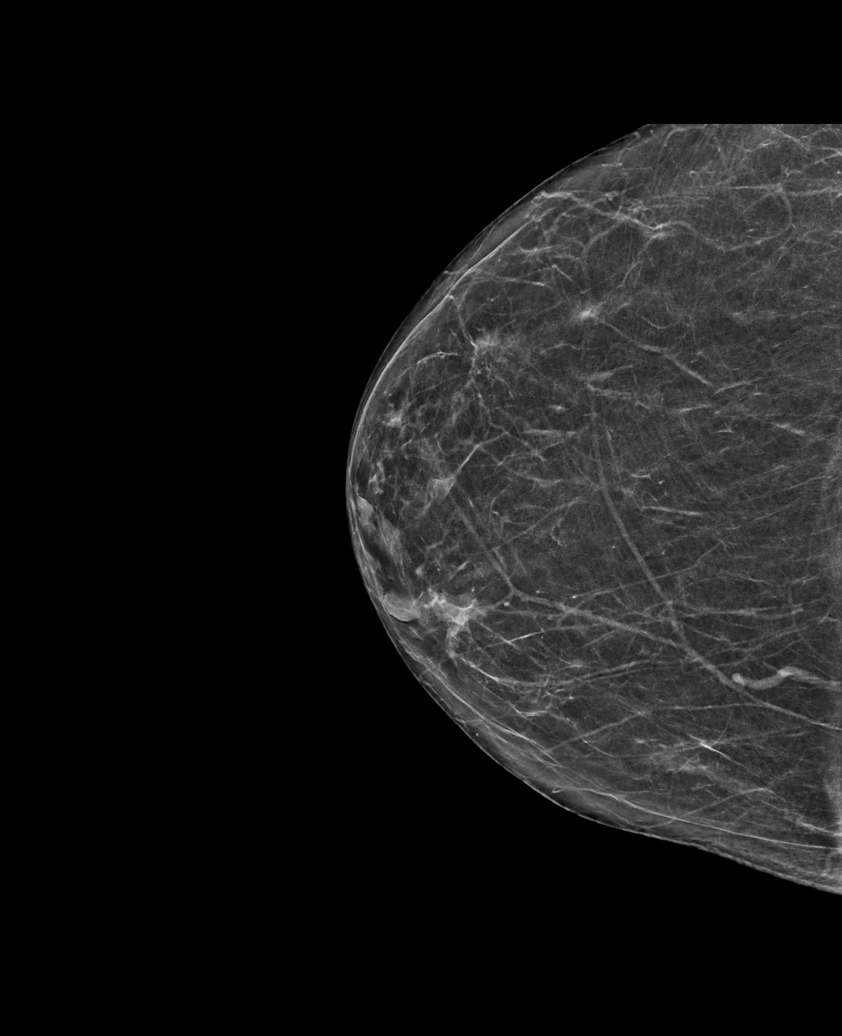

[L MLO tomo · tomo slice 33/65.0]
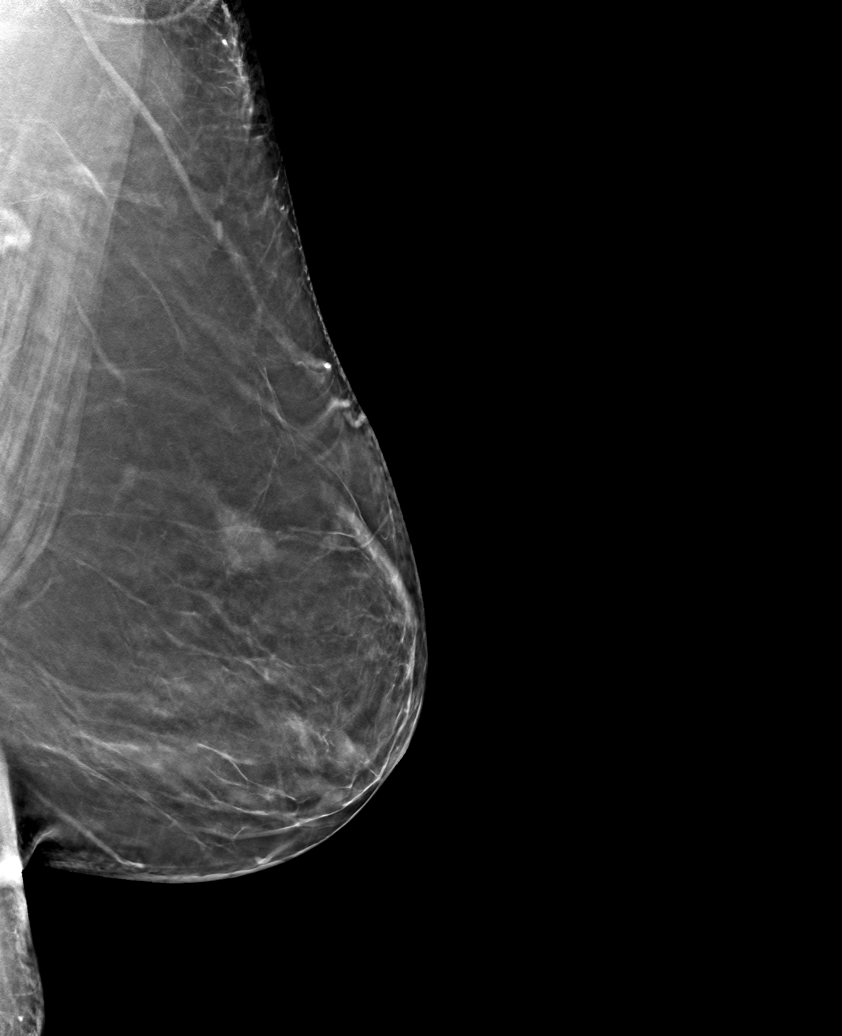

[6 of 30 positions shown; findings below may reference images not displayed]

ACR Breast Density Category b: There are scattered areas of
fibroglandular density.
FINDINGS: There are no findings suspicious for malignancy.
IMPRESSION: No mammographic evidence of malignancy. A result letter of this
screening mammogram will be mailed directly to the patient.

RECOMMENDATION:
Screening mammogram in one year. (Code:51-O-LD2)

BI-RADS CATEGORY  1: Negative.

## 2022-09-27 ENCOUNTER — Encounter: Payer: Self-pay | Admitting: Family Medicine

## 2022-09-27 DIAGNOSIS — I700 Atherosclerosis of aorta: Secondary | ICD-10-CM | POA: Insufficient documentation

## 2022-09-28 ENCOUNTER — Encounter: Payer: Self-pay | Admitting: Family Medicine

## 2022-09-28 ENCOUNTER — Ambulatory Visit: Payer: 59 | Admitting: Family Medicine

## 2022-09-28 VITALS — BP 143/74 | HR 75 | Ht <= 58 in | Wt 149.6 lb

## 2022-09-28 DIAGNOSIS — E1169 Type 2 diabetes mellitus with other specified complication: Secondary | ICD-10-CM | POA: Diagnosis not present

## 2022-09-28 DIAGNOSIS — J449 Chronic obstructive pulmonary disease, unspecified: Secondary | ICD-10-CM

## 2022-09-28 DIAGNOSIS — E785 Hyperlipidemia, unspecified: Secondary | ICD-10-CM

## 2022-09-28 DIAGNOSIS — M79642 Pain in left hand: Secondary | ICD-10-CM

## 2022-09-28 DIAGNOSIS — M79641 Pain in right hand: Secondary | ICD-10-CM

## 2022-09-28 DIAGNOSIS — R42 Dizziness and giddiness: Secondary | ICD-10-CM | POA: Diagnosis not present

## 2022-09-28 DIAGNOSIS — I5030 Unspecified diastolic (congestive) heart failure: Secondary | ICD-10-CM

## 2022-09-28 DIAGNOSIS — Z6832 Body mass index (BMI) 32.0-32.9, adult: Secondary | ICD-10-CM | POA: Diagnosis not present

## 2022-09-28 DIAGNOSIS — Z7984 Long term (current) use of oral hypoglycemic drugs: Secondary | ICD-10-CM

## 2022-09-28 LAB — POCT GLYCOSYLATED HEMOGLOBIN (HGB A1C): HbA1c, POC (controlled diabetic range): 7.1 % — AB (ref 0.0–7.0)

## 2022-09-28 MED ORDER — SEMAGLUTIDE(0.25 OR 0.5MG/DOS) 2 MG/1.5ML ~~LOC~~ SOPN: 0.2500 mg | PEN_INJECTOR | SUBCUTANEOUS | 1 refills | Status: DC

## 2022-09-28 NOTE — Assessment & Plan Note (Signed)
A1C worsened to 7.1 Diet and exercise discussed I discussed initiating Ozempic  She denies hx of or Fhx or medullary thyroid cancer or any thyroid cancer Start Ozempic 0.25 mg weekly F/U in 4 weeks for reassessment - she can see me or Dr. Raymondo Band She agreed with the plan Urine microalbumin checked today

## 2022-09-28 NOTE — Progress Notes (Signed)
    SUBJECTIVE:   CHIEF COMPLAINT / HPI:   DM2:  She is here for the follow-up and denies any concerns. However, she stated that her diet had been poor. She eats more potatoes, pasta, and rice and does not exercise as before.  Weight management: Reduced exercise in the last few months compared to prior. She had not done well with her diet she says.  Hand Pain:  She still has issues with finger-dropping. No major pain. She feels things are stable now with no acute exacerbation. Symptoms worsens only with overuse.   Dizziness:  The patient stated that on August 26th, when she tried to get up from her bed, her head started spinning, and she had to fall back and lie down for a minute. No numbness or extremity weakness. She has experienced similar symptoms daily since then, although milder. She denies fall or LOC.  COPD/CHF: Coughing more this morning due to rain. She is otherwise ding well and working on cutting back on her smoking. No SOB or chest pain.  HM: Need COVID-19 and Flu shot  PERTINENT  PMH / PSH: PMHx reviewed  OBJECTIVE:   BP (!) 143/74   Pulse 75   Ht 4\' 9"  (1.448 m)   Wt 149 lb 9.6 oz (67.9 kg)   LMP 12/27/2011   SpO2 100%   BMI 32.37 kg/m   Physical Exam Vitals and nursing note reviewed.  Constitutional:      Appearance: Normal appearance.  Eyes:     Extraocular Movements: Extraocular movements intact.     Pupils: Pupils are equal, round, and reactive to light.  Cardiovascular:     Rate and Rhythm: Normal rate and regular rhythm.     Heart sounds: Normal heart sounds. No murmur heard. Pulmonary:     Effort: Pulmonary effort is normal. No respiratory distress.     Breath sounds: Normal breath sounds. No wheezing.  Neurological:     General: No focal deficit present.     Mental Status: She is alert.     Cranial Nerves: Cranial nerves 2-12 are intact.     Sensory: Sensation is intact.     Motor: Motor function is intact.     Coordination: Coordination is  intact.     Comments: Epley's maneuver completed Felt more dizzy on the left with no Nystagmus      ASSESSMENT/PLAN:   Diabetes mellitus A1C worsened to 7.1 Diet and exercise discussed I discussed initiating Ozempic  She denies hx of or Fhx or medullary thyroid cancer or any thyroid cancer Start Ozempic 0.25 mg weekly F/U in 4 weeks for reassessment - she can see me or Dr. Raymondo Chapman She agreed with the plan Urine microalbumin checked today  BMI 32.0-32.9,adult Diet and exercise discussed during this visit She'll continue to work on her diet Initiating Ozempic today Monitor closely for improvement  Hand pain Stable  Vertigo She tolerated Epley's maneuver She will continue to do this daily at home till her symptoms resolved Consider Meclizine in the future She will have her brother help her with the maneuver at home  COPD (chronic obstructive pulmonary disease) (HCC) Continue current inhaler Work on smoking cessation  Diastolic CHF (HCC) Stable    Health maintenance: She declined both COVID and Flu shots when offered today. She does not take these vaccinations.  Betty Pagan, MD [facility]

## 2022-09-28 NOTE — Assessment & Plan Note (Signed)
She tolerated Epley's maneuver She will continue to do this daily at home till her symptoms resolved Consider Meclizine in the future She will have her brother help her with the maneuver at home

## 2022-09-28 NOTE — Assessment & Plan Note (Signed)
Stable

## 2022-09-28 NOTE — Assessment & Plan Note (Signed)
Continue current inhaler Work on smoking cessation

## 2022-09-28 NOTE — Patient Instructions (Signed)
How to Perform the Epley Maneuver The Epley maneuver is an exercise that relieves symptoms of vertigo. Vertigo is the feeling that you or your surroundings are moving when they are not. When you feel vertigo, you may feel like the room is spinning and may have trouble walking. The Epley maneuver is used for a type of vertigo caused by a calcium deposit in a part of the inner ear. The maneuver involves changing head positions to help the deposit move out of the area. You can do this maneuver at home whenever you have symptoms of vertigo. You can repeat it in 24 hours if your vertigo has not gone away. Even though the Epley maneuver may relieve your vertigo for a few weeks, it is possible that your symptoms will return. This maneuver relieves vertigo, but it does not relieve dizziness. What are the risks? If it is done correctly, the Epley maneuver is considered safe. Sometimes it can lead to dizziness or nausea that goes away after a short time. If you develop other symptoms--such as changes in vision, weakness, or numbness--stop doing the maneuver and call your health care provider. Supplies needed: A bed or table. A pillow. How to do the Epley maneuver     Sit on the edge of a bed or table with your back straight and your legs extended or hanging over the edge of the bed or table. Turn your head halfway toward the affected ear or side as told by your health care provider. Lie backward quickly with your head turned until you are lying flat on your back. Your head should dangle (head-hanging position). You may want to position a pillow under your shoulders. Hold this position for at least 30 seconds. If you feel dizzy or have symptoms of vertigo, continue to hold the position until the symptoms stop. Turn your head to the opposite direction until your unaffected ear is facing down. Your head should continue to dangle. Hold this position for at least 30 seconds. If you feel dizzy or have symptoms of  vertigo, continue to hold the position until the symptoms stop. Turn your whole body to the same side as your head so that you are positioned on your side. Your head will now be nearly facedown and no longer needs to dangle. Hold for at least 30 seconds. If you feel dizzy or have symptoms of vertigo, continue to hold the position until the symptoms stop. Sit back up. You can repeat the maneuver in 24 hours if your vertigo does not go away. Follow these instructions at home: For 24 hours after doing the Epley maneuver: Keep your head in an upright position. When lying down to sleep or rest, keep your head raised (elevated) with two or more pillows. Avoid excessive neck movements. Activity Do not drive or use machinery if you feel dizzy. After doing the Epley maneuver, return to your normal activities as told by your health care provider. Ask your health care provider what activities are safe for you. General instructions Drink enough fluid to keep your urine pale yellow. Do not drink alcohol. Take over-the-counter and prescription medicines only as told by your health care provider. Keep all follow-up visits. This is important. Preventing vertigo symptoms Ask your health care provider if there is anything you should do at home to prevent vertigo. He or she may recommend that you: Keep your head elevated with two or more pillows while you sleep. Do not sleep on the side of your affected ear. Get  up slowly from bed. Avoid sudden movements during the day. Avoid extreme head positions or movement, such as looking up or bending over. Contact a health care provider if: Your vertigo gets worse. You have other symptoms, including: Nausea. Vomiting. Headache. Get help right away if you: Have vision changes. Have a headache or neck pain that is severe or getting worse. Cannot stop vomiting. Have new numbness or weakness in any part of your body. These symptoms may represent a serious problem  that is an emergency. Do not wait to see if the symptoms will go away. Get medical help right away. Call your local emergency services (911 in the [place] Do not drive yourself to the hospital. Summary Vertigo is the feeling that you or your surroundings are moving when they are not. The Epley maneuver is an exercise that relieves symptoms of vertigo. If the Epley maneuver is done correctly, it is considered safe. This information is not intended to replace advice given to you by your health care provider. Make sure you discuss any questions you have with your health care provider. Document Revised: 11/21/2019 Document Reviewed: 11/21/2019 Elsevier Patient Education  2024 [employer].

## 2022-09-28 NOTE — Assessment & Plan Note (Signed)
Diet and exercise discussed during this visit She'll continue to work on her diet Initiating Ozempic today Monitor closely for improvement

## 2022-09-28 NOTE — Assessment & Plan Note (Addendum)
A1C worsened to 7.1 Diet and exercise discussed I discussed initiating Ozempic  She denies hx of or Fhx or medullary thyroid cancer or any thyroid cancer Start Ozempic 0.25 mg weekly F/U in 4 weeks for reassessment - she can see me or Dr. Raymondo Band She agreed with the plan Urine microalbumin checked today

## 2022-09-29 ENCOUNTER — Other Ambulatory Visit (HOSPITAL_COMMUNITY): Payer: Self-pay

## 2022-09-29 LAB — MICROALBUMIN / CREATININE URINE RATIO
Creatinine, Urine: 23.6 mg/dL
Microalb/Creat Ratio: <13 mg/g creat (ref 0–29)
Microalbumin, Urine: <3 ug/mL

## 2022-09-30 ENCOUNTER — Telehealth: Payer: Self-pay | Admitting: Family Medicine

## 2022-09-30 MED ORDER — SEMAGLUTIDE(0.25 OR 0.5MG/DOS) 2 MG/1.5ML ~~LOC~~ SOPN: 0.2500 mg | PEN_INJECTOR | SUBCUTANEOUS | 1 refills | Status: DC

## 2022-09-30 NOTE — Progress Notes (Signed)
Test results discussed. Repeat urine uACR in one year.

## 2022-09-30 NOTE — Telephone Encounter (Signed)
I received a message from [facility] that[facility] don't have Ozempic in stock. Patient notified and agreed that I e-scribe [facility] meds to [facility]. Med sent. I called [facility] to d/c Ozempic prescription. Pharmacy was [facility]

## 2022-10-10 ENCOUNTER — Other Ambulatory Visit: Payer: Self-pay | Admitting: Family Medicine

## 2022-10-21 ENCOUNTER — Other Ambulatory Visit: Payer: Self-pay | Admitting: Family Medicine

## 2022-10-27 ENCOUNTER — Ambulatory Visit: Payer: 59 | Admitting: Pharmacist

## 2022-10-27 ENCOUNTER — Encounter: Payer: Self-pay | Admitting: Pharmacist

## 2022-10-27 VITALS — BP 143/61 | HR 74 | Wt 148.4 lb

## 2022-10-27 DIAGNOSIS — Z7985 Long-term (current) use of injectable non-insulin antidiabetic drugs: Secondary | ICD-10-CM

## 2022-10-27 DIAGNOSIS — I10 Essential (primary) hypertension: Secondary | ICD-10-CM | POA: Diagnosis not present

## 2022-10-27 DIAGNOSIS — E1169 Type 2 diabetes mellitus with other specified complication: Secondary | ICD-10-CM

## 2022-10-27 DIAGNOSIS — F17200 Nicotine dependence, unspecified, uncomplicated: Secondary | ICD-10-CM | POA: Diagnosis not present

## 2022-10-27 MED ORDER — PEN NEEDLES 31G X 5 MM MISC: 1.0000 | 11 refills | Status: AC | PRN

## 2022-10-27 MED ORDER — SEMAGLUTIDE(0.25 OR 0.5MG/DOS) 2 MG/1.5ML ~~LOC~~ SOPN: 0.2500 mg | PEN_INJECTOR | SUBCUTANEOUS | 1 refills | Status: DC

## 2022-10-27 NOTE — Assessment & Plan Note (Signed)
Hypertension longstanding currently uncontrolled. Blood pressure goal of <130/80 mmHg. Medication adherence good. Blood pressure control is suboptimal due to elevated systolic BP with low diastolic BP. Concern with lowering diastolic BP with addition of another agent.  -Continued losartan 100 mg once daily.

## 2022-10-27 NOTE — Assessment & Plan Note (Signed)
Diabetes longstanding currently nearly controlled. Patient is able to verbalize appropriate hypoglycemia management plan. Medication adherence appears good. Control is suboptimal, due to side effects of medication. - Continue semaglutide (Ozempic) 0.25 mg once weekly - Started Miralax (OTC) one capful every other day as needed for constipation. Discussed that she could increase frequency to achieve desired bowel movements.  -Patient educated on purpose, proper use, and potential adverse effects of.  -Extensively discussed pathophysiology of diabetes, recommended lifestyle interventions, dietary effects on blood sugar control.  -Counseled on s/sx of and management of hypoglycemia.  -Next A1c anticipated 01/11/2023 at PCP visit.  - Sent refills of semaglutide (Ozempic) and pen needles to preferred pharmacy per Pt request

## 2022-10-27 NOTE — Assessment & Plan Note (Signed)
Tobacco use disorder with moderate nicotine dependence of many years duration in a patient who is fair candidate for success because of her progress to date.  Reluctant to use any medication to assist with intake reduction, but has reduced to 1.5 cartons/month from 3 cartons/month.  Short Term Goal: reducing tobacco intake to 7 cigarettes/day by the end of the year

## 2022-10-27 NOTE — Progress Notes (Signed)
S:   Chief Complaint  Patient presents with   Medication Management    T2DM & Tobacco Use Disorder F/U   62 y.o. female who presents for evaluation/assistance with tobacco dependence.  PMH is significant for T2DM, HTN, HLD, Tobacco Use Disorder.   Patient was last seen by Primary Care Provider, Dr. Lum Babe, on 08/17/2022.   At last visit, started semaglutide (Ozempic) 0.25 mg once weekly with an A1c of 7.1%, which Pt was not happy with. Feels lightheaded/dizziness, "spinning", occasionally particularly when changing positions in bed and getting up, reports Dr. Lum Babe told her she had vertigo.   Reports not liking her semaglutide (Ozempic) because it made her sick with nausea/vomiting, that has since stopped, and changes in bowel movements with decreased movements that she must strain for. Has not tried taking any stool softener products. Reporting higher energy levels, particularly in the evening.   Pt reports tobacco intake reduced from 3 cartons/month to 1.5 cartons/month. Reports breathing is fine, but no changes to breathing/cough since cutting back. Not interested in cessation, but willing to continue to try to cut back.   Patient reports Diabetes was diagnosed in 2016.   Current diabetes medications include: semaglutide (Ozempic) 0.25 mg once weekly Current hypertension medications include: losartan 100 mg once daily Current hyperlipidemia medications include: rosuvastatin (Crestor) 10 mg once daily, ezetimibe 10 mg once daily     Patient reports adherence to taking all medications as prescribed, except aspirin 81 mg, which she takes 2-3 days weekly.  Have you been experiencing any side effects to the medications prescribed? Yes - Constipation with semaglutide (Ozempic) Insurance coverage: [employer] [place] [employer]  Pt denies checking blood sugar at home, wants a CGM but not on insulin, so insurance will not cover.  Patient reports nocturia (nighttime  urination). ~ 1 time nightly.   Patient reported dietary habits: Eats lots of fiber, and generally well rounded meals. - No dietary changes, but reports that she feels full with smaller portion sizes.  Drinks: coffee with non-dairy creamer and 2 teaspoons of sugar  O:   Lab Results  Component Value Date   HGBA1C 7.1 (A) 09/28/2022    Lipid Panel     Component Value Date/Time   CHOL 192 02/16/2022 1200   TRIG 121 02/16/2022 1200   HDL 64 02/16/2022 1200   CHOLHDL 3.0 02/16/2022 1200   CHOLHDL 4.8 04/08/2014 0850   VLDL 26 04/08/2014 0850   LDLCALC 107 (H) 02/16/2022 1200   LDLDIRECT 131 (H) 01/13/2009 1920   Clinical ASCVD: Yes  The 10-year ASCVD risk score (Arnett DK, et al., 2019) is: 21.6%   Values used to calculate the score:     Age: 66 years     Sex: Female     Is Non-Hispanic African American: No     Diabetic: Yes     Tobacco smoker: Yes     Systolic Blood Pressure: 143 mmHg     Is BP treated: Yes     HDL Cholesterol: 64 mg/dL     Total Cholesterol: 192 mg/dL  Review of Systems  Gastrointestinal:  Positive for constipation.  All other systems reviewed and are negative.   Physical Exam Vitals reviewed.  Constitutional:      Appearance: Normal appearance.  Pulmonary:     Effort: Pulmonary effort is normal.  Neurological:     Mental Status: She is alert.  Psychiatric:        Mood and Affect: Mood normal.  Behavior: Behavior normal.        Thought Content: Thought content normal.        Judgment: Judgment normal.    Patient is participating in a Managed Medicaid Plan:  Yes  A/P: Diabetes longstanding currently nearly controlled. Patient is able to verbalize appropriate hypoglycemia management plan. Medication adherence appears good. Control is suboptimal, due to side effects of medication. - Continue semaglutide (Ozempic) 0.25 mg once weekly - Started Miralax (OTC) one capful every other day as needed for constipation. Discussed that she could  increase frequency to achieve desired bowel movements.  -Patient educated on purpose, proper use, and potential adverse effects of.  -Extensively discussed pathophysiology of diabetes, recommended lifestyle interventions, dietary effects on blood sugar control.  -Counseled on s/sx of and management of hypoglycemia.  -Next A1c anticipated 01/11/2023 at PCP visit.  - Sent refills of semaglutide (Ozempic) and pen needles to preferred pharmacy per Pt request  ASCVD risk - primary prevention in patient with diabetes. Last LDL is 107 not at goal of <09 mg/dL.  - Continued rosuvastatin (Crestor) 10 mg once daily - Continued ezetimibe (Zetia) 10 mg once daily  Hypertension longstanding currently uncontrolled. Blood pressure goal of <130/80 mmHg. Medication adherence good. Blood pressure control is suboptimal due to elevated systolic BP with low diastolic BP. Concern with lowering diastolic BP with addition of another agent.  -Continued losartan 100 mg once daily.  Tobacco use disorder with moderate nicotine dependence of many years duration in a patient who is fair candidate for success because of her progress to date.  Reluctant to use any medication to assist with intake reduction, but has reduced to 1.5 cartons/month from 3 cartons/month.  Short Term Goal: reducing tobacco intake to 7 cigarettes/day by the end of the year  Written patient instructions provided. Patient verbalized understanding of treatment plan.  Total time in face to face counseling 27 minutes.    Follow-up:  Pharmacist PRN after PCP visit PCP clinic visit 01/11/2023 Patient seen with Caprice Beaver, PharmD Candidate and Shona Simpson, PharmD Candidate.

## 2022-10-27 NOTE — Patient Instructions (Addendum)
It was nice to see you today!  Your goal blood sugar is 80-130 before eating and less than 180 after eating. With a gradual reduction in weight by 3-5 lbs a month.  Medication Changes: START Miralax, one capful, every other day for constipation. Can increase frequency of taking Miralax to get desired bowel movements.   Continue semaglutide (Ozempic) 0.25 mg once weekly  Continue all other medication the same.   Monitor blood sugars at home and keep a log (glucometer or piece of paper) to bring with you to your next visit.  Keep up the good work with diet and exercise. Aim for a diet full of vegetables, fruit and lean meats (chicken, [place], fish). Try to limit salt intake by eating fresh or frozen vegetables (instead of canned), rinse canned vegetables prior to cooking and do not add any additional salt to meals.    Tobacco Patient Instructions  Great job on cutting down your cigarette intake!  Short Term Goal: Reduce smoking to 7 cigarettes/day by the end of the year  Quitting smoking is one of the most important decisions you can make for your current and future health. Consider what you dislike about smoking and how quitting could personally benefit you. Try to cut down.   Starting today, Be a Quitter!  Remind yourself why you want to quit.  Delay your first cigarette of the day for as long as possible.  Start cleaning out all pockets, drawers, and your car of cigarettes.  Getting Through the Cravings Once You Are Smoke Free: Each craving will last about 10 minutes, whether or not you smoke. Here's how to get through the cravings without cigarettes:  DELAY: Tell yourself that you'll wait for the next craving. Do it every time! DEEP BREATHS: One reason smoking feels good is because you breathe in deeply to inhale. Take four slow, deep breaths and feel the relaxation without the hamful effects of cigarettes. DRINK WATER: Drink a glass of cool water. It will give your hands and  mouth something to do and will help flush the nicotine out of your system faster. DIVERT: Do something else -- brush your teeth, take a walk, call a friend who can offer you support. Just moving onto something other than thinking about cigarettes will move you through the craving.   Frequently Asked Questions  What can I do when I get the urge to smoke? To get through the urge to smoke, try the following:  Review your reasons for quitting and think of all the benefits to your health, your finances, and your family.  Remind yourself that there is no such thing as just one cigarette -- or even one puff.  Ride out the desire to smoke. Use the 4 Os -- Delay, Deep Breaths, Drink Water and Divert to get you through. The craving will go away eventually. Do not fool yourself into thinking you can have just one cigarette.  Any tips on how to deal with stress? Stress is a natural part of life. The key is to deal with it without reaching for a cigarette. Taking deep breaths, counting backwards from 10 and asking yourself 1-how big a deal is this?"  Writing down your feelings, talking with a friend and doing things like positive self-talk and meditation are some other ways that people deal with daily stress.  What if I start smoking again? Slips happen. Most people try to quit smoking a few times before they are successful. Don't beat yourself up if this  happens to you! Ask yourself if this was a slip or a relapse. A slip is a one-time mistake that is quickly corrected. A relapse is going back to your old smoking habits.   If you slip, don't give up. Think of it as a learning experience. Ask yourself what went wrong and renew your commitment to staying away from smoking for good.  If you relapse, try not to get discouraged. Ask yourself the question "What caused me to start smoking?" Figure out what helped you and what didn't when you tried to quit. Knowing why you relapsed is useful information for your  next attempt to quit.

## 2022-10-28 NOTE — Progress Notes (Signed)
Reviewed and agree with Dr Koval's plan.   

## 2022-10-28 NOTE — Progress Notes (Signed)
.  tmko 

## 2022-10-30 ENCOUNTER — Other Ambulatory Visit: Payer: Self-pay | Admitting: Family Medicine

## 2022-11-01 ENCOUNTER — Other Ambulatory Visit: Payer: Self-pay | Admitting: Family Medicine

## 2022-11-02 ENCOUNTER — Other Ambulatory Visit: Payer: Self-pay | Admitting: Family Medicine

## 2022-11-18 ENCOUNTER — Telehealth: Payer: Self-pay | Admitting: Family Medicine

## 2022-11-18 ENCOUNTER — Other Ambulatory Visit: Payer: Self-pay | Admitting: Family Medicine

## 2022-11-18 NOTE — Telephone Encounter (Addendum)
I called to check on her Ozempic as I received a refill request. She said this medication makes her constipated. Although, it should typically give her diarrhea. She uses Miralax as needed for constipation. She does not want to go up on her Ozempic for now. She will keep taking the low dose and if symptoms persist, she will d/c meds altogether. She should have med refill at the pharmacy. She'll pick up med soon.

## 2022-11-20 ENCOUNTER — Other Ambulatory Visit: Payer: Self-pay | Admitting: Pulmonary Disease

## 2022-12-23 ENCOUNTER — Telehealth: Payer: Self-pay

## 2022-12-23 NOTE — Telephone Encounter (Signed)
Patient calls nurse line regarding cough. Reports that cough is productive with clear sputum.   Feels worse when she is trying to rest.   Singulair has not been helping.   Denies fever, body aches, sore throat, chills, runny nose.   She is asking if any other medication can be sent in for cough.   Advised that she would need an appointment to evaluate cough further. Offered appointment tomorrow morning. Patient declines to see any provider other than PCP. Scheduled patient for mychart visit tomorrow morning.   Veronda Prude, RN

## 2022-12-24 ENCOUNTER — Telehealth: Payer: 59 | Admitting: Family Medicine

## 2022-12-24 ENCOUNTER — Encounter: Payer: Self-pay | Admitting: Family Medicine

## 2022-12-24 DIAGNOSIS — R051 Acute cough: Secondary | ICD-10-CM | POA: Diagnosis not present

## 2022-12-24 MED ORDER — PROMETHAZINE-DM 6.25-15 MG/5ML PO SYRP: 5.0000 mL | ORAL_SOLUTION | Freq: Four times a day (QID) | ORAL | 0 refills | Status: DC | PRN

## 2022-12-24 NOTE — Progress Notes (Signed)
[facility] Telemedicine Visit  Patient consented to have virtual visit and was identified by name and date of birth. Method of visit: Telephone  Encounter participants: Patient: Betty Chapman - located at Work Provider: Janit Pagan - located at [facility] office Others (if applicable): N/A  Chief Complaint: Cough  HPI:  Cough This is a new problem. The current episode started in the past 7 days. The problem has been waxing and waning. Episode frequency: Cough is better during the day, but worse at nighttime. The cough is Productive of sputum (Clear productive sputum with no blood in it). Associated symptoms include headaches. Pertinent negatives include no chills, ear congestion, fever, nasal congestion, postnasal drip, sore throat, shortness of breath or wheezing. Associated symptoms comments: Everytime she coughs she would have her urine leak. She denies dysuria or change in her urine color. Nothing aggravates the symptoms. Risk factors for lung disease include occupational exposure (Not sure if people are sick at work. But exposed to all kinds of people). She has tried a beta-agonist inhaler (Ricola drops, sinulair, home albuterol with neb and other inhalers) for the symptoms. The treatment provided moderate (She has been taking her Singulair more often at night with some improvement sometimes) relief. Her past medical history is significant for COPD.    ROS: per HPI  Pertinent PMHx: Yes  Exam:  LMP 12/27/2011   Respiratory: She could complete full sentences without distress  Assessment/Plan:  Cough: Differentials include viral cough, allergy vs COPD symptoms Continue current home COPD regimen I escribed promethazine DM for prn cough Tylenol as needed for fever or headache She was advised to reach out soon if her symptoms does not improve To consider chest xray or A/B at that time She agreed with the plan   Time spent during visit with patient: 15  minutes

## 2022-12-24 NOTE — Patient Instructions (Signed)
   [facility] Diagnosis and Treatment of Childhood Mood Disorders, ADHD, Autism, and Developmental Delay  [address], [address] [place],  [place]  [address] Get Driving Directions Main: [phone]  Assessments for ADHD and Therapy for Children  [facility]: [phone] [facility] [address], [place], [place] [address]  [phone]  [phone]  [facility]- [facility] for Mental Health Disorders  This also provides regular therapy at low cost for children Therapists speak Spanish and English  [address], [place], [place] [address] Monday - Friday: 8:30am-12:00pm / 1:00pm-2:30pm  

## 2023-01-11 ENCOUNTER — Encounter: Payer: Self-pay | Admitting: Family Medicine

## 2023-01-11 ENCOUNTER — Ambulatory Visit: Payer: 59 | Admitting: Family Medicine

## 2023-01-11 ENCOUNTER — Other Ambulatory Visit (HOSPITAL_COMMUNITY): Payer: Self-pay

## 2023-01-11 VITALS — BP 142/65 | HR 83

## 2023-01-11 DIAGNOSIS — E1169 Type 2 diabetes mellitus with other specified complication: Secondary | ICD-10-CM | POA: Diagnosis not present

## 2023-01-11 DIAGNOSIS — I10 Essential (primary) hypertension: Secondary | ICD-10-CM | POA: Diagnosis not present

## 2023-01-11 DIAGNOSIS — E785 Hyperlipidemia, unspecified: Secondary | ICD-10-CM

## 2023-01-11 DIAGNOSIS — J449 Chronic obstructive pulmonary disease, unspecified: Secondary | ICD-10-CM

## 2023-01-11 LAB — POCT GLYCOSYLATED HEMOGLOBIN (HGB A1C): HbA1c, POC (controlled diabetic range): 6.2 % (ref 0.0–7.0)

## 2023-01-11 MED ORDER — SEMAGLUTIDE(0.25 OR 0.5MG/DOS) 2 MG/1.5ML ~~LOC~~ SOPN: 0.5000 mg | PEN_INJECTOR | SUBCUTANEOUS | 1 refills | Status: DC

## 2023-01-11 NOTE — Assessment & Plan Note (Signed)
 Well controlled on Ozempic A1C improved from 7.1 to 6.2 She wants to increase her Ozempic dose for some weight loss benefit as well Increased dose to Ozempic  0.5 mg weekly Monitor CBG closely at home F/U in 4 weeks for reassessment.

## 2023-01-11 NOTE — Progress Notes (Signed)
    SUBJECTIVE:   CHIEF COMPLAINT / HPI:   DM2/HTN/HLD:  She is here for follow up with her chronic conditions. She is on Ozempic  0.25 mg every day week for DM2. She endorsed improvement in her GI symptoms with Ozepic. She takes Miralax prn constipation.  For HTN, she is compliant with Losartan  100 mg every day and on Crestor  10 mg every day for HLD. She felt she had poor diet in the past week eating mor carbs then usual.   COPD: She received a letter from her insurance about Dulera  coverage. This medication works well for her and she would hate to be of it. She is otherwise compliant with her Spiriva  and Dulera .  PERTINENT  PMH / PSH: PMHx reviewed  OBJECTIVE:   BP (!) 142/65   Pulse 83   LMP 12/27/2011   SpO2 98%   Physical Exam Vitals and nursing note reviewed.  Cardiovascular:     Rate and Rhythm: Normal rate and regular rhythm.     Heart sounds: Normal heart sounds. No murmur heard. Pulmonary:     Effort: Pulmonary effort is normal. No respiratory distress.     Breath sounds: Normal breath sounds. No wheezing.  Abdominal:     General: Bowel sounds are normal. There is no distension.     Palpations: Abdomen is soft. There is no mass.     Tenderness: There is no abdominal tenderness.  Musculoskeletal:     Right lower leg: No edema.     Left lower leg: No edema.      ASSESSMENT/PLAN:   Diabetes mellitus Well controlled on Ozempic  A1C improved from 7.1 to 6.2 She wants to increase her Ozempic  dose for some weight loss benefit as well Increased dose to Ozempic   0.5 mg weekly Monitor CBG closely at home F/U in 4 weeks for reassessment.    Hypertension BP slightly above goal Monitor for now on her current regimen Bmet checked today  Hyperlipidemia associated with type 2 diabetes mellitus (HCC) FLP checked  COPD (chronic obstructive pulmonary disease) (HCC) STable on Dulera  and Spiriva  She will call her insurance company about her medication coverage I will  message our pharmacy tech to help with this as well     Otto Fairly, MD [facility]

## 2023-01-11 NOTE — Patient Instructions (Signed)
 Semaglutide Injection What is this medication? SEMAGLUTIDE (SEM a GLOO tide) treats type 2 diabetes. It works by increasing insulin levels in your body, which decreases your blood sugar (glucose).   It also reduces the amount of sugar released into your blood and slows down your digestion. It may also be used to lower the risk of heart attack and stroke in people with type 2 diabetes. Changes to diet and exercise are often combined with this medication. This medicine may be used for other purposes; ask your health care provider or pharmacist if you have questions. COMMON BRAND NAME(S): OZEMPIC What should I tell my care team before I take this medication? They need to know if you have any of these conditions: Endocrine tumors (MEN 2) or if someone in your family had these tumors Eye disease, vision problems History of pancreatitis Kidney disease Stomach problems Thyroid cancer or if someone in your family had thyroid cancer An unusual or allergic reaction to semaglutide, other medications, foods, dyes, or preservatives Pregnant or trying to get pregnant Breast-feeding How should I use this medication? This medication is for injection under the skin of your upper leg (thigh), stomach area, or upper arm. It is given once every week (every 7 days). You will be taught how to prepare and give this medication. Use exactly as directed. Take your medication at regular intervals. Do not take it more often than directed. If you use this medication with insulin, you should inject this medication and the insulin separately. Do not mix them together. Do not give the injections right next to each other. Change (rotate) injection sites with each injection. It is important that you put your used needles and syringes in a special sharps container. Do not put them in a trash can. If you do not have a sharps container, call your pharmacist or care team to get one. A special MedGuide will be given to you by the  pharmacist with each prescription and refill. Be sure to read this information carefully each time. This medication comes with INSTRUCTIONS FOR USE. Ask your pharmacist for directions on how to use this medication. Read the information carefully. Talk to your pharmacist or care team if you have questions. Talk to your care team about the use of this medication in children. Special care may be needed. Overdosage: If you think you have taken too much of this medicine contact a poison control center or emergency room at once. NOTE: This medicine is only for you. Do not share this medicine with others. What if I miss a dose? If you miss a dose, take it as soon as you can within 5 days after the missed dose. Then take your next dose at your regular weekly time. If it has been longer than 5 days after the missed dose, do not take the missed dose. Take the next dose at your regular time. Do not take double or extra doses. If you have questions about a missed dose, contact your care team for advice. What may interact with this medication? Other medications for diabetes Many medications may cause changes in blood sugar, these include: Alcohol containing beverages Antiviral medications for HIV or AIDS Aspirin and aspirin-like medications Certain medications for blood pressure, heart disease, irregular heart beat Chromium Diuretics Female hormones, such as estrogens or progestins, birth control pills Fenofibrate Gemfibrozil Isoniazid Lanreotide Female hormones or anabolic steroids MAOIs like Carbex, Eldepryl, Marplan, Nardil, and Parnate Medications for weight loss Medications for allergies, asthma, cold, or cough Medications  for depression, anxiety, or psychotic disturbances Niacin Nicotine NSAIDs, medications for pain and inflammation, like ibuprofen or naproxen Octreotide Pasireotide Pentamidine Phenytoin Probenecid Quinolone antibiotics such as ciprofloxacin, levofloxacin, ofloxacin Some  herbal dietary supplements Steroid medications such as prednisone or cortisone Sulfamethoxazole; trimethoprim Thyroid hormones Some medications can hide the warning symptoms of low blood sugar (hypoglycemia). You may need to monitor your blood sugar more closely if you are taking one of these medications. These include: Beta-blockers, often used for high blood pressure or heart problems (examples include atenolol, metoprolol, propranolol) Clonidine Guanethidine Reserpine This list may not describe all possible interactions. Give your health care provider a list of all the medicines, herbs, non-prescription drugs, or dietary supplements you use. Also tell them if you smoke, drink alcohol, or use illegal drugs. Some items may interact with your medicine. What should I watch for while using this medication? Visit your care team for regular checks on your progress. Drink plenty of fluids while taking this medication. Check with your care team if you get an attack of severe diarrhea, nausea, and vomiting. The loss of too much body fluid can make it dangerous for you to take this medication. A test called the HbA1C (A1C) will be monitored. This is a simple blood test. It measures your blood sugar control over the last 2 to 3 months. You will receive this test every 3 to 6 months. Learn how to check your blood sugar. Learn the symptoms of low and high blood sugar and how to manage them. Always carry a quick-source of sugar with you in case you have symptoms of low blood sugar. Examples include hard sugar candy or glucose tablets. Make sure others know that you can choke if you eat or drink when you develop serious symptoms of low blood sugar, such as seizures or unconsciousness. They must get medical help at once. Tell your care team if you have high blood sugar. You might need to change the dose of your medication. If you are sick or exercising more than usual, you might need to change the dose of your  medication. Do not skip meals. Ask your care team if you should avoid alcohol. Many nonprescription cough and cold products contain sugar or alcohol. These can affect blood sugar. Pens should never be shared. Even if the needle is changed, sharing may result in passing of viruses like hepatitis or HIV. Wear a medical ID bracelet or chain, and carry a card that describes your disease and details of your medication and dosage times. What side effects may I notice from receiving this medication? Side effects that you should report to your care team as soon as possible: Allergic reactions--skin rash, itching, hives, swelling of the face, lips, tongue, or throat Change in vision Dehydration--increased thirst, dry mouth, feeling faint or lightheaded, headache, dark yellow or brown urine Gallbladder problems--severe stomach pain, nausea, vomiting, fever Heart palpitations--rapid, pounding, or irregular heartbeat Kidney injury--decrease in the amount of urine, swelling of the ankles, hands, or feet Pancreatitis--severe stomach pain that spreads to your back or gets worse after eating or when touched, fever, nausea, vomiting Thoughts of suicide or self-harm, worsening mood, feelings of depression Thyroid cancer--new mass or lump in the neck, pain or trouble swallowing, trouble breathing, hoarseness Side effects that usually do not require medical attention (report these to your care team if they continue or are bothersome): Diarrhea Loss of appetite Nausea Upset stomach This list may not describe all possible side effects. Call your doctor for medical  advice about side effects. You may report side effects to FDA at 1-800-FDA-1088. Where should I keep my medication? Keep out of the reach of children. Store unopened pens in a refrigerator between 2 and 8 degrees C (36 and 46 degrees F). Do not freeze. Protect from light and heat. After you first use the pen, it can be stored for 56 days at room  temperature between 15 and 30 degrees C (59 and 86 degrees F) or in a refrigerator. Throw away your used pen after 56 days or after the expiration date, whichever comes first. Do not store your pen with the needle attached. If the needle is left on, medication may leak from the pen. NOTE: This sheet is a summary. It may not cover all possible information. If you have questions about this medicine, talk to your doctor, pharmacist, or health care provider.  2024 Elsevier/Gold Standard (2022-03-29 00:00:00)

## 2023-01-11 NOTE — Assessment & Plan Note (Signed)
FLP checked. 

## 2023-01-11 NOTE — Assessment & Plan Note (Signed)
 STable on Dulera and Spiriva She will call her insurance company about her medication coverage I will message our pharmacy tech to help with this as well

## 2023-01-11 NOTE — Assessment & Plan Note (Addendum)
 BP slightly above goal Monitor for now on her current regimen Bmet checked today

## 2023-01-12 LAB — BASIC METABOLIC PANEL
BUN/Creatinine Ratio: 11 — ABNORMAL LOW (ref 12–28)
BUN: 7 mg/dL — ABNORMAL LOW (ref 8–27)
CO2: 22 mmol/L (ref 20–29)
Calcium: 9.6 mg/dL (ref 8.7–10.3)
Chloride: 103 mmol/L (ref 96–106)
Creatinine, Ser: 0.64 mg/dL (ref 0.57–1.00)
Glucose: 87 mg/dL (ref 70–99)
Potassium: 4.3 mmol/L (ref 3.5–5.2)
Sodium: 140 mmol/L (ref 134–144)
eGFR: 100 mL/min/{1.73_m2} (ref >59)

## 2023-01-12 LAB — LIPID PANEL
Chol/HDL Ratio: 2.4 {ratio} (ref 0.0–4.4)
Cholesterol, Total: 145 mg/dL (ref 100–199)
HDL: 60 mg/dL (ref >39)
LDL Chol Calc (NIH): 65 mg/dL (ref 0–99)
Triglycerides: 112 mg/dL (ref 0–149)
VLDL Cholesterol Cal: 20 mg/dL (ref 5–40)

## 2023-03-07 ENCOUNTER — Other Ambulatory Visit: Payer: Self-pay | Admitting: Family Medicine

## 2023-03-07 DIAGNOSIS — E119 Type 2 diabetes mellitus without complications: Secondary | ICD-10-CM | POA: Diagnosis not present

## 2023-03-07 LAB — HM DIABETES EYE EXAM

## 2023-03-09 ENCOUNTER — Encounter: Payer: Self-pay | Admitting: Pulmonary Disease

## 2023-03-09 ENCOUNTER — Ambulatory Visit: Payer: 59 | Admitting: Pulmonary Disease

## 2023-03-09 VITALS — BP 144/79 | HR 79 | Ht 59.0 in | Wt 146.0 lb

## 2023-03-09 DIAGNOSIS — Z716 Tobacco abuse counseling: Secondary | ICD-10-CM

## 2023-03-09 DIAGNOSIS — F17210 Nicotine dependence, cigarettes, uncomplicated: Secondary | ICD-10-CM

## 2023-03-09 DIAGNOSIS — J449 Chronic obstructive pulmonary disease, unspecified: Secondary | ICD-10-CM | POA: Diagnosis not present

## 2023-03-09 NOTE — Patient Instructions (Signed)
 Glad you are doing well  No changes in medication  Make sure to get the repeat CT scan August 2025  Return clinic in 1 year or sooner as needed with Dr. Judeth Horn

## 2023-03-09 NOTE — Progress Notes (Signed)
 @Patient  ID: Betty Chapman, female    DOB: Jul 13, 1960, 63 y.o.   MRN: [id number]  Chief Complaint  Patient presents with   Follow-up    Referring provider: Doreene Eland, MD  HPI:   63 y.o. woman whom are seen in follow up for evaluation of COPD and dyspnea on exertion.  Most recent PCP note x 3 reviewed.  Overall, doing well.  No issues.  Good adherence to high-dose Dulera and Spiriva. Breathing stable.  No complaints.  Cough okay, at baseline.  Reviewed cancer screening scan in interim, lung RADS 1, recommend 1 year follow-up B 08/2023.  Arranging via PCP.  HPI at initial visit: Patient has been undergoing CT lung cancer screening via PCP.  Recently 08/19/2021.  A moderate interpretation reveals emphysematous changes that are mild primarily in the upper lobes, and scattered thickened bronchioles.  There is mention of mildly enlarged pulmonary artery.  She had TTE 10/7-23 that reveals EF of around 45%, grade 1 diastolic dysfunction, normal RA size, normal RV size, normal RV function, normal estimated PASP.  She had PFTs in 2019 that demonstrate severe COPD.  Overall, she thinks inhalers work well for her.  She is on high-dose Dulera and Spiriva.  When she misses doses she no significant worsening in breathing.  Denies any significant history of exacerbations.  No recent prednisone use outpatient.  Her dyspnea is worse on inclines or stairs.  At time of day when things are better or worse.  No position make things better or worse.  No environmental seasonal factors she can identify that make things better or worse.  No other relieving or exacerbating factors.  PMH: GERD, hypertension, asthma Surgical history: Cholecystectomy, tubal ligation Social history: Current everyday smoker, lives in [place] Family history: Mother with brain aneurysm, brother with prostate cancer  Questionaires / Pulmonary Flowsheets:   ACT:      No data to display          MMRC:     No data  to display          Epworth:      No data to display          Tests:   FENO:  No results found for: "NITRICOXIDE"  PFT:     No data to display          WALK:      No data to display          Imaging: Personally reviewed and as per EMR and discussion in this note No results found.  Lab Results: Personally reviewed CBC    Component Value Date/Time   WBC 7.6 01/07/2022 1151   WBC 9.3 07/13/2014 1820   RBC 5.08 01/07/2022 1151   RBC 4.95 07/13/2014 1820   HGB 15.8 01/07/2022 1151   HCT 46.1 01/07/2022 1151   PLT 350 01/07/2022 1151   MCV 91 01/07/2022 1151   MCH 31.1 01/07/2022 1151   MCH 31.7 07/13/2014 1820   MCHC 34.3 01/07/2022 1151   MCHC 34.0 07/13/2014 1820   RDW 12.5 01/07/2022 1151   LYMPHSABS 3.6 (H) 01/07/2022 1151   MONOABS 0.5 02/20/2014 1931   EOSABS 0.2 01/07/2022 1151   BASOSABS 0.0 01/07/2022 1151    BMET    Component Value Date/Time   NA 140 01/11/2023 1119   K 4.3 01/11/2023 1119   CL 103 01/11/2023 1119   CO2 22 01/11/2023 1119   GLUCOSE 87 01/11/2023 1119   GLUCOSE 124 (H) 07/13/2014  1820   BUN 7 (L) 01/11/2023 1119   CREATININE 0.64 01/11/2023 1119   CALCIUM 9.6 01/11/2023 1119   GFRNONAA 101 12/18/2018 0858   GFRAA 117 12/18/2018 0858    BNP No results found for: "BNP"  ProBNP No results found for: "PROBNP"  Specialty Problems       Pulmonary Problems   COPD (chronic obstructive pulmonary disease) (HCC)    Allergies  Allergen Reactions   Mushroom Extract Complex (Obsolete) Anaphylaxis and Rash   Pravastatin Sodium Hives and Itching   Atorvastatin Itching    Hives, itchnig    Immunization History  Administered Date(s) Administered   Influenza,inj,Quad PF,6+ Mos 11/10/2017, 09/01/2018   Pneumococcal Polysaccharide-23 11/10/2017   Td 01/04/2001   Tdap 07/04/2018    Past Medical History:  Diagnosis Date   Blurry vision, bilateral 03/20/2014   Callus of foot 07/16/2014   COPD (chronic  obstructive pulmonary disease) (HCC)    COVID-19 virus infection    Dysphagia, pharyngoesophageal phase    Epidermal cyst of face 07/16/2014   Hypercholesterolemia    Hypertension    TIA (transient ischemic attack)    Once   Tobacco abuse    Unintended weight loss 02/06/2019    Tobacco History: Social History   Tobacco Use  Smoking Status Every Day   Current packs/day: 1.00   Average packs/day: 1 pack/day for 53.2 years (53.2 ttl pk-yrs)   Types: Cigarettes   Start date: 01/04/1970  Smokeless Tobacco Never  Tobacco Comments   Cut down from 1 PPD to 0.5 PPD   Ready to quit: Not Answered Counseling given: Not Answered Tobacco comments: Cut down from 1 PPD to 0.5 PPD   Continue to not smoke  Outpatient Encounter Medications as of 03/09/2023  Medication Sig   albuterol (PROVENTIL) (2.5 MG/3ML) 0.083% nebulizer solution USE 1 VIAL VIA NEBULIZER EVERY 6 HOURS AS NEEDED FOR WHEEZING OR  SHORTNESS OF BREATH   albuterol (VENTOLIN HFA) 108 (90 Base) MCG/ACT inhaler USE 1 TO 2 INHALATIONS BY MOUTH  EVERY 6 HOURS AS NEEDED FOR  WHEEZING OR SHORTNESS OF BREATH   aspirin EC 81 MG tablet Take 1 tablet (81 mg total) by mouth daily. Swallow whole.   baclofen (LIORESAL) 10 MG tablet TAKE 1 TABLET BY MOUTH 1 TO 2  TIMES DAILY AS NEEDED FOR PAIN  AND MUSCLE TIGHTNESS (Patient taking differently: TAKE 1 TABLET BY MOUTH 1 TO 2  TIMES DAILY AS NEEDED FOR PAIN  AND MUSCLE TIGHTNESS)   blood glucose meter kit and supplies KIT Accu-Chek Aviva Plus Use up to TID daily as directed. (FOR ICD-9 250.00, 250.01).   Blood Glucose Monitoring Suppl DEVI 1 each by Does not apply route daily. May substitute to any manufacturer covered by patient's insurance.   DULERA 200-5 MCG/ACT AERO USE 2 INHALATIONS BY MOUTH TWICE DAILY   ezetimibe (ZETIA) 10 MG tablet TAKE 1 TABLET BY MOUTH DAILY   famotidine (PEPCID) 10 MG tablet Take 10 mg by mouth 2 (two) times daily.   Glucose Blood (BLOOD GLUCOSE TEST STRIPS) STRP 1 each by  In Vitro route daily. May substitute to any manufacturer covered by patient's insurance.   ibuprofen (ADVIL) 400 MG tablet Take 1 tablet (400 mg total) by mouth every 8 (eight) hours as needed for moderate pain.   Insulin Pen Needle (PEN NEEDLES) 31G X 5 MM MISC 1 Container by Does not apply route as needed.   losartan (COZAAR) 100 MG tablet TAKE 1 TABLET BY MOUTH DAILY  montelukast (SINGULAIR) 10 MG tablet TAKE 1 TABLET BY MOUTH AT  BEDTIME   pantoprazole (PROTONIX) 40 MG tablet TAKE 1 TABLET BY MOUTH EVERY DAY   rosuvastatin (CRESTOR) 10 MG tablet Take 1 tablet (10 mg total) by mouth daily.   Semaglutide,0.25 or 0.5MG /DOS, 2 MG/1.5ML SOPN Inject 0.5 mg into the skin once a week.   tiotropium (SPIRIVA HANDIHALER) 18 MCG inhalation capsule INHALE THE CONTENTS OF 1 CAPSULE BY MOUTH VIA INHALATION DEVICE  DAILY   No facility-administered encounter medications on file as of 03/09/2023.     Review of Systems  Review of Systems  N/a Physical Exam  BP (!) 144/79 (BP Location: Left Arm, Patient Position: Sitting, Cuff Size: Normal)   Pulse 79   Ht 4\' 11"  (1.499 m)   Wt 146 lb (66.2 kg)   LMP 12/27/2011   SpO2 97%   BMI 29.49 kg/m   Wt Readings from Last 5 Encounters:  03/09/23 146 lb (66.2 kg)  10/27/22 148 lb 6.4 oz (67.3 kg)  09/28/22 149 lb 9.6 oz (67.9 kg)  08/25/22 148 lb (67.1 kg)  08/17/22 148 lb 3.2 oz (67.2 kg)    BMI Readings from Last 5 Encounters:  03/09/23 29.49 kg/m  10/27/22 32.11 kg/m  09/28/22 32.37 kg/m  08/25/22 32.03 kg/m  08/17/22 32.07 kg/m     Physical Exam General: Sitting in chair, no acute distress Eyes: EOMI, no icterus Neck: Supple, JVP Pulmonary: Distant, clear, normal work of breathing Abdomen: Nontender, bowel sounds present Cardiovascular: Warm, no edema MSK: No synovitis, no joint effusion Neuro: Normal gait, no weakness Psych: Normal mood, full affect   Assessment & Plan:   Dyspnea on exertion: Likely multifactorial related to  severe COPD and prior spirometry, heart failure with EF 45% and chronotropic/inotropic incompetence.  Prior TTE without evidence of elevated PASP here signs of RV dysfunction etc.  Reassuring in terms of lack of pulmonary hypertension.  Symptoms worsen when does not use inhaler therapy.  Continue therapy for COPD as below.  Severe COPD: Gold B.  Based on PFTs, spirometry 11/2017, no bronchodilator response.  Continue triple inhaled therapy via high-dose Dulera and Spiriva.  Continue albuterol as needed.  No recent exacerbations.  Rare rescue inhaler use.  Smoking assessment and cessation counseling I have advised the patient to quit/stop smoking as soon as possible due to high risk for multiple medical problems.  It will also be very difficult for [place] to manage patient's  respiratory symptoms and status if we continue to expose her lungs to a known irritant.  We do not advise e-cigarettes as a form of stopping smoking. Patient is willing to quit smoking. I have advised the patient that we can assist and have options of nicotine replacement therapy, provided smoking cessation education today, provided smoking cessation counseling, and provided cessation resources. Follow-up next office visit office visit for assessment of smoking cessation.  I spent 3 minutes in tobacco cessation counseling.   Return in about 1 year (around 03/08/2024) for f/u Dr. Judeth Horn.   Karren Burly, MD 03/09/2023

## 2023-03-26 ENCOUNTER — Other Ambulatory Visit: Payer: Self-pay | Admitting: Family Medicine

## 2023-05-19 ENCOUNTER — Telehealth: Payer: Self-pay

## 2023-05-19 MED ORDER — ONETOUCH ULTRA MINI W/DEVICE KIT: PACK | Status: DC

## 2023-05-19 MED ORDER — ONETOUCH ULTRASOFT LANCETS MISC
6 refills | Status: DC
Start: 2023-05-19 — End: 2023-06-14

## 2023-05-19 NOTE — Telephone Encounter (Signed)
 I called the patient to clarify She just got a glucometer and now needs Lancet. Lancet escribed.

## 2023-05-19 NOTE — Telephone Encounter (Signed)
 Received fax from [facility] for refill on LANCET ONE TCH DEL PLUS. [name] see this on current medication list. If you want patient to have this. Please send RX to pharmacy. Linnie Riches, CMA

## 2023-05-20 ENCOUNTER — Other Ambulatory Visit: Payer: Self-pay | Admitting: Family Medicine

## 2023-05-20 ENCOUNTER — Encounter: Payer: Self-pay | Admitting: Family Medicine

## 2023-05-23 ENCOUNTER — Other Ambulatory Visit: Payer: Self-pay

## 2023-05-27 ENCOUNTER — Other Ambulatory Visit: Payer: Self-pay | Admitting: Family Medicine

## 2023-05-27 DIAGNOSIS — E1169 Type 2 diabetes mellitus with other specified complication: Secondary | ICD-10-CM

## 2023-06-14 ENCOUNTER — Ambulatory Visit (INDEPENDENT_AMBULATORY_CARE_PROVIDER_SITE_OTHER): Admitting: Family Medicine

## 2023-06-14 ENCOUNTER — Encounter: Payer: Self-pay | Admitting: Family Medicine

## 2023-06-14 VITALS — BP 123/69 | HR 88 | Wt 141.5 lb

## 2023-06-14 DIAGNOSIS — M160 Bilateral primary osteoarthritis of hip: Secondary | ICD-10-CM | POA: Diagnosis not present

## 2023-06-14 DIAGNOSIS — I700 Atherosclerosis of aorta: Secondary | ICD-10-CM

## 2023-06-14 DIAGNOSIS — E1169 Type 2 diabetes mellitus with other specified complication: Secondary | ICD-10-CM | POA: Diagnosis not present

## 2023-06-14 DIAGNOSIS — I5030 Unspecified diastolic (congestive) heart failure: Secondary | ICD-10-CM

## 2023-06-14 DIAGNOSIS — E889 Metabolic disorder, unspecified: Secondary | ICD-10-CM

## 2023-06-14 DIAGNOSIS — Z122 Encounter for screening for malignant neoplasm of respiratory organs: Secondary | ICD-10-CM | POA: Diagnosis not present

## 2023-06-14 DIAGNOSIS — R5383 Other fatigue: Secondary | ICD-10-CM | POA: Diagnosis not present

## 2023-06-14 DIAGNOSIS — J449 Chronic obstructive pulmonary disease, unspecified: Secondary | ICD-10-CM | POA: Diagnosis not present

## 2023-06-14 DIAGNOSIS — M169 Osteoarthritis of hip, unspecified: Secondary | ICD-10-CM | POA: Insufficient documentation

## 2023-06-14 DIAGNOSIS — E785 Hyperlipidemia, unspecified: Secondary | ICD-10-CM | POA: Diagnosis not present

## 2023-06-14 LAB — POCT GLYCOSYLATED HEMOGLOBIN (HGB A1C): HbA1c, POC (controlled diabetic range): 5.9 % (ref 0.0–7.0)

## 2023-06-14 MED ORDER — OZEMPIC (1 MG/DOSE) 4 MG/3ML ~~LOC~~ SOPN: 1.0000 mg | PEN_INJECTOR | SUBCUTANEOUS | 1 refills | Status: DC

## 2023-06-14 MED ORDER — TRAMADOL HCL 50 MG PO TABS: 50.0000 mg | ORAL_TABLET | Freq: Every day | ORAL | 0 refills | Status: AC | PRN

## 2023-06-14 MED ORDER — IBUPROFEN 400 MG PO TABS: 400.0000 mg | ORAL_TABLET | Freq: Three times a day (TID) | ORAL | 1 refills | Status: AC | PRN

## 2023-06-14 MED ORDER — BACLOFEN 10 MG PO TABS: 10.0000 mg | ORAL_TABLET | Freq: Two times a day (BID) | ORAL | 1 refills | Status: DC | PRN

## 2023-06-14 MED ORDER — ONETOUCH ULTRASOFT LANCETS MISC: 6 refills | Status: DC

## 2023-06-14 NOTE — Progress Notes (Signed)
 SUBJECTIVE:   CHIEF COMPLAINT / HPI:   Fatigue: C/O tiredness x 2 months. 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 She gets adequate rest at night and exercises occasionally but feels tired despite this.  Hip pain: C/O right hip pain exacerbation about 1 week ago. Pain radiated to her lower back and left hip. The pain was 10/10 in severity at the time, and she took her last dose of Oxycodone, which was prescribed a while ago. She also used her Baclofen  prn. Pain is about 2/10 in severity today. Wants a prescription for Oxycodone.   DM2: Compliant with Semaglutide  0.5 mg weekly with no s/e. Home CBG ranges from 79 - 110. No hypoglycemic episode. She wants to go up on the dose for additional weight loss benefits.  Other chronic problems: Takes ASA QOD for atherosclerosis and previous TIA. Otherwise, she is compliant with all her meds and inhalers.    PERTINENT  PMH / PSH: PMHx reviewed  OBJECTIVE:   BP 123/69   Pulse 88   Wt 141 lb 8 oz (64.2 kg)   LMP 12/27/2011   SpO2 98%   BMI 28.58 kg/m   Physical Exam Vitals and nursing note reviewed.  Cardiovascular:     Rate and Rhythm: Normal rate and regular rhythm.     Heart sounds: Normal heart sounds. No murmur heard. Pulmonary:     Effort: Pulmonary effort is normal. No respiratory distress.     Breath sounds: Normal breath sounds. No wheezing.  Abdominal:     General: Abdomen is flat. Bowel sounds are normal. There is no distension.     Palpations: Abdomen is soft. There is no mass.     Tenderness: There is no abdominal tenderness.  Musculoskeletal:     Comments: Sensory exam of the foot is normal, tested with the monofilament. Good pulses, no lesions or ulcers, good peripheral pulses. + Left foot callus       ASSESSMENT/PLAN:   Assessment & Plan Type 2 diabetes mellitus with other specified complication, without long-term current use of insulin (HCC) A1C checked  today looks good = 5.9 Increasing Ozempic  for additional weight loss benefit Monitor home CBG closely discussed Ozempic  1 mg weekly injection escribed, to resume once she is out of her 0.5 mg dose F/U in 3 months for A1C check Podiatry referral discussed for callus, but she declined Home foot care for now Other fatigue Well appearing ?? Stress related Discussed returning for lab today or later as phlebotomist is currently unavailable Lab appointment scheduled F/U sooner if symptoms worsens Osteoarthritis of both hips, unspecified osteoarthritis type B/L hip pain worse on the right Previous hip xray reviewed Continue Baclofen  prn - escribed Refilled Ibuprofen  prn As discussed with her, long-term opioid use is currently not indicated Given her recent flare, I escribed a short course of Tramadol only for breakthrough pain She agreed with the plan Chronic obstructive pulmonary disease, unspecified COPD type (HCC) Stable Work on smoking cessation Lung cancer screen CT ordered Continue current inhalers Diastolic congestive heart failure, unspecified HF chronicity (HCC) Stable ECHO reviewed and discussed with her - Recent EF 46% with GIDD Avoiding Betablocker given her soft and well controlled BP Monitor closely for now She agreed with the plan Hyperlipidemia associated with type 2 diabetes mellitus (HCC) Stable on Statin Aortic atherosclerosis (HCC) Statin and ASA    Declined PCV20 shot today  Penni Bowman, MD [facility]

## 2023-06-14 NOTE — Assessment & Plan Note (Addendum)
Stable on Statin

## 2023-06-14 NOTE — Assessment & Plan Note (Addendum)
 Stable Work on smoking cessation Lung cancer screen CT ordered Continue current inhalers

## 2023-06-14 NOTE — Assessment & Plan Note (Signed)
 A1C checked today looks good = 5.9 Increasing Ozempic  for additional weight loss benefit Monitor home CBG closely discussed Ozempic  1 mg weekly injection escribed, to resume once she is out of her 0.5 mg dose F/U in 3 months for A1C check Podiatry referral discussed for callus, but she declined Home foot care for now

## 2023-06-14 NOTE — Assessment & Plan Note (Addendum)
 Stable ECHO reviewed and discussed with her - Recent EF 46% with GIDD Avoiding Betablocker given her soft and well controlled BP Monitor closely for now She agreed with the plan

## 2023-06-14 NOTE — Assessment & Plan Note (Addendum)
 Statin and ASA

## 2023-06-14 NOTE — Patient Instructions (Signed)
 Will schedule lung cancer screen

## 2023-06-14 NOTE — Assessment & Plan Note (Addendum)
 B/L hip pain worse on the right Previous hip xray reviewed Continue Baclofen  prn - escribed Refilled Ibuprofen  prn As discussed with her, long-term opioid use is currently not indicated Given her recent flare, I escribed a short course of Tramadol only for breakthrough pain She agreed with the plan

## 2023-06-15 ENCOUNTER — Other Ambulatory Visit

## 2023-06-17 ENCOUNTER — Other Ambulatory Visit

## 2023-06-17 DIAGNOSIS — E889 Metabolic disorder, unspecified: Secondary | ICD-10-CM

## 2023-06-17 DIAGNOSIS — E1169 Type 2 diabetes mellitus with other specified complication: Secondary | ICD-10-CM

## 2023-06-17 DIAGNOSIS — R5383 Other fatigue: Secondary | ICD-10-CM

## 2023-06-21 ENCOUNTER — Other Ambulatory Visit

## 2023-06-21 DIAGNOSIS — E889 Metabolic disorder, unspecified: Secondary | ICD-10-CM | POA: Diagnosis not present

## 2023-06-21 DIAGNOSIS — R5383 Other fatigue: Secondary | ICD-10-CM

## 2023-06-21 DIAGNOSIS — E1169 Type 2 diabetes mellitus with other specified complication: Secondary | ICD-10-CM

## 2023-06-21 NOTE — Addendum Note (Signed)
 Addended by: Angelita Kendall on: 06/21/2023 10:16 AM   Modules accepted: Orders

## 2023-06-22 ENCOUNTER — Ambulatory Visit: Payer: Self-pay | Admitting: Family Medicine

## 2023-06-22 ENCOUNTER — Other Ambulatory Visit: Payer: Self-pay | Admitting: Family Medicine

## 2023-06-22 DIAGNOSIS — E559 Vitamin D deficiency, unspecified: Secondary | ICD-10-CM | POA: Insufficient documentation

## 2023-06-22 LAB — CBC
Hematocrit: 45.4 % (ref 34.0–46.6)
Hemoglobin: 15.1 g/dL (ref 11.1–15.9)
MCH: 31.7 pg (ref 26.6–33.0)
MCHC: 33.3 g/dL (ref 31.5–35.7)
MCV: 95 fL (ref 79–97)
Platelets: 314 10*3/uL (ref 150–450)
RBC: 4.77 x10E6/uL (ref 3.77–5.28)
RDW: 13 % (ref 11.7–15.4)
WBC: 10 10*3/uL (ref 3.4–10.8)

## 2023-06-22 LAB — BASIC METABOLIC PANEL WITH GFR
BUN/Creatinine Ratio: 16 (ref 12–28)
BUN: 11 mg/dL (ref 8–27)
CO2: 19 mmol/L — ABNORMAL LOW (ref 20–29)
Calcium: 9.4 mg/dL (ref 8.7–10.3)
Chloride: 106 mmol/L (ref 96–106)
Creatinine, Ser: 0.67 mg/dL (ref 0.57–1.00)
Glucose: 79 mg/dL (ref 70–99)
Potassium: 4.5 mmol/L (ref 3.5–5.2)
Sodium: 142 mmol/L (ref 134–144)
eGFR: 98 mL/min/{1.73_m2} (ref >59)

## 2023-06-22 LAB — TSH: TSH: 1.7 u[IU]/mL (ref 0.450–4.500)

## 2023-06-22 LAB — VITAMIN B12: Vitamin B-12: 285 pg/mL (ref 232–1245)

## 2023-06-22 LAB — VITAMIN D 25 HYDROXY (VIT D DEFICIENCY, FRACTURES): Vit D, 25-Hydroxy: 16.4 ng/mL — ABNORMAL LOW (ref 30.0–100.0)

## 2023-06-22 MED ORDER — ERGOCALCIFEROL 1.25 MG (50000 UT) PO CAPS: 50000.0000 [IU] | ORAL_CAPSULE | ORAL | 1 refills | Status: DC

## 2023-06-23 LAB — VITAMIN B12

## 2023-06-23 LAB — VITAMIN D 25 HYDROXY (VIT D DEFICIENCY, FRACTURES)

## 2023-06-23 LAB — TSH

## 2023-06-23 LAB — CBC

## 2023-06-23 LAB — BASIC METABOLIC PANEL WITH GFR

## 2023-06-24 ENCOUNTER — Other Ambulatory Visit: Payer: Self-pay | Admitting: Family Medicine

## 2023-06-30 ENCOUNTER — Other Ambulatory Visit: Payer: Self-pay | Admitting: Family Medicine

## 2023-07-05 ENCOUNTER — Telehealth: Payer: Self-pay

## 2023-07-05 NOTE — Telephone Encounter (Signed)
 Patient have a CT scheduled 07/10 @8  am

## 2023-07-05 NOTE — Telephone Encounter (Signed)
-----   Message from [facility] Reena S sent at 07/04/2023  5:00 PM EDT ----- [facility].   Cassell Mary

## 2023-07-06 ENCOUNTER — Other Ambulatory Visit: Payer: Self-pay | Admitting: Family Medicine

## 2023-07-06 DIAGNOSIS — Z1231 Encounter for screening mammogram for malignant neoplasm of breast: Secondary | ICD-10-CM

## 2023-07-14 ENCOUNTER — Ambulatory Visit (HOSPITAL_COMMUNITY)

## 2023-07-14 ENCOUNTER — Ambulatory Visit (HOSPITAL_COMMUNITY)
Admission: RE | Admit: 2023-07-14 | Discharge: 2023-07-14 | Disposition: A | Discharge status: Home / Self Care | Source: Ambulatory Visit | Attending: Family Medicine | Admitting: Family Medicine

## 2023-07-14 DIAGNOSIS — I700 Atherosclerosis of aorta: Secondary | ICD-10-CM | POA: Insufficient documentation

## 2023-07-14 DIAGNOSIS — Z122 Encounter for screening for malignant neoplasm of respiratory organs: Secondary | ICD-10-CM | POA: Diagnosis not present

## 2023-07-14 DIAGNOSIS — I2510 Atherosclerotic heart disease of native coronary artery without angina pectoris: Secondary | ICD-10-CM | POA: Insufficient documentation

## 2023-07-14 DIAGNOSIS — Z87891 Personal history of nicotine dependence: Secondary | ICD-10-CM | POA: Diagnosis not present

## 2023-07-14 DIAGNOSIS — N200 Calculus of kidney: Secondary | ICD-10-CM | POA: Diagnosis not present

## 2023-07-14 DIAGNOSIS — F17210 Nicotine dependence, cigarettes, uncomplicated: Secondary | ICD-10-CM | POA: Diagnosis not present

## 2023-07-15 ENCOUNTER — Other Ambulatory Visit: Payer: Self-pay | Admitting: Family Medicine

## 2023-07-17 ENCOUNTER — Other Ambulatory Visit: Payer: Self-pay | Admitting: Family Medicine

## 2023-07-20 ENCOUNTER — Ambulatory Visit: Payer: Self-pay | Admitting: Family Medicine

## 2023-07-20 ENCOUNTER — Ambulatory Visit
Admission: RE | Admit: 2023-07-20 | Discharge: 2023-07-20 | Disposition: A | Discharge status: Home / Self Care | Source: Ambulatory Visit | Attending: Family Medicine | Admitting: Family Medicine

## 2023-07-20 DIAGNOSIS — Z1231 Encounter for screening mammogram for malignant neoplasm of breast: Secondary | ICD-10-CM | POA: Diagnosis not present

## 2023-07-25 ENCOUNTER — Ambulatory Visit: Payer: Self-pay | Admitting: Family Medicine

## 2023-08-01 ENCOUNTER — Encounter: Payer: Self-pay | Admitting: Family Medicine

## 2023-08-01 ENCOUNTER — Other Ambulatory Visit: Payer: Self-pay | Admitting: Family Medicine

## 2023-08-01 MED ORDER — ONETOUCH ULTRASOFT LANCETS MISC: 1 refills | Status: DC

## 2023-08-01 MED ORDER — BLOOD GLUCOSE TEST VI STRP: 1.0000 | ORAL_STRIP | Freq: Every day | 1 refills | Status: DC

## 2023-08-06 ENCOUNTER — Other Ambulatory Visit: Payer: Self-pay | Admitting: Family Medicine

## 2023-08-20 ENCOUNTER — Other Ambulatory Visit: Payer: Self-pay | Admitting: Family Medicine

## 2023-08-29 ENCOUNTER — Other Ambulatory Visit: Payer: Self-pay | Admitting: Family Medicine

## 2023-09-15 ENCOUNTER — Ambulatory Visit (INDEPENDENT_AMBULATORY_CARE_PROVIDER_SITE_OTHER)

## 2023-09-15 VITALS — Wt 145.0 lb

## 2023-09-15 DIAGNOSIS — Z0000 Encounter for general adult medical examination without abnormal findings: Secondary | ICD-10-CM | POA: Diagnosis not present

## 2023-09-15 NOTE — Patient Instructions (Signed)
 Betty Chapman,  Thank you for taking the time for your Medicare Wellness Visit. I appreciate your continued commitment to your health goals. Please review the care plan we discussed, and feel free to reach out if I can assist you further.  Medicare recommends these wellness visits once per year to help you and your care team stay ahead of potential health issues. These visits are designed to focus on prevention, allowing your provider to concentrate on managing your acute and chronic conditions during your regular appointments.  Please note that Annual Wellness Visits do not include a physical exam. Some assessments may be limited, especially if the visit was conducted virtually. If needed, we may recommend a separate in-person follow-up with your provider.  Ongoing Care Seeing your primary care provider every 3 to 6 months helps us  monitor your health and provide consistent, personalized care.   Referrals If a referral was made during today's visit and you haven't received any updates within two weeks, please contact the referred provider directly to check on the status.  Recommended Screenings:  Health Maintenance  Topic Date Due   Pneumococcal Vaccine for age over 30 (2 of 2 - PCV) 11/11/2018   Flu Shot  08/05/2023   COVID-19 Vaccine (1 - 2024-25 season) Never done   Hemoglobin A1C  09/14/2023   Yearly kidney health urinalysis for diabetes  09/28/2023   Eye exam for diabetics  03/06/2024   Pap with HPV screening  04/23/2024   Colon Cancer Screening  06/09/2024   Complete foot exam   06/13/2024   Yearly kidney function blood test for diabetes  06/20/2024   Screening for Lung Cancer  07/13/2024   Medicare Annual Wellness Visit  09/14/2024   Breast Cancer Screening  07/19/2025   DTaP/Tdap/Td vaccine (3 - Td or Tdap) 07/03/2028   Hepatitis C Screening  Completed   HIV Screening  Completed   Hepatitis B Vaccine  Aged Out   HPV Vaccine  Aged Out   Meningitis B Vaccine  Aged Out    Zoster (Shingles) Vaccine  Discontinued       09/15/2023   10:20 AM  Advanced Directives  Does Patient Have a Medical Advance Directive? No  Would patient like information on creating a medical advance directive? No - Patient declined   Advance Care Planning is important because it: Ensures you receive medical care that aligns with your values, goals, and preferences. Provides guidance to your family and loved ones, reducing the emotional burden of decision-making during critical moments.  Vision: Annual vision screenings are recommended for early detection of glaucoma, cataracts, and diabetic retinopathy. These exams can also reveal signs of chronic conditions such as diabetes and high blood pressure.  Dental: Annual dental screenings help detect early signs of oral cancer, gum disease, and other conditions linked to overall health, including heart disease and diabetes.  Please see the attached documents for additional preventive care recommendations.

## 2023-09-15 NOTE — Progress Notes (Signed)
 Because this visit was a virtual/telehealth visit,  certain criteria was not obtained, such a blood pressure, CBG if applicable, and timed get up and go. Any medications not marked as taking were not mentioned during the medication reconciliation part of the visit. Any vitals not documented were not able to be obtained due to this being a telehealth visit or patient was unable to self-report a recent blood pressure reading due to a lack of equipment at home via telehealth. Vitals that have been documented are verbally provided by the patient.   Subjective:   Betty Chapman is a 63 y.o. who presents for a Medicare Wellness preventive visit.  As a reminder, Annual Wellness Visits don't include a physical exam, and some assessments may be limited, especially if this visit is performed virtually. We may recommend an in-person follow-up visit with your provider if needed.  Visit Complete: Virtual I connected with  Betty Chapman on 09/15/23 by a audio enabled telemedicine application and verified that I am speaking with the correct person using two identifiers.  Patient Location: Home  Provider Location: Office/Clinic  I discussed the limitations of evaluation and management by telemedicine. The patient expressed understanding and agreed to proceed.  Vital Signs: Because this visit was a virtual/telehealth visit, some criteria may be missing or patient reported. Any vitals not documented were not able to be obtained and vitals that have been documented are patient reported.  VideoDeclined- This patient declined Librarian, academic. Therefore the visit was completed with audio only.  Persons Participating in Visit: Patient.  AWV Questionnaire: No: Patient Medicare AWV questionnaire was not completed prior to this visit.  Cardiac Risk Factors include: advanced age (>52men, >67 women);sedentary lifestyle;smoking/ tobacco exposure;hypertension;dyslipidemia      Objective:    Today's Vitals   09/15/23 1018  Weight: 145 lb (65.8 kg)  PainSc: 0-No pain   Body mass index is 29.29 kg/m.     09/15/2023   10:20 AM 01/11/2023    8:19 AM 09/28/2022    8:21 AM 08/17/2022    8:17 AM 07/23/2022    8:37 AM 06/22/2022    8:25 AM 02/16/2022    8:22 AM  Advanced Directives  Does Patient Have a Medical Advance Directive? No No No No No No No  Would patient like information on creating a medical advance directive? No - Patient declined No - Patient declined No - Patient declined No - Patient declined No - Patient declined  No - Patient declined    Current Medications (verified) Outpatient Encounter Medications as of 09/15/2023  Medication Sig   albuterol  (PROVENTIL ) (2.5 MG/3ML) 0.083% nebulizer solution USE 1 VIAL VIA NEBULIZER EVERY 6 HOURS AS NEEDED FOR WHEEZING OR  SHORTNESS OF BREATH (Patient not taking: Reported on 06/14/2023)   albuterol  (VENTOLIN  HFA) 108 (90 Base) MCG/ACT inhaler USE 1 TO 2 INHALATIONS BY MOUTH  EVERY 6 HOURS AS NEEDED FOR  WHEEZING OR SHORTNESS OF BREATH (Patient not taking: Reported on 06/14/2023)   aspirin  EC 81 MG tablet Take 1 tablet (81 mg total) by mouth daily. Swallow whole.   baclofen  (LIORESAL ) 10 MG tablet Take 1 tablet (10 mg total) by mouth 2 (two) times daily as needed for muscle spasms.   blood glucose meter kit and supplies KIT Accu-Chek Aviva Plus Use up to TID daily as directed. (FOR ICD-9 250.00, 250.01).   Blood Glucose Monitoring Suppl (ONE TOUCH ULTRA MINI) w/Device KIT Daily CBG check   ezetimibe  (ZETIA ) 10 MG tablet  TAKE 1 TABLET BY MOUTH DAILY   Glucose Blood (BLOOD GLUCOSE TEST STRIPS) STRP 1 each by In Vitro route daily. May substitute to any manufacturer covered by patient's insurance   ibuprofen  (ADVIL ) 400 MG tablet Take 1 tablet (400 mg total) by mouth every 8 (eight) hours as needed for moderate pain (pain score 4-6).   Insulin Pen Needle (PEN NEEDLES) 31G X 5 MM MISC 1 Container by Does not apply route as  needed.   Lancets (ONETOUCH ULTRASOFT) lancets Use to check capillary glucose daily May substitute to any manufacturer covered by patient's insurance   losartan  (COZAAR ) 100 MG tablet TAKE 1 TABLET BY MOUTH DAILY   mometasone -formoterol  (DULERA ) 200-5 MCG/ACT AERO USE 2 INHALATIONS BY MOUTH TWICE DAILY   montelukast  (SINGULAIR ) 10 MG tablet TAKE 1 TABLET BY MOUTH AT  BEDTIME   pantoprazole  (PROTONIX ) 40 MG tablet TAKE 1 TABLET BY MOUTH EVERY DAY (Patient not taking: Reported on 06/14/2023)   rosuvastatin  (CRESTOR ) 10 MG tablet TAKE 1 TABLET BY MOUTH DAILY   Semaglutide , 1 MG/DOSE, (OZEMPIC , 1 MG/DOSE,) 4 MG/3ML SOPN INJECT 1MG  INTO THE SKIN ONCE A WEEK   tiotropium (SPIRIVA  HANDIHALER) 18 MCG inhalation capsule INHALE THE CONTENTS OF 1 CAPSULE BY MOUTH VIA INHALATION DEVICE  DAILY   Vitamin D , Ergocalciferol , (DRISDOL ) 1.25 MG (50000 UNIT) CAPS capsule TAKE 1 CAPSULE BY MOUTH ONE TIME PER WEEK   No facility-administered encounter medications on file as of 09/15/2023.    Allergies (verified) Mushroom extract complex (obsolete), Pravastatin  sodium, and Atorvastatin    History: Past Medical History:  Diagnosis Date   Blurry vision, bilateral 03/20/2014   Callus of foot 07/16/2014   COPD (chronic obstructive pulmonary disease) (HCC)    COVID-19 virus infection    Dysphagia, pharyngoesophageal phase    Epidermal cyst of face 07/16/2014   Hypercholesterolemia    Hypertension    TIA (transient ischemic attack)    Once   Tobacco abuse    Unintended weight loss 02/06/2019   Past Surgical History:  Procedure Laterality Date   BTL     CHOLECYSTECTOMY     COLONOSCOPY N/A 06/10/2014   Procedure: COLONOSCOPY;  Surgeon: Lamar CHRISTELLA Hollingshead, MD;  Location: [facility];  Service: Endoscopy;  Laterality: N/A;  1315   ECTOPIC PREGNANCY SURGERY     ESOPHAGOGASTRODUODENOSCOPY N/A 06/10/2014   Procedure: ESOPHAGOGASTRODUODENOSCOPY (EGD);  Surgeon: Lamar CHRISTELLA Hollingshead, MD;  Location: [facility];  Service:  Endoscopy;  Laterality: N/A;   MALONEY DILATION N/A 06/10/2014   Procedure: AGAPITO DILATION;  Surgeon: Lamar CHRISTELLA Hollingshead, MD;  Location: [facility];  Service: Endoscopy;  Laterality: N/A;   TUBAL LIGATION     Family History  Problem Relation Age of Onset   Aneurysm Mother        brain, cause of death   Cancer Brother        Dies of cancer, unsure what primary site   Prostate cancer Brother    Hypertension Daughter    Diabetes Daughter    Colon cancer Neg Hx    Breast cancer Neg Hx    Social History   Socioeconomic History   Marital status: Divorced    Spouse name: Not on file   Number of children: 2   Years of education: 12   Highest education level: Associate degree: occupational, Scientist, product/process development, or vocational program  Occupational History   Not on file  Tobacco Use   Smoking status: Every Day    Current packs/day: 1.00  Average packs/day: 1 pack/day for 53.7 years (53.7 ttl pk-yrs)    Types: Cigarettes    Start date: 01/04/1970   Smokeless tobacco: Never   Tobacco comments:    Cut down from 1 PPD to 0.5 PPD  Vaping Use   Vaping status: Never Used  Substance and Sexual Activity   Alcohol use: Yes    Alcohol/week: 0.0 standard drinks of alcohol    Comment: rarely    Drug use: Yes    Types: Marijuana    Comment: Occasionally   Sexual activity: Not Currently    Birth control/protection: Surgical  Other Topics Concern   Not on file  Social History Narrative   Patient lives with her brother and two dogs.    Patient is divorced and has 2 children.    Patient enjoys gardening and playing with her dogs.    Social Drivers of Corporate investment banker Strain: Low Risk  (09/15/2023)   Overall Financial Resource Strain (CARDIA)    Difficulty of Paying Living Expenses: Not very hard  Food Insecurity: No Food Insecurity (09/15/2023)   Hunger Vital Sign    Worried About Running Out of Food in the Last Year: Never true    Ran Out of Food in the Last Year: Never true   Transportation Needs: No Transportation Needs (09/15/2023)   PRAPARE - Administrator, Civil Service (Medical): No    Lack of Transportation (Non-Medical): No  Physical Activity: Inactive (09/15/2023)   Exercise Vital Sign    Days of Exercise per Week: 0 days    Minutes of Exercise per Session: 0 min  Stress: No Stress Concern Present (09/15/2023)   [employer] of Occupational Health - Occupational Stress Questionnaire    Feeling of Stress: Not at all  Social Connections: Socially Isolated (09/15/2023)   Social Connection and Isolation Panel    Frequency of Communication with Friends and Family: Once a week    Frequency of Social Gatherings with Friends and Family: Never    Attends Religious Services: 1 to 4 times per year    Active Member of [employer] or Organizations: No    Attends Engineer, structural: Never    Marital Status: Divorced    Tobacco Counseling Ready to quit: No Counseling given: No Tobacco comments: Cut down from 1 PPD to 0.5 PPD    Clinical Intake:  Pre-visit preparation completed: Yes  Pain : No/denies pain Pain Score: 0-No pain     BMI - recorded: 29.29 Nutritional Status: BMI 25 -29 Overweight Nutritional Risks: None Diabetes: Yes CBG done?: No Did pt. bring in CBG monitor from home?: No  Lab Results  Component Value Date   HGBA1C 5.9 06/14/2023   HGBA1C 6.2 01/11/2023   HGBA1C 7.1 (A) 09/28/2022     How often do you need to have someone help you when you read instructions, pamphlets, or other written materials from your doctor or pharmacy?: 1 - Never What is the last grade level you completed in school?: ASSOCIATE'S DEGREE  Interpreter Needed?: No  Information entered by :: Evely Gainey N. Omaira Mellen, LPN.   Activities of Daily Living     09/15/2023   10:20 AM  In your present state of health, do you have any difficulty performing the following activities:  Hearing? 0  Vision? 0  Difficulty concentrating or making  decisions? 0  Walking or climbing stairs? 0  Dressing or bathing? 0  Doing errands, shopping? 0  Preparing Food and eating ? N  Using the Toilet? N  In the past six months, have you accidently leaked urine? Y  Do you have problems with loss of bowel control? N  Managing your Medications? N  Managing your Finances? N  Housekeeping or managing your Housekeeping? N    Patient Care Team: Anders Otto DASEN, MD as PCP - General (Family Medicine) Shaaron Lamar HERO, MD as Consulting Physician (Gastroenterology) Abigail Maude POUR as Consulting Physician (Optometry)  I have updated your Care Teams any recent Medical Services you may have received from other providers in the past year.     Assessment:   This is a routine wellness examination for [facility].  Hearing/Vision screen Hearing Screening - Comments:: Denies hearing difficulties. Vision Screening - Comments:: Wears rx glasses - up to date with routine eye exams with Maude Abigail, OD.    Goals Addressed             This Visit's Progress    Patient Stated       09/15/23: To live day by day.       Depression Screen     09/15/2023   10:21 AM 06/14/2023    8:25 AM 01/11/2023    8:23 AM 09/28/2022    8:20 AM 08/25/2022   11:32 AM 08/17/2022    8:16 AM 07/23/2022    8:37 AM  PHQ 2/9 Scores  PHQ - 2 Score 0 0 0 0 0 0   PHQ- 9 Score 0 5 0 3 2 2    Exception Documentation       Patient refusal    Fall Risk     09/15/2023   10:20 AM 03/09/2023    8:20 AM 08/25/2022   11:34 AM 01/07/2022    9:55 AM 11/17/2021   11:31 AM  Fall Risk   Falls in the past year? 0 0 0 0 1  Number falls in past yr: 0  0 0 0  Injury with Fall? 0  0 0 0  Risk for fall due to : No Fall Risks  No Fall Risks  No Fall Risks  Follow up Falls evaluation completed  Falls prevention discussed;Education provided;Falls evaluation completed      MEDICARE RISK AT HOME:  Medicare Risk at Home Any stairs in or around the home?: No If so, are there any without  handrails?: No Home free of loose throw rugs in walkways, pet beds, electrical cords, etc?: Yes Adequate lighting in your home to reduce risk of falls?: Yes Life alert?: No Use of a cane, walker or w/c?: No Grab bars in the bathroom?: No Shower chair or bench in shower?: No Elevated toilet seat or a handicapped toilet?: No  TIMED UP AND GO:  Was the test performed?  No  Cognitive Function: Declined/Normal: No cognitive concerns noted by patient or family. Patient alert, oriented, able to answer questions appropriately and recall recent events. No signs of memory loss or confusion.    09/15/2023   10:23 AM  MMSE - Mini Mental State Exam  Not completed: Unable to complete        09/15/2023   10:19 AM 08/25/2022   11:41 AM 09/30/2020   11:11 AM  6CIT Screen  What Year? 0 points 0 points 0 points  What month? 0 points 0 points 0 points  What time? 0 points 0 points 0 points  Count back from 20 0 points 0 points 0 points  Months in reverse 0 points 0 points 0 points  Repeat phrase 0 points  0 points 0 points  Total Score 0 points 0 points 0 points    Immunizations Immunization History  Administered Date(s) Administered   Influenza,inj,Quad PF,6+ Mos 11/10/2017, 09/01/2018   Pneumococcal Polysaccharide-23 11/10/2017   Td 01/04/2001   Tdap 07/04/2018    Screening Tests Health Maintenance  Topic Date Due   Pneumococcal Vaccine: 50+ Years (2 of 2 - PCV) 11/11/2018   Influenza Vaccine  08/05/2023   COVID-19 Vaccine (1 - 2024-25 season) Never done   HEMOGLOBIN A1C  09/14/2023   Diabetic kidney evaluation - Urine ACR  09/28/2023   OPHTHALMOLOGY EXAM  03/06/2024   Cervical Cancer Screening (HPV/Pap Cotest)  04/23/2024   Colonoscopy  06/09/2024   FOOT EXAM  06/13/2024   Diabetic kidney evaluation - eGFR measurement  06/20/2024   Lung Cancer Screening  07/13/2024   Medicare Annual Wellness (AWV)  09/14/2024   Mammogram  07/19/2025   DTaP/Tdap/Td (3 - Td or Tdap) 07/03/2028    Hepatitis C Screening  Completed   HIV Screening  Completed   Hepatitis B Vaccines 19-59 Average Risk  Aged Out   HPV VACCINES  Aged Out   Meningococcal B Vaccine  Aged Out   Zoster Vaccines- Shingrix  Discontinued    Health Maintenance Items Addressed: Yes Patient declined all vaccines.  Patient is due for HgA1C.  Additional Screening:  Vision Screening: Recommended annual ophthalmology exams for early detection of glaucoma and other disorders of the eye. Is the patient up to date with their annual eye exam?  Yes  Who is the provider or what is the name of the office in which the patient attends annual eye exams? Maude Bring, OD.  Dental Screening: Recommended annual dental exams for proper oral hygiene  Community Resource Referral / Chronic Care Management: CRR required this visit?  No   CCM required this visit?  No   Plan:    I have personally reviewed and noted the following in the patient's chart:   Medical and social history Use of alcohol, tobacco or illicit drugs  Current medications and supplements including opioid prescriptions. Patient is not currently taking opioid prescriptions. Functional ability and status Nutritional status Physical activity Advanced directives List of other physicians Hospitalizations, surgeries, and ER visits in previous 12 months Vitals Screenings to include cognitive, depression, and falls Referrals and appointments  In addition, I have reviewed and discussed with patient certain preventive protocols, quality metrics, and best practice recommendations. A written personalized care plan for preventive services as well as general preventive health recommendations were provided to patient.   Roz LOISE Fuller, LPN   [id number]   After Visit Summary: (MyChart) Due to this being a telephonic visit, the after visit summary with patients personalized plan was offered to patient via MyChart   Notes: Nothing significant to report at this  time.

## 2023-10-01 ENCOUNTER — Other Ambulatory Visit: Payer: Self-pay | Admitting: Family Medicine

## 2023-11-17 ENCOUNTER — Other Ambulatory Visit: Payer: Self-pay | Admitting: Family Medicine

## 2023-11-18 ENCOUNTER — Other Ambulatory Visit: Payer: Self-pay | Admitting: Family Medicine

## 2023-11-20 ENCOUNTER — Encounter: Payer: Self-pay | Admitting: Family Medicine

## 2023-11-20 DIAGNOSIS — M1810 Unilateral primary osteoarthritis of first carpometacarpal joint, unspecified hand: Secondary | ICD-10-CM | POA: Insufficient documentation

## 2023-11-21 ENCOUNTER — Ambulatory Visit (INDEPENDENT_AMBULATORY_CARE_PROVIDER_SITE_OTHER): Admitting: Family Medicine

## 2023-11-21 ENCOUNTER — Encounter: Payer: Self-pay | Admitting: Family Medicine

## 2023-11-21 VITALS — BP 114/66 | HR 86 | Ht 59.0 in | Wt 132.0 lb

## 2023-11-21 DIAGNOSIS — E559 Vitamin D deficiency, unspecified: Secondary | ICD-10-CM

## 2023-11-21 DIAGNOSIS — M549 Dorsalgia, unspecified: Secondary | ICD-10-CM | POA: Diagnosis not present

## 2023-11-21 DIAGNOSIS — E1169 Type 2 diabetes mellitus with other specified complication: Secondary | ICD-10-CM

## 2023-11-21 DIAGNOSIS — I10 Essential (primary) hypertension: Secondary | ICD-10-CM | POA: Diagnosis not present

## 2023-11-21 DIAGNOSIS — M19012 Primary osteoarthritis, left shoulder: Secondary | ICD-10-CM

## 2023-11-21 DIAGNOSIS — G8929 Other chronic pain: Secondary | ICD-10-CM

## 2023-11-21 DIAGNOSIS — E785 Hyperlipidemia, unspecified: Secondary | ICD-10-CM

## 2023-11-21 LAB — POCT GLYCOSYLATED HEMOGLOBIN (HGB A1C): HbA1c, POC (controlled diabetic range): 5.8 % (ref 0.0–7.0)

## 2023-11-21 NOTE — Assessment & Plan Note (Addendum)
 Managed with Zetia  and Crestor . Cholesterol testing not due until next year. - Continue Zetia  10 mg daily. - Continue Crestor  10 mg daily.

## 2023-11-21 NOTE — Assessment & Plan Note (Addendum)
 Chronic shoulder pain due to osteoarthritis, exacerbated by cold weather. - Continue ibuprofen  as needed for shoulder pain.

## 2023-11-21 NOTE — Assessment & Plan Note (Addendum)
 Well-controlled with A1c at 5.8. Effective management with Ozempic  and weight loss. - Continue Ozempic  1 mg weekly. - Discussed dose reduction/taper. She prefer to continue current dose to attain her weight goal of 120 lbs - Monitor weight and A1c levels. - Encourage having last meal of the day after 6 pm to prevent morning hypoglycemia - Urine Microalbumin checked

## 2023-11-21 NOTE — Progress Notes (Signed)
 SUBJECTIVE:   CHIEF COMPLAINT / HPI:   Discussed the use of AI scribe software for clinical note transcription with the patient, who gave verbal consent to proceed.  History of Present Illness   Betty Chapman is a 63 year old female who presents with shoulder pain.  She experiences shoulder pain, which she attributes to arthritis. The pain worsens with cold weather, and she has been using ibuprofen  more frequently since it got cold. She carries ibuprofen  with her but has not taken it today due to not having eaten. She also mentions having back pain occasionally, but it has not been bothering her recently.  She is currently on several medications: albuterol  as needed, baby aspirin  every other day, baclofen  as needed, Zetia  10 mg daily, ibuprofen  as needed, losartan  100 mg daily, Dulera  inhaler twice daily, Singulair  10 mg daily, Protonix  40 mg daily as needed, Crestor  10 mg daily, Ozempic  1 mg weekly, Spiriva  once daily, and vitamin D  supplements. She has recently switched to taking Creole oil caplets for vitamin D , which contain 500 mg, and she takes one daily when she eats.  She is compliant with her Ozempic  1 mg weekly for DM and her home glucose are pretty much very well controlled. She had 3 episodes of low sugars to the 60s with no symptoms. This was typically in the morning. Last meal of the day is usually before 6 pm. This is routine for her.    She has experienced issues with her insurance no longer covering OneTouch test strips as of October 15th. She has not been able to test her blood sugar at home due to this issue. She has lost weight, reporting a decrease from 145 pounds to 132 pounds on Ozempic . She has declined pneumonia, flu, and COVID vaccinations.       PERTINENT  PMH / PSH: PMHx reviewed  OBJECTIVE:   BP 114/66   Pulse 86   Ht 4' 11 (1.499 m)   Wt 132 lb (59.9 kg)   LMP 12/27/2011   SpO2 97%   BMI 26.66 kg/m   Physical Exam   VITALS: BP-  114/66 MEASUREMENTS: Weight- 132. GEN: No distress CHEST: Lungs clear to auscultation bilaterally, no wheezing. CARDIOVASCULAR: Heart regular rate and rhythm, no murmurs. ABDOMEN: Normal bowel sounds, no abdominal distention, non-tender abdomen. EXTREMITIES: No ankle swelling, no edema.    Good ROM of her shoulders B/L  Results   LABS Vitamin D : 16.4 (07/2023) A1c: 5.8 (11/21/2023)  RADIOLOGY Shoulder X-ray: Degenerative joint disease       ASSESSMENT/PLAN:   Assessment & Plan Type 2 diabetes mellitus with other specified complication, without long-term current use of insulin (HCC) Well-controlled with A1c at 5.8. Effective management with Ozempic  and weight loss. - Continue Ozempic  1 mg weekly. - Discussed dose reduction/taper. She prefer to continue current dose to attain her weight goal of 120 lbs - Monitor weight and A1c levels. - Encourage having last meal of the day after 6 pm to prevent morning hypoglycemia - Urine Microalbumin checked Hyperlipidemia associated with type 2 diabetes mellitus (HCC) Managed with Zetia  and Crestor . Cholesterol testing not due until next year. - Continue Zetia  10 mg daily. - Continue Crestor  10 mg daily. Primary hypertension Well-controlled with current medication regimen. - Continue losartan  100 mg daily. Chronic back pain, unspecified back location, unspecified back pain laterality Chronic back pain Present but not currently severe. - Continue ibuprofen  as needed for back pain. Osteoarthritis of left shoulder, unspecified osteoarthritis type Chronic  shoulder pain due to osteoarthritis, exacerbated by cold weather. - Continue ibuprofen  as needed for shoulder pain. Vitamin D  deficiency Previous level of 16.4 ng/mL. Currently taking Creole oil caplets with vitamin D . - Continue Creole oil caplets daily. - Offered to get labs today. However, since most labs are due to Jan, she prefers to recheck vitamin D  levels in January  2026.    Otto Fairly, MD [facility]

## 2023-11-21 NOTE — Assessment & Plan Note (Addendum)
 Chronic back pain Present but not currently severe. - Continue ibuprofen  as needed for back pain.

## 2023-11-21 NOTE — Patient Instructions (Signed)
Pneumococcal Conjugate Vaccine: What You Need to Know Many vaccine information statements are available in Spanish and other languages. See PromoAge.com.br. 1. Why get vaccinated? Pneumococcal conjugate vaccine can prevent pneumococcal disease. Pneumococcal disease refers to any illness caused by pneumococcal bacteria. These bacteria can cause many types of illnesses, including pneumonia, which is an infection of the lungs. Pneumococcal bacteria are one of the most common causes of pneumonia. Besides pneumonia, pneumococcal bacteria can also cause: Ear infections Sinus infections Meningitis (infection of the tissue covering the brain and spinal cord) Bacteremia (infection of the blood) Anyone can get pneumococcal disease, but children under 13 years old, people with certain medical conditions or other risk factors, and adults 65 years or older are at the highest risk. Most pneumococcal infections are mild. However, some can result in long-term problems, such as brain damage or hearing loss. Meningitis, bacteremia, and pneumonia caused by pneumococcal disease can be fatal. 2. Pneumococcal conjugate vaccine Pneumococcal conjugate vaccine helps protect against bacteria that cause pneumococcal disease. There are three pneumococcal conjugate vaccines (PCV13, PCV15, and PCV20). The different vaccines are recommended for different people based on age and medical status. Your health care provider can help you determine which type of pneumococcal conjugate vaccine, and how many doses, you should receive. Infants and young children usually need 4 doses of pneumococcal conjugate vaccine. These doses are recommended at 2, 4, 6, and 58-29 months of age. Older children and adolescents might need pneumococcal conjugate vaccine depending on their age and medical conditions or other risk factors if they did not receive the recommended doses as infants or young children. Adults 19 through 11 years old with certain  medical conditions or other risk factors who have not already received pneumococcal conjugate vaccine should receive pneumococcal conjugate vaccine. Adults 65 years or older who have not previously received pneumococcal conjugate vaccine should receive pneumococcal conjugate vaccine. Some people with certain medical conditions are also recommended to receive pneumococcal polysaccharide vaccine (a different type of pneumococcal vaccine known as PPSV23). Some adults who have previously received a pneumococcal conjugate vaccine may be recommended to receive another pneumococcal conjugate vaccine. 3. Talk with your health care provider Tell your vaccination provider if the person getting the vaccine: Has had an allergic reaction after a previous dose of any type of pneumococcal conjugate vaccine (PCV13, PCV15, PCV20, or an earlier pneumococcal conjugate vaccine known as PCV7), or to any vaccine containing diphtheria toxoid (for example, DTaP), or has any severe, life-threatening allergies In some cases, your health care provider may decide to postpone pneumococcal conjugate vaccination until a future visit. People with minor illnesses, such as a cold, may be vaccinated. People who are moderately or severely ill should usually wait until they recover. Your health care provider can give you more information. 4. Risks of a vaccine reaction Redness, swelling, pain, or tenderness where the shot is given, and fever, loss of appetite, fussiness (irritability), feeling tired, headache, muscle aches, joint pain, and chills can happen after pneumococcal conjugate vaccination. Young children may be at increased risk for seizures caused by fever after a pneumococcal conjugate vaccine if it is administered at the same time as inactivated influenza vaccine. Ask your health care provider for more information. People sometimes faint after medical procedures, including vaccination. Tell your provider if you feel dizzy or  have vision changes or ringing in the ears. As with any medicine, there is a very remote chance of a vaccine causing a severe allergic reaction, other serious injury, or death. 5.  What if there is a serious problem? An allergic reaction could occur after the vaccinated person leaves the clinic. If you see signs of a severe allergic reaction (hives, swelling of the face and throat, difficulty breathing, a fast heartbeat, dizziness, or weakness), call 9-1-1 and get the person to the nearest hospital. For other signs that concern you, call your health care provider. Adverse reactions should be reported to the Vaccine Adverse Event Reporting System (VAERS). Your health care provider will usually file this report, or you can do it yourself. Visit the VAERS website at www.vaers.LAgents.no or call 913-439-3438. VAERS is only for reporting reactions, and VAERS staff members do not give medical advice. 6. The National Vaccine Injury Compensation Program The [employer] Vaccine Injury Compensation Program (VICP) is a federal program that was created to compensate people who may have been injured by certain vaccines. Claims regarding alleged injury or death due to vaccination have a time limit for filing, which may be as short as two years. Visit the VICP website at SpiritualWord.at or call 860 253 0829 to learn about the program and about filing a claim. 7. How can I learn more? Ask your health care provider. Call your local or state health department. Visit the website of the Food and Drug Administration (FDA) for vaccine package inserts and additional information at FinderList.no. Contact the Centers for Disease Control and Prevention (CDC): Call 4055360835 (1-800-CDC-INFO) or Visit CDC's website at PicCapture.uy. Source: CDC Vaccine Information Statement (Interim) Pneumococcal Conjugate Vaccine (05/15/2021) This same material is available at  FootballExhibition.com.br for no charge. This information is not intended to replace advice given to you by your health care provider. Make sure you discuss any questions you have with your health care provider. Document Revised: 04/07/2022 Document Reviewed: 01/11/2022 Elsevier Patient Education  2024 [employer].

## 2023-11-21 NOTE — Assessment & Plan Note (Addendum)
 Well-controlled with current medication regimen. - Continue losartan  100 mg daily.

## 2023-11-21 NOTE — Assessment & Plan Note (Addendum)
 Previous level of 16.4 ng/mL. Currently taking Creole oil caplets with vitamin D . - Continue Creole oil caplets daily. - Offered to get labs today. However, since most labs are due to Jan, she prefers to recheck vitamin D  levels in January 2026.

## 2023-11-22 ENCOUNTER — Ambulatory Visit: Payer: Self-pay | Admitting: Family Medicine

## 2023-11-22 LAB — MICROALBUMIN / CREATININE URINE RATIO
Creatinine, Urine: 43.3 mg/dL
Microalb/Creat Ratio: <7 mg/g{creat} (ref 0–29)
Microalbumin, Urine: <3 ug/mL

## 2023-11-22 MED ORDER — CONTOUR PLUS TEST VI STRP: ORAL_STRIP | 3 refills | Status: DC

## 2023-11-22 MED ORDER — CONTOUR PLUS BLUE W/DEVICE KIT: 1.0000 | PACK | Freq: Two times a day (BID) | 0 refills | Status: AC

## 2023-11-22 NOTE — Addendum Note (Signed)
 Addended by: ANDERS CUMINS T on: 11/22/2023 03:46 PM   Modules accepted: Orders

## 2023-11-23 ENCOUNTER — Other Ambulatory Visit: Payer: Self-pay | Admitting: Family Medicine

## 2023-11-25 ENCOUNTER — Other Ambulatory Visit: Payer: Self-pay

## 2023-11-25 ENCOUNTER — Encounter: Payer: Self-pay | Admitting: Family Medicine

## 2023-11-25 DIAGNOSIS — E1169 Type 2 diabetes mellitus with other specified complication: Secondary | ICD-10-CM

## 2023-11-25 MED ORDER — LANCETS MISC. KIT: 1.0000 | PACK | Freq: Two times a day (BID) | 0 refills | Status: DC

## 2023-11-25 MED ORDER — CONTOUR PLUS TEST VI STRP
ORAL_STRIP | 3 refills | Status: AC
Start: 2023-11-25 — End: ?

## 2023-11-25 MED ORDER — LANCETS MISC. KIT: 1.0000 | PACK | Freq: Two times a day (BID) | 0 refills | Status: AC

## 2023-11-28 ENCOUNTER — Telehealth: Payer: Self-pay

## 2023-11-28 NOTE — Telephone Encounter (Signed)
Spoke with patient. Informed of message left by Dr. Lum Babe. Patient understood. Aquilla Solian, CMA

## 2023-11-30 NOTE — Progress Notes (Signed)
 Betty Chapman                                          MRN: [id number]   11/30/2023   The [facility] Quality Team Specialist reviewed this patient medical record for the purposes of chart review for care gap closure. The following were reviewed: abstraction for care gap closure-kidney health evaluation for diabetes:eGFR  and uACR.    [facility] Quality Team

## 2023-12-03 ENCOUNTER — Other Ambulatory Visit: Payer: Self-pay | Admitting: Family Medicine

## 2023-12-08 ENCOUNTER — Other Ambulatory Visit: Payer: Self-pay | Admitting: Family Medicine

## 2023-12-08 MED ORDER — LANCET DEVICE MISC: 1 refills | Status: AC

## 2023-12-16 ENCOUNTER — Other Ambulatory Visit: Payer: Self-pay | Admitting: Family Medicine

## 2024-01-18 ENCOUNTER — Other Ambulatory Visit: Payer: Self-pay | Admitting: Family Medicine

## 2024-01-19 ENCOUNTER — Encounter: Payer: Self-pay | Admitting: Family Medicine

## 2024-01-20 ENCOUNTER — Encounter: Payer: Self-pay | Admitting: Family Medicine

## 2024-01-20 ENCOUNTER — Ambulatory Visit: Admitting: Family Medicine

## 2024-01-20 VITALS — BP 138/68 | HR 72 | Ht 59.0 in | Wt 129.6 lb

## 2024-01-20 DIAGNOSIS — E785 Hyperlipidemia, unspecified: Secondary | ICD-10-CM | POA: Diagnosis not present

## 2024-01-20 DIAGNOSIS — G8929 Other chronic pain: Secondary | ICD-10-CM | POA: Diagnosis not present

## 2024-01-20 DIAGNOSIS — E559 Vitamin D deficiency, unspecified: Secondary | ICD-10-CM | POA: Diagnosis not present

## 2024-01-20 DIAGNOSIS — M549 Dorsalgia, unspecified: Secondary | ICD-10-CM

## 2024-01-20 DIAGNOSIS — I10 Essential (primary) hypertension: Secondary | ICD-10-CM

## 2024-01-20 DIAGNOSIS — J449 Chronic obstructive pulmonary disease, unspecified: Secondary | ICD-10-CM

## 2024-01-20 DIAGNOSIS — E1169 Type 2 diabetes mellitus with other specified complication: Secondary | ICD-10-CM | POA: Diagnosis not present

## 2024-01-20 MED ORDER — PANTOPRAZOLE SODIUM 40 MG PO TBEC: 40.0000 mg | DELAYED_RELEASE_TABLET | Freq: Every day | ORAL | 1 refills | Status: AC

## 2024-01-20 NOTE — Assessment & Plan Note (Addendum)
 Weight decreased from 132 to 127 pounds. No adverse effects. Hyperlipidemia managed with Zetia  and Crestor . - Continue Ozempic  1 mg weekly. - Continue Zetia  10 mg daily. - Continue Crestor  10 mg daily. - Checked cholesterol levels.

## 2024-01-20 NOTE — Patient Instructions (Signed)
Vitamin D Deficiency Vitamin D deficiency is when your body does not have enough vitamin D. Vitamin D is important because: It helps your body use certain minerals. It helps to keep your bones healthy. It lessens irritation and swelling (inflammation). It helps the body's defense system (immune system) work better. Not getting enough vitamin D can make your bones soft. What are the causes? Not eating enough foods that have vitamin D in them. Not getting enough sun. Having diseases that make it hard for your body to take in vitamin D. Having had part of your stomach or part of your small intestine taken out. What increases the risk? Being an older adult. Not spending much time outdoors. Living in a long-term care center. Having dark skin. Taking certain medicines. Being overweight or very overweight (obese). Having long-term (chronic) kidney or liver disease. What are the signs or symptoms? In mild cases, there may be no symptoms. If the condition is very bad, symptoms may include: Bone pain. Muscle pain. Not being able to walk normally. Bones that break easily. Joint pain. How is this treated? Treatment may include taking supplements as told by your doctor. Your doctor will tell you what dose is best for you. This may include taking: Vitamin D. Calcium. Follow these instructions at home: Eating and drinking Eat foods that have vitamin D in them, such as: Dairy products, cereals, or juices that have vitamin D added to them (are fortified). Check the label. Fish, such as salmon or trout. Eggs. The vitamin D is in the yolk. Mushrooms that were treated with UV light. Beef liver. The items listed above may not be a complete list of foods and beverages you can eat and drink. Contact a dietitian for more information. General instructions Take over-the-counter and prescription medicines only as told by your doctor. Take supplements only as told by your doctor. Get sunlight in a  safe way. Do not use a tanning bed. Stay at a healthy weight. Lose weight if you need to. Keep all follow-up visits. How is this prevented? Eating foods that naturally have vitamin D in them. Eating or drinking foods and drinks that have vitamin D added to them, such as cereals, juices, and milk. Taking vitamin D or a multivitamin that has vitamin D in it. Being in the sun. Your body makes vitamin D when your skin gets sunlight. Contact a doctor if: Your symptoms do not go away. You feel like you may vomit (nauseous). You vomit. You poop less often than normal, or you have trouble pooping (constipation). Summary Vitamin D deficiency is when your body does not have enough vitamin D. Vitamin D helps to keep your bones healthy. This condition is often treated by taking a supplement. Your doctor will tell you what dose is best for you. This information is not intended to replace advice given to you by your health care provider. Make sure you discuss any questions you have with your health care provider. Document Revised: 09/26/2020 Document Reviewed: 09/26/2020 Elsevier Patient Education  2024 [employer].

## 2024-01-20 NOTE — Assessment & Plan Note (Addendum)
 Managed with albuterol  inhaler as needed and Dulera . Irregular use of Singulair  unless significant coughing occurs. - Continue albuterol  inhaler as needed. - Continue Dulera  200/5 mcg twice daily. - Encouraged regular use of Singulair  10 mg daily.

## 2024-01-20 NOTE — Progress Notes (Signed)
 "   SUBJECTIVE:   CHIEF COMPLAINT / HPI:   Discussed the use of AI scribe software for clinical note transcription with the patient, who gave verbal consent to proceed.  History of Present Illness   Betty Chapman is a 64 year old female who presents for lab tests and medication review.  She is here for a follow-up visit to check her vitamin D  levels and review her medications. She has a history of vitamin D  deficiency and has been taking over-the-counter vitamin D  supplements 400 IU QD.  She is unsure if she is taking the correct dosage and is awaiting lab results to confirm her vitamin D  levels. She was previously on Vit D 50000 IU but was to expensive for her.   Her current medication regimen includes albuterol  as needed, baby aspirin  every other day, baclofen  as needed, Zetia  10 mg daily, losartan  100 mg daily, Dulera  200/5 mcg twice daily, Singulair  10 mg as needed, Protonix  40 mg as needed, Crestor  10 mg daily, Spiriva , and vitamin D  supplements. She has difficulty remembering to take Singulair  unless experiencing significant coughing.  She reports a recent weight of 127 pounds, measured at home. She has been using Ozempic  1 mg weekly for weight management and diabetes. She has adjusted well to the medication, with initial nausea and vomiting subsiding after the first two months of use. Her weight decreased from 132 pounds in November 2025 to 127 pounds currently.  She recently retired on January 02, 2024, due to back and shoulder pain that limited her ability to perform tasks at home. For pain management, she uses ibuprofen  400 mg as needed and avoids stronger medications unless absolutely necessary.  Her daughter recently underwent open heart surgery and is currently hospitalized, which has been a source of significant stress and emotional strain for her. She manages her stress without additional medication despite feeling overwhelmed at times.       PERTINENT  PMH / PSH: PMHx  reviewed  OBJECTIVE:   BP 138/68   Pulse 72   Ht 4' 11 (1.499 m)   Wt 129 lb 9.6 oz (58.8 kg)   LMP 12/27/2011   SpO2 97%   BMI 26.18 kg/m   Physical Exam   MEASUREMENTS: Weight- 127. CHEST: Lungs clear to auscultation bilaterally, no wheeze. CARDIOVASCULAR: Heart regular rate and rhythm, no murmurs. ABDOMEN: Normal bowel sounds, no abdominal distention, no tenderness. EXTREMITIES: No leg swelling.       ASSESSMENT/PLAN:   Assessment & Plan Hyperlipidemia associated with type 2 diabetes mellitus (HCC) Weight decreased from 132 to 127 pounds. No adverse effects. Hyperlipidemia managed with Zetia  and Crestor . - Continue Ozempic  1 mg weekly. - Continue Zetia  10 mg daily. - Continue Crestor  10 mg daily. - Checked cholesterol levels. Vitamin D  deficiency Managed with vitamin D3 supplements. Vitamin D  levels checked today. - Continue supplement and we would adjust based on her vita D level - Provided information on dietary sources of vitamin D , including milk. Primary hypertension Managed with losartan  100 mg daily. - Continue losartan  100 mg daily. Chronic obstructive pulmonary disease, unspecified COPD type (HCC) Managed with albuterol  inhaler as needed and Dulera . Irregular use of Singulair  unless significant coughing occurs. - Continue albuterol  inhaler as needed. - Continue Dulera  200/5 mcg twice daily. - Encouraged regular use of Singulair  10 mg daily. Chronic back pain, unspecified back location, unspecified back pain laterality Managed with ibuprofen  400 mg as needed. Prefers to avoid stronger medications. 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 No acute bony abnormality. - Consider repeating xray at some point. - Continue ibuprofen  400 mg as needed for pain management.     Otto Fairly, MD [facility]  "

## 2024-01-20 NOTE — Assessment & Plan Note (Addendum)
 Managed with losartan  100 mg daily. - Continue losartan  100 mg daily.

## 2024-01-20 NOTE — Assessment & Plan Note (Addendum)
 Managed with ibuprofen  400 mg as needed. Prefers to avoid stronger medications. - Her lumbar xray in 2021 shows - Degenerative facet disease throughout the lumbar spine. No acute bony abnormality. - Consider repeating xray at some point. - Continue ibuprofen  400 mg as needed for pain management.

## 2024-01-20 NOTE — Assessment & Plan Note (Addendum)
 Managed with vitamin D3 supplements. Vitamin D  levels checked today. - Continue supplement and we would adjust based on her vita D level - Provided information on dietary sources of vitamin D , including milk.

## 2024-01-21 ENCOUNTER — Ambulatory Visit: Payer: Self-pay | Admitting: Family Medicine

## 2024-01-21 LAB — LIPID PANEL
Chol/HDL Ratio: 2.2 ratio (ref 0.0–4.4)
Cholesterol, Total: 129 mg/dL (ref 100–199)
HDL: 60 mg/dL
LDL Chol Calc (NIH): 55 mg/dL (ref 0–99)
Triglycerides: 69 mg/dL (ref 0–149)
VLDL Cholesterol Cal: 14 mg/dL (ref 5–40)

## 2024-01-21 LAB — CMP14+EGFR
ALT: 13 IU/L (ref 0–32)
AST: 12 IU/L (ref 0–40)
Albumin: 4.4 g/dL (ref 3.9–4.9)
Alkaline Phosphatase: 96 IU/L (ref 49–135)
BUN/Creatinine Ratio: 15 (ref 12–28)
BUN: 9 mg/dL (ref 8–27)
Bilirubin Total: 0.4 mg/dL (ref 0.0–1.2)
CO2: 21 mmol/L (ref 20–29)
Calcium: 9.6 mg/dL (ref 8.7–10.3)
Chloride: 107 mmol/L — ABNORMAL HIGH (ref 96–106)
Creatinine, Ser: 0.6 mg/dL (ref 0.57–1.00)
Globulin, Total: 2 g/dL (ref 1.5–4.5)
Glucose: 75 mg/dL (ref 70–99)
Potassium: 4.2 mmol/L (ref 3.5–5.2)
Sodium: 143 mmol/L (ref 134–144)
Total Protein: 6.4 g/dL (ref 6.0–8.5)
eGFR: 101 mL/min/1.73

## 2024-01-21 LAB — VITAMIN D 25 HYDROXY (VIT D DEFICIENCY, FRACTURES): Vit D, 25-Hydroxy: 16.5 ng/mL — ABNORMAL LOW (ref 30.0–100.0)

## 2024-01-21 MED ORDER — ERGOCALCIFEROL 1.25 MG (50000 UT) PO CAPS: 50000.0000 [IU] | ORAL_CAPSULE | ORAL | 1 refills | Status: AC

## 2024-01-30 ENCOUNTER — Other Ambulatory Visit: Payer: Self-pay | Admitting: Family Medicine

## 2024-01-30 DIAGNOSIS — E1169 Type 2 diabetes mellitus with other specified complication: Secondary | ICD-10-CM

## 2024-02-07 ENCOUNTER — Other Ambulatory Visit: Payer: Self-pay

## 2024-02-07 MED ORDER — OZEMPIC (1 MG/DOSE) 4 MG/3ML ~~LOC~~ SOPN: 1.0000 mg | PEN_INJECTOR | SUBCUTANEOUS | 1 refills | Status: AC

## 2024-02-09 ENCOUNTER — Other Ambulatory Visit: Payer: Self-pay | Admitting: Family Medicine

## 2024-09-17 ENCOUNTER — Encounter
